# Patient Record
Sex: Male | Born: 1965 | Race: White | Hispanic: No | Marital: Married | Smoking: Former smoker
Health system: Southern US, Community
[De-identification: ages and names within clinical notes are randomized; demographics above are authoritative.]

## PROBLEM LIST (undated history)

## (undated) DIAGNOSIS — K219 Gastro-esophageal reflux disease without esophagitis: Secondary | ICD-10-CM

## (undated) DIAGNOSIS — I2510 Atherosclerotic heart disease of native coronary artery without angina pectoris: Secondary | ICD-10-CM

## (undated) DIAGNOSIS — M5126 Other intervertebral disc displacement, lumbar region: Secondary | ICD-10-CM

## (undated) DIAGNOSIS — R0600 Dyspnea, unspecified: Secondary | ICD-10-CM

## (undated) DIAGNOSIS — J189 Pneumonia, unspecified organism: Secondary | ICD-10-CM

## (undated) HISTORY — PX: CARDIAC CATHETERIZATION: SHX172

## (undated) HISTORY — DX: Gastro-esophageal reflux disease without esophagitis: K21.9

## (undated) HISTORY — PX: TONSILLECTOMY: SUR1361

---

## 2005-09-29 ENCOUNTER — Emergency Department (HOSPITAL_COMMUNITY): Admission: EM | Admit: 2005-09-29 | Discharge: 2005-09-29 | Payer: Self-pay | Admitting: Emergency Medicine

## 2006-08-22 ENCOUNTER — Emergency Department (HOSPITAL_COMMUNITY): Admission: EM | Admit: 2006-08-22 | Discharge: 2006-08-22 | Payer: Self-pay | Admitting: Emergency Medicine

## 2010-06-17 NOTE — Consult Note (Signed)
NAME:  Erik [name], Erik [name] NO.:  [id number]   MEDICAL RECORD NO.:  [id number]          PATIENT TYPE:  EMS   LOCATION:  MINO                         FACILITY:  [facility]   PHYSICIAN:  Karol T. Lazarus Salines, M.D. DATE OF BIRTH:  1965/12/19   DATE OF CONSULTATION:  09/29/2005  DATE OF DISCHARGE:  09/29/2005                                   CONSULTATION   CHIEF COMPLAINT:  Tooth and chin pain and swelling.   HISTORY:  This 45 year old white male began developing some discomfort in a  left lower anterior tooth approximately 2 weeks ago.  He had some swelling  along the chin.  This seemed to settle down.  Then, the pain in the tooth  increased.  He saw his family dentist who put him on penicillin and  recommended he come back next week for evaluation.  Apparently she drilled  through the lingual surface to try to drain the tooth.  Over the past 5  days, he has had increasing pain, swelling in the submental area and  presented this morning to Dr. Garner Nash office in [place] who diagnosed an  abscess.  He was sent to [name] for evaluation at [facility] for possible  incision and drainage.  He has had previous bad teeth including abscesses  which required dental extraction.  He is not diabetic or otherwise immune  compromised.  No other teeth are recurrently bothering him.  No fever.  No  other active medical conditions.   PAST MEDICAL HISTORY:  No known allergies.   He is on penicillin and Vicodin.   SOCIAL HISTORY:  He is married.  He works as an Personnel officer.   FAMILY HISTORY:  Noncontributory.   REVIEW OF SYSTEMS:  Noncontributory.   PHYSICAL EXAMINATION:  This is a trim, somewhat distressed-appearing, middle-  aged white male.  He has visible swelling of the submental area.  No  thickness or swelling of the tongue or elevation of the floor of mouth.  Mental status is appropriate.  He hears well in conversational speech.  The  head is atraumatic and neck supple.  Cranial nerves  intact.  The oral cavity  is moist with a missing teeth and remaining teeth in fair to good repair.  He has some tender swelling in the anterior gingival buccal sulcus  inferiorly.  He has some fluctuant swelling in the submental region to the  left of midline.  No neck adenopathy.   X-RAYS:  A Panorex shows a periapical abscess in tooth #25, the left  mandibular canine.   CT scan shows a lucent abscess anterior to the mandible and a small defect  in the anterior plate of the mandibular bone consistent with erosion of the  abscess into the soft tissues.   IMPRESSION:  Odontogenic abscess, left submental from tooth #25.   PLAN:  I agree with the choice of penicillin.  He needs incision and  drainage today.  I will cover him with additional Vicodin as needed.   With informed consent, I anesthetized the gingival buccal sulcus with  Hurricaine spray, following which I performed  inferior mental nerve blocks  in each side with 6 mL total of  2% Xylocaine with 1:200,000 epinephrine.  After allowing sometime for this to work, additional of the same agent was  infiltrated around the abscess, and into the gingival buccal sulcus directly  beneath the tooth.  Several minutes were allowed for this to take effect.  A  sterile preparation and draping of the external chin was accomplished.   After ascertaining adequate anesthesia, a 1 cm incision into the abscess  pocket was performed.  Approximately 5 mL of frank pus was evacuated.  The  abscess was thoroughly explored up to the mandible, and all loculations were  not broken and expressed.  The patient tolerated this nicely.  A 1/4-inch  Penrose drain was placed in the depth of the abscess and secured to the skin  with a 3-0 silk stitch.  A small absorbent dressing was applied.   The patient is okay to go home.  He will call me in 2 days if he is not  making decent progress at which point it may be appropriate to switch him  from penicillin to  clindamycin.  He needs to contact his dentist immediately  after the holiday weekend for attention to the abscessed tooth.  He  understands and agrees with the discussion and plans.      Gloris Manchester. Lazarus Salines, M.D.  Electronically Signed     KTW/MEDQ  D:  09/29/2005  T:  10/01/2005  Job:  [id number]   cc:   Dr. Lyanne Co  Dr. Freida Busman, D.D.S., [facility]

## 2011-03-09 ENCOUNTER — Emergency Department (HOSPITAL_COMMUNITY)
Admission: EM | Admit: 2011-03-09 | Discharge: 2011-03-09 | Disposition: A | Discharge status: Home / Self Care | Payer: PRIVATE HEALTH INSURANCE | Attending: Emergency Medicine | Admitting: Emergency Medicine

## 2011-03-09 ENCOUNTER — Encounter (HOSPITAL_COMMUNITY): Payer: Self-pay | Admitting: Emergency Medicine

## 2011-03-09 DIAGNOSIS — Z202 Contact with and (suspected) exposure to infections with a predominantly sexual mode of transmission: Secondary | ICD-10-CM | POA: Insufficient documentation

## 2011-03-09 MED ORDER — METRONIDAZOLE 500 MG PO TABS
2000.0000 mg | ORAL_TABLET | Freq: Once | ORAL | Status: AC
  Administered 2011-03-09: 2000 mg via ORAL
  Filled 2011-03-09: qty 4

## 2011-03-09 NOTE — ED Provider Notes (Signed)
History     CSN: [id number]  Arrival date & time 03/09/11  4098   First MD Initiated Contact with Patient 03/09/11 1906      Chief Complaint  Patient presents with  . Exposure to STD    (Consider location/radiation/quality/duration/timing/severity/associated sxs/prior treatment) Patient is a 46 y.o. male presenting with STD exposure. The history is provided by the patient. No language interpreter was used.  Exposure to STD This is a new problem. Associated symptoms comments: Pt and his wife have been married for 21 years.  She was  Recently diagnosed with trichomonas.  He would like to be tested.  i offered the treatment only and he agrees he would prefer that..    History reviewed. No pertinent past medical history.  Past Surgical History  Procedure Date  . Tonsillectomy     No family history on file.  History  Substance Use Topics  . Smoking status: Current Everyday Smoker  . Smokeless tobacco: Not on file  . Alcohol Use: No      Review of Systems  Genitourinary: Negative for dysuria, urgency, frequency, hematuria, flank pain, decreased urine volume, discharge, penile swelling, scrotal swelling, genital sores, penile pain and testicular pain.  All other systems reviewed and are negative.    Allergies  Review of patient's allergies indicates no known allergies.  Home Medications  No current outpatient prescriptions on file.  BP 137/95  Pulse 84  Temp(Src) 98.4 F (36.9 C) (Oral)  Resp 18  SpO2 99%  Physical Exam  Constitutional: He is oriented to person, place, and time. He appears well-developed and well-nourished.  HENT:  Head: Normocephalic.  Eyes: EOM are normal.  Neck: Normal range of motion.  Pulmonary/Chest: Effort normal.  Genitourinary: Testes normal and penis normal. Circumcised. No phimosis, paraphimosis, hypospadias, penile erythema or penile tenderness. No discharge found.  Musculoskeletal: Normal range of motion.  Neurological: He is  alert and oriented to person, place, and time.  Psychiatric: He has a normal mood and affect.    ED Course  Procedures (including critical care time)  Labs Reviewed - No data to display No results found.   No diagnosis found.    MDM          Worthy Rancher, PA 03/09/11 1921

## 2011-03-09 NOTE — ED Notes (Signed)
Pt here for std check. Known exposure to trichomonas. Asymptomatic.

## 2011-03-12 NOTE — ED Provider Notes (Signed)
Medical screening examination/treatment/procedure(s) were performed by non-physician practitioner and as supervising physician I was immediately available for consultation/collaboration.  Nicoletta Dress. Colon Branch, MD 03/12/11 2152

## 2011-05-07 ENCOUNTER — Emergency Department (HOSPITAL_COMMUNITY)
Admission: EM | Admit: 2011-05-07 | Discharge: 2011-05-07 | Disposition: A | Discharge status: Home / Self Care | Payer: Worker's Compensation | Attending: Emergency Medicine | Admitting: Emergency Medicine

## 2011-05-07 ENCOUNTER — Encounter (HOSPITAL_COMMUNITY): Payer: Self-pay

## 2011-05-07 ENCOUNTER — Emergency Department (HOSPITAL_COMMUNITY): Payer: Worker's Compensation

## 2011-05-07 DIAGNOSIS — F17200 Nicotine dependence, unspecified, uncomplicated: Secondary | ICD-10-CM | POA: Insufficient documentation

## 2011-05-07 DIAGNOSIS — M79609 Pain in unspecified limb: Secondary | ICD-10-CM | POA: Insufficient documentation

## 2011-05-07 DIAGNOSIS — Y9269 Other specified industrial and construction area as the place of occurrence of the external cause: Secondary | ICD-10-CM | POA: Insufficient documentation

## 2011-05-07 DIAGNOSIS — S20229A Contusion of unspecified back wall of thorax, initial encounter: Secondary | ICD-10-CM | POA: Insufficient documentation

## 2011-05-07 DIAGNOSIS — R269 Unspecified abnormalities of gait and mobility: Secondary | ICD-10-CM | POA: Insufficient documentation

## 2011-05-07 DIAGNOSIS — M25559 Pain in unspecified hip: Secondary | ICD-10-CM | POA: Insufficient documentation

## 2011-05-07 DIAGNOSIS — W11XXXA Fall on and from ladder, initial encounter: Secondary | ICD-10-CM | POA: Insufficient documentation

## 2011-05-07 HISTORY — DX: Other intervertebral disc displacement, lumbar region: M51.26

## 2011-05-07 MED ORDER — OXYCODONE-ACETAMINOPHEN 5-325 MG PO TABS
1.0000 | ORAL_TABLET | Freq: Once | ORAL | Status: AC
  Administered 2011-05-07: 1 via ORAL
  Filled 2011-05-07: qty 1

## 2011-05-07 MED ORDER — IBUPROFEN 800 MG PO TABS: 800.0000 mg | ORAL_TABLET | Freq: Three times a day (TID) | ORAL | Status: AC

## 2011-05-07 MED ORDER — METHOCARBAMOL 500 MG PO TABS: ORAL_TABLET | ORAL | Status: DC

## 2011-05-07 MED ORDER — OXYCODONE-ACETAMINOPHEN 5-325 MG PO TABS: 1.0000 | ORAL_TABLET | ORAL | Status: AC | PRN

## 2011-05-07 NOTE — ED Notes (Signed)
Pt states has left leg and hip pain after a 4 ft fall from ladder while at work Tuesday (05-02-2011). Pain has increased over the past several days making sleep unbearable. Pt denies numbness and tingling in fore-mentioned leg.

## 2011-05-07 NOTE — ED Notes (Signed)
Was at work last Tuesday and fell app/ 4 feet off ladder, left hip/leg was sore and since Friday having severe pain, unable to sleep at night.

## 2011-05-07 NOTE — ED Provider Notes (Signed)
History     CSN: [id number]  Arrival date & time 05/07/11  0947   First MD Initiated Contact with Patient 05/07/11 [phone]      Chief Complaint  Patient presents with  . Leg Pain    (Consider location/radiation/quality/duration/timing/severity/associated sxs/prior treatment) HPI Comments: Patient complains fall on  04/26/11.  States injury occurred at work when he fell approx 4 ft, landed on his left hip and upper leg.  Pain with weight bearing and certain positions.  Denies numbness, weakness or incontinence of bowel or urine.    Patient is a 46 y.o. male presenting with leg pain. The history is provided by the patient.  Leg Pain  The incident occurred more than 2 days ago. The incident occurred at work. The injury mechanism was a fall. The pain is present in the left hip and left thigh (low back). The quality of the pain is described as aching. The pain is moderate. The pain has been constant since onset. Pertinent negatives include no numbness, no inability to bear weight, no loss of motion, no loss of sensation and no tingling. He reports no foreign bodies present. The symptoms are aggravated by activity, bearing weight and palpation. He has tried NSAIDs for the symptoms. The treatment provided mild relief.    Past Medical History  Diagnosis Date  . Ruptured lumbar intervertebral disc     Past Surgical History  Procedure Date  . Tonsillectomy     History reviewed. No pertinent family history.  History  Substance Use Topics  . Smoking status: Current Everyday Smoker    Types: Cigarettes  . Smokeless tobacco: Not on file  . Alcohol Use: No      Review of Systems  Constitutional: Negative for fever.  Gastrointestinal: Negative for vomiting and abdominal pain.  Genitourinary: Negative for dysuria, frequency, flank pain, decreased urine volume, difficulty urinating and testicular pain.  Musculoskeletal: Positive for back pain and gait problem. Negative for joint swelling.   Skin: Negative.   Neurological: Negative for tingling, weakness and numbness.  All other systems reviewed and are negative.    Allergies  Review of patient's allergies indicates no known allergies.  Home Medications   Current Outpatient Rx  Name Route Sig Dispense Refill  . IBUPROFEN 200 MG PO TABS Oral Take 400 mg by mouth as needed. For pain    . IBUPROFEN 800 MG PO TABS Oral Take 1 tablet (800 mg total) by mouth 3 (three) times daily. 21 tablet 0  . METHOCARBAMOL 500 MG PO TABS  Take two tabs po TID prn muscle spasms 42 tablet 0  . OXYCODONE-ACETAMINOPHEN 5-325 MG PO TABS Oral Take 1 tablet by mouth every 4 (four) hours as needed for pain. 20 tablet 0    BP 120/78  Pulse 83  Temp(Src) 97.7 F (36.5 C) (Oral)  Resp 20  Ht 5\' 8"  (1.727 m)  Wt 160 lb (72.576 kg)  BMI 24.33 kg/m2  SpO2 99%  Physical Exam  Nursing note and vitals reviewed. Constitutional: He is oriented to person, place, and time. He appears well-developed and well-nourished. No distress.  HENT:  Head: Normocephalic and atraumatic.  Mouth/Throat: Oropharynx is clear and moist.  Neck: Normal range of motion. Neck supple.  Cardiovascular: Normal rate, regular rhythm, normal heart sounds and intact distal pulses.   No murmur heard. Pulmonary/Chest: Effort normal and breath sounds normal. No respiratory distress.  Abdominal: Soft. He exhibits no distension. There is no tenderness.  Musculoskeletal: Normal range of motion. He exhibits  tenderness. He exhibits no edema.       Left hip: He exhibits tenderness and bony tenderness. He exhibits normal range of motion, normal strength, no swelling, no crepitus, no deformity and no laceration.       Legs:      ttp of the left lumbar paraspinal muscles and lateral left hip.  Mild pain with rotation of the hip.  dp pulse intact.  No knee pain.  Calf NT  Lymphadenopathy:    He has no cervical adenopathy.  Neurological: He is alert and oriented to person, place, and  time. No cranial nerve deficit or sensory deficit. He exhibits normal muscle tone. Coordination and gait normal.  Reflex Scores:      Patellar reflexes are 2+ on the right side and 2+ on the left side.      Achilles reflexes are 2+ on the right side and 2+ on the left side. Skin: Skin is warm and dry.    ED Course  Procedures (including critical care time)  Labs Reviewed - No data to display Dg Lumbar Spine Complete  05/07/2011  *RADIOLOGY REPORT*  Clinical Data: Status post fall from ladder.  Leg pain  LUMBAR SPINE - COMPLETE 4+ VIEW  Comparison: None  Findings:   There is a mild curvature of the lumbar spine which is convex to the right.  The vertebral body heights are well preserved.  There is disc space narrowing and ventral spurring at L4-5 and L5 S1.  There are no compression fractures identified.  IMPRESSION:  1.  No acute findings. 2.  Lumbar degenerative disc disease.  Original Report Authenticated By: Rosealee Albee, M.D.   Dg Hip Complete Left  05/07/2011  *RADIOLOGY REPORT*  Clinical Data: Leg pain post fall in March  LEFT HIP - COMPLETE 2+ VIEW  Comparison: None  Findings: Three views of the left hip submitted.  No acute fracture or subluxation.  Bilateral hip joints are symmetrical in appearance.  IMPRESSION: No acute fracture or subluxation.  Original Report Authenticated By: Natasha Mead, M.D.     1. Contusion, back       MDM    Patient has tenderness to palpation of the left lumbar paraspinal muscles and left lateral hip. Patient has full range of motion of the hip joint. He ambulates with a steady gait. No focal neuro deficits on his exam.    Patient / Family / Caregiver understand and agree with initial ED impression and plan with expectations set for ED visit. Pt stable in ED with no significant deterioration in condition. Pt feels improved after observation and/or treatment in ED.    Kaeden Mester L. [place], [place] 05/10/11 1634

## 2011-05-12 NOTE — ED Provider Notes (Signed)
Medical screening examination/treatment/procedure(s) were performed by non-physician practitioner and as supervising physician I was immediately available for consultation/collaboration.   Jameek Bruntz L Calistro Rauf, MD 05/12/11 1810 

## 2013-11-18 ENCOUNTER — Encounter (HOSPITAL_COMMUNITY): Payer: Self-pay | Admitting: Emergency Medicine

## 2013-11-18 ENCOUNTER — Emergency Department (HOSPITAL_COMMUNITY)
Admission: EM | Admit: 2013-11-18 | Discharge: 2013-11-18 | Disposition: A | Discharge status: Home / Self Care | Payer: BC Managed Care – PPO | Attending: Emergency Medicine | Admitting: Emergency Medicine

## 2013-11-18 DIAGNOSIS — Z720 Tobacco use: Secondary | ICD-10-CM | POA: Insufficient documentation

## 2013-11-18 DIAGNOSIS — R21 Rash and other nonspecific skin eruption: Secondary | ICD-10-CM | POA: Diagnosis present

## 2013-11-18 DIAGNOSIS — B86 Scabies: Secondary | ICD-10-CM | POA: Diagnosis not present

## 2013-11-18 MED ORDER — PERMETHRIN 5 % EX CREA: TOPICAL_CREAM | CUTANEOUS | Status: DC

## 2013-11-18 NOTE — ED Notes (Signed)
Scattered red itching rash for 1-2 weeks, Exposed to scabies

## 2013-11-18 NOTE — ED Provider Notes (Signed)
CSN: [id number]     Arrival date & time 11/18/13  1116 History  This chart was scribed for non-physician practitioner Lenard Galloway, working with Sharyon Cable, MD by Ludger Nutting, ED Scribe. This patient was seen in room APFT23/APFT23 and the patient's care was started at 11:58 AM.  .  Chief Complaint  Patient presents with  . Rash   The history is provided by the patient. No language interpreter was used.    HPI Comments: Erik [name] is a 48 y.o. male who presents to the Emergency Department complaining of 4-5 days of gradual onset, gradually worsened, constant rash to the lower extremities which improved and has now progressed to the back and upper extremities. He has associated itching. He has not tried any OTC medications for his symptoms. He denies fever.  Past Medical History  Diagnosis Date  . Ruptured lumbar intervertebral disc    Past Surgical History  Procedure Laterality Date  . Tonsillectomy     History reviewed. No pertinent family history. History  Substance Use Topics  . Smoking status: Current Every Day Smoker    Types: Cigarettes  . Smokeless tobacco: Not on file  . Alcohol Use: No    Review of Systems  Constitutional: Negative for fever.  Skin: Positive for rash.      Allergies  Review of patient's allergies indicates no known allergies.  Home Medications   Prior to Admission medications   Medication Sig Start Date End Date Taking? Authorizing Provider  ibuprofen (ADVIL,MOTRIN) 200 MG tablet Take 400 mg by mouth as needed. For pain    Historical Provider, MD  methocarbamol (ROBAXIN) 500 MG tablet Take two tabs po TID prn muscle spasms 05/07/11   Tammy L. Triplett, PA-C   BP 139/99  Pulse 69  Temp(Src) 98.2 F (36.8 C) (Oral)  Resp 18  Ht 5\' 9"  (1.753 m)  Wt 160 lb (72.576 kg)  BMI 23.62 kg/m2  SpO2 100% Physical Exam  Nursing note and vitals reviewed. Constitutional: He is oriented to person, place, and time. He appears  well-developed and well-nourished.  HENT:  Head: Normocephalic and atraumatic.  Cardiovascular: Normal rate.   Pulmonary/Chest: Effort normal.  Neurological: He is alert and oriented to person, place, and time.  Skin: Skin is warm and dry. Rash noted. Rash is maculopapular.  Maculopapular rash to back and LUE.  Psychiatric: He has a normal mood and affect.    ED Course  Procedures (including critical care time)  DIAGNOSTIC STUDIES: Oxygen Saturation is 20% on RA, normal by my interpretation.    COORDINATION OF CARE: 12:00 AM Will discharge home with permethrin cream. Discussed treatment plan with pt at bedside and pt agreed to plan.   Labs Review Labs Reviewed - No data to display  Imaging Review No results found.   EKG Interpretation None      MDM   Final diagnoses:  None    2. Scabies  Characteristic rash of scabies with known home exposure with family member. Will treat with permethrin and in home care instruction.  I personally performed the services described in this documentation, which was scribed in my presence. The recorded information has been reviewed and is accurate.     Dewaine Oats, PA-C 11/18/13 1514

## 2013-11-18 NOTE — Discharge Instructions (Signed)

## 2013-11-19 NOTE — ED Provider Notes (Signed)
Medical screening examination/treatment/procedure(s) were performed by non-physician practitioner and as supervising physician I was immediately available for consultation/collaboration.   EKG Interpretation None        Elyssa Pendelton W Jannie Doyle, MD 11/19/13 0718 

## 2017-04-05 ENCOUNTER — Encounter (HOSPITAL_COMMUNITY): Payer: Self-pay

## 2017-04-05 ENCOUNTER — Emergency Department (HOSPITAL_COMMUNITY): Payer: Self-pay

## 2017-04-05 ENCOUNTER — Emergency Department (HOSPITAL_COMMUNITY)
Admission: EM | Admit: 2017-04-05 | Discharge: 2017-04-05 | Disposition: A | Discharge status: Home / Self Care | Payer: Self-pay | Attending: Emergency Medicine | Admitting: Emergency Medicine

## 2017-04-05 DIAGNOSIS — S29011A Strain of muscle and tendon of front wall of thorax, initial encounter: Secondary | ICD-10-CM | POA: Insufficient documentation

## 2017-04-05 DIAGNOSIS — X509XXA Other and unspecified overexertion or strenuous movements or postures, initial encounter: Secondary | ICD-10-CM | POA: Insufficient documentation

## 2017-04-05 DIAGNOSIS — Y998 Other external cause status: Secondary | ICD-10-CM | POA: Insufficient documentation

## 2017-04-05 DIAGNOSIS — F17210 Nicotine dependence, cigarettes, uncomplicated: Secondary | ICD-10-CM | POA: Insufficient documentation

## 2017-04-05 DIAGNOSIS — R0781 Pleurodynia: Secondary | ICD-10-CM

## 2017-04-05 DIAGNOSIS — Y9389 Activity, other specified: Secondary | ICD-10-CM | POA: Insufficient documentation

## 2017-04-05 DIAGNOSIS — I2510 Atherosclerotic heart disease of native coronary artery without angina pectoris: Secondary | ICD-10-CM | POA: Insufficient documentation

## 2017-04-05 DIAGNOSIS — Y92810 Car as the place of occurrence of the external cause: Secondary | ICD-10-CM | POA: Insufficient documentation

## 2017-04-05 HISTORY — DX: Atherosclerotic heart disease of native coronary artery without angina pectoris: I25.10

## 2017-04-05 MED ORDER — METHOCARBAMOL 500 MG PO TABS: 500.0000 mg | ORAL_TABLET | Freq: Every evening | ORAL | 0 refills | Status: DC | PRN

## 2017-04-05 MED ORDER — CYCLOBENZAPRINE HCL 10 MG PO TABS
10.0000 mg | ORAL_TABLET | Freq: Once | ORAL | Status: AC
  Administered 2017-04-05: 10 mg via ORAL
  Filled 2017-04-05: qty 1

## 2017-04-05 MED ORDER — DICLOFENAC SODIUM 1 % TD GEL: 2.0000 g | Freq: Four times a day (QID) | TRANSDERMAL | 0 refills | Status: DC

## 2017-04-05 NOTE — ED Provider Notes (Signed)
Patient placed in Quick Look pathway, seen and evaluated   Chief Complaint: left rib pain  HPI:   52 y.o. male here today with pain in the left rib area that started one week ago. Patient reports that he was leaning over his toolbox on the back of his truck and felt a sudden severe pain. He states he did not hit the rib but felt the severe pain. Patient reports taking tylenol and ibuprofen but the pain has gotten worse and he has been unable to sleep due to the pain.   ROS: M/S: left rib pain  Physical Exam:  BP (!) 138/95 (BP Location: Right Arm)   Pulse 61   Temp (!) 97.5 F (36.4 C) (Oral)   Resp 14   Ht 5' 8.5" (1.74 m)   Wt 70.3 kg (155 lb)   SpO2 100%   BMI 23.22 kg/m    Gen: No distress  Neuro: Awake and Alert  Skin: Warm and dry, intact, no ecchymosis  Lungs: clear  M/S: tender with palpation to left posterior  ribs.     Focused Exam:    Initiation of care has begun. The patient has been counseled on the process, plan, and necessity for staying for the completion/evaluation, and the remainder of the medical screening examination    Ashley Murrain, NP 04/05/17 1419    Duffy Bruce, MD 04/06/17 [phone]

## 2017-04-05 NOTE — ED Provider Notes (Signed)
[facility] Provider Note   CSN: [id number] Arrival date & time: 04/05/17  1358     History   Chief Complaint No chief complaint on file.   HPI Erik [name] is a 52 y.o. male with past medical history significant for CAD and stent placement as well as rupture of lumbar intervertebral disc presenting with sudden onset left lower mid axillary rib nonradiating pain after reaching in the back of his trunk over a toolbox 1 week ago.  The pain is sharp and aggravated by movement alleviated slightly by rest, patient reports that the pain is relatively constant and worse when the is lying down to sleep.  He has tried ibuprofen and Tylenol without relief.  Denies any chest pain, shortness of breath, pleuritic pain, cough, history of DVT/PE, recent surgery, prolonged ambulation.   HPI  Past Medical History:  Diagnosis Date  . Coronary artery disease   . Ruptured lumbar intervertebral disc     There are no active problems to display for this patient.   Past Surgical History:  Procedure Laterality Date  . CARDIAC CATHETERIZATION     with stent placement  . TONSILLECTOMY         Home Medications    Prior to Admission medications   Medication Sig Start Date End Date Taking? Authorizing Provider  diclofenac sodium (VOLTAREN) 1 % GEL Apply 2 g topically 4 (four) times daily. 04/05/17   Avie Echevaria B, PA-C  ibuprofen (ADVIL,MOTRIN) 200 MG tablet Take 400 mg by mouth as needed. For pain    [provider]  methocarbamol (ROBAXIN) 500 MG tablet Take 1 tablet (500 mg total) by mouth at bedtime as needed. 04/05/17   Emeline General, PA-C  permethrin (ELIMITE) 5 % cream Apply from neck down at night and wash off in the morning 11/18/13   Charlann Lange, PA-C    Family History No family history on file.  Social History Social History   Tobacco Use  . Smoking status: Current Every Day Smoker    Types: Cigarettes  . Smokeless  tobacco: Never Used  Substance Use Topics  . Alcohol use: No  . Drug use: No     Allergies   Patient has no known allergies.   Review of Systems Review of Systems  Constitutional: Negative for chills, diaphoresis, fatigue and fever.  HENT: Negative for congestion.   Respiratory: Negative for cough, choking, chest tightness, shortness of breath, wheezing and stridor.   Cardiovascular: Negative for chest pain, palpitations and leg swelling.       Left mid axillary lower rib pain  Gastrointestinal: Negative for abdominal pain, diarrhea, nausea and vomiting.  Musculoskeletal: Negative for arthralgias, back pain, gait problem, joint swelling, myalgias, neck pain and neck stiffness.  Skin: Negative for color change, pallor, rash and wound.  Neurological: Negative for dizziness, syncope, weakness, light-headedness, numbness and headaches.     Physical Exam Updated Vital Signs BP (!) 138/95 (BP Location: Right Arm)   Pulse 61   Temp (!) 97.5 F (36.4 C) (Oral)   Resp 14   Ht 5' 8.5" (1.74 m)   Wt 70.3 kg (155 lb)   SpO2 100%   BMI 23.22 kg/m   Physical Exam  Constitutional: He appears well-developed and well-nourished. No distress.  Afebrile, nontoxic-appearing, sitting comfortably in chair in no acute distress.  HENT:  Head: Normocephalic and atraumatic.  Eyes: Conjunctivae and EOM are normal.  Neck: Normal range of motion.  Cardiovascular: Normal rate, regular  rhythm, normal heart sounds and intact distal pulses.  No murmur heard. Pulmonary/Chest: Effort normal and breath sounds normal. No stridor. No respiratory distress. He has no wheezes. He has no rales. He exhibits no tenderness.  Abdominal: He exhibits no distension.  Musculoskeletal: Normal range of motion. He exhibits tenderness. He exhibits no edema or deformity.  Tender to palpation in the intercostals of the left mid axillary 10th through 12th ribs  Neurological: He is alert. He exhibits normal muscle tone.    Skin: Skin is warm and dry. No rash noted. He is not diaphoretic. No erythema. No pallor.  Psychiatric: He has a normal mood and affect.  Nursing note and vitals reviewed.    ED Treatments / Results  Labs (all labs ordered are listed, but only abnormal results are displayed) Labs Reviewed - No data to display  EKG  EKG Interpretation None       Radiology Dg Chest 2 View  Result Date: 04/05/2017 CLINICAL DATA:  LEFT side rib pain for 1 week, felt a popping sensation when leaning over the truck bed last week, history smoking, coronary disease post coronary stenting and MI EXAM: CHEST - 2 VIEW COMPARISON:  None. FINDINGS: Minimal enlargement of cardiac silhouette. Atherosclerotic calcification aorta. Mediastinal contours and pulmonary vascularity normal. Mild bronchitic changes with slight chronic interstitial prominence, uncertain acuity. No segmental consolidation, pleural effusion or pneumothorax. Bones appear demineralized without acute osseous findings. Small bony excrescence identified at the proximal LEFT humerus question osteo chondroma. IMPRESSION: Minimal bronchitic changes and interstitial prominence of uncertain acuity. No acute LEFT rib abnormalities. Electronically Signed   By: Lavonia Dana M.D.   On: 04/05/2017 14:40    Procedures Procedures (including critical care time)  Medications Ordered in ED Medications  cyclobenzaprine (FLEXERIL) tablet 10 mg (10 mg Oral Given 04/05/17 1419)     Initial Impression / Assessment and Plan / ED Course  I have reviewed the triage vital signs and the nursing notes.  Pertinent labs & imaging results that were available during my care of the patient were reviewed by me and considered in my medical decision making (see chart for details).     Patient presenting with 1 week of left lower mid axillary rib pain after over reaching into his trunk.   Pain is reproducible on palpation and aggravated by movement.  No pleuritic component,  chest pain or shortness of breath.  100% SPO2 on room air.  Patient is not hypercoagulable and very low suspicion for PE as etiology of patient's symptoms.  Low suspicion for ACS, no chest pain or SOB and hx findings more consistent with msk etiology.,  No rash or concern for zoster  Chest xray without acute abnormalities.  No evidence of fracture, consolidation or pneumonia.  Patient is well-appearing, afebrile nontoxic.  Will discharge home with symptomatic relief and close follow-up with PCP.  Patient was provided with resources.  Discussed strict return precautions and advised to return to the emergency department if experiencing any new or worsening symptoms. Instructions were understood and patient agreed with discharge plan.  Final Clinical Impressions(s) / ED Diagnoses   Final diagnoses:  Rib pain on left side  Intercostal muscle strain, initial encounter    ED Discharge Orders        Ordered    methocarbamol (ROBAXIN) 500 MG tablet  At bedtime PRN     04/05/17 1718    diclofenac sodium (VOLTAREN) 1 % GEL  4 times daily     04/05/17 1720  Emeline General, PA-C 04/05/17 1733    Duffy Bruce, MD 04/06/17 [phone]

## 2017-04-05 NOTE — ED Triage Notes (Signed)
Pt states he was leaning over tool box on back of truck last Thursday and is having pain to left ribcage. Denies SOB.

## 2017-04-05 NOTE — Discharge Instructions (Signed)
As discussed, The medicine prescribed can help with muscle spasm but cannot be taken if driving, with alcohol or operating machinery.  You may take tylenol 1000 mg 4 times daily, make sure that you do not exceed 4000 mg of acetaminophen in a 24-hour period.  Acetaminophen is found and over-the-counter cold medications such as NyQuil, DayQuil and Alka-Seltzer among others. Apply diclofenac gel locally as needed.  Follow up with your Primary care provider or the wellness center if symptoms  persist beyond a week.  Return if worsening or new concerning symptoms in the meantime.

## 2017-04-05 NOTE — ED Notes (Signed)
See EDP secondary assessment.  

## 2019-11-25 ENCOUNTER — Emergency Department (HOSPITAL_COMMUNITY)
Admission: EM | Admit: 2019-11-25 | Discharge: 2019-11-25 | Disposition: A | Discharge status: Home / Self Care | Payer: Self-pay | Attending: Emergency Medicine | Admitting: Emergency Medicine

## 2019-11-25 ENCOUNTER — Emergency Department (HOSPITAL_COMMUNITY): Payer: Self-pay

## 2019-11-25 ENCOUNTER — Encounter (HOSPITAL_COMMUNITY): Payer: Self-pay

## 2019-11-25 ENCOUNTER — Other Ambulatory Visit: Payer: Self-pay

## 2019-11-25 DIAGNOSIS — I2510 Atherosclerotic heart disease of native coronary artery without angina pectoris: Secondary | ICD-10-CM | POA: Insufficient documentation

## 2019-11-25 DIAGNOSIS — F17210 Nicotine dependence, cigarettes, uncomplicated: Secondary | ICD-10-CM | POA: Insufficient documentation

## 2019-11-25 DIAGNOSIS — K219 Gastro-esophageal reflux disease without esophagitis: Secondary | ICD-10-CM | POA: Insufficient documentation

## 2019-11-25 DIAGNOSIS — R490 Dysphonia: Secondary | ICD-10-CM | POA: Insufficient documentation

## 2019-11-25 DIAGNOSIS — R059 Cough, unspecified: Secondary | ICD-10-CM | POA: Insufficient documentation

## 2019-11-25 LAB — CBC WITH DIFFERENTIAL/PLATELET
Abs Immature Granulocytes: 0.01 10*3/uL (ref 0.00–0.07)
Basophils Absolute: 0 10*3/uL (ref 0.0–0.1)
Basophils Relative: 1 %
Eosinophils Absolute: 0.2 10*3/uL (ref 0.0–0.5)
Eosinophils Relative: 3 %
HCT: 42 % (ref 39.0–52.0)
Hemoglobin: 14.1 g/dL (ref 13.0–17.0)
Immature Granulocytes: 0 %
Lymphocytes Relative: 28 %
Lymphs Abs: 2 10*3/uL (ref 0.7–4.0)
MCH: 31.8 pg (ref 26.0–34.0)
MCHC: 33.6 g/dL (ref 30.0–36.0)
MCV: 94.8 fL (ref 80.0–100.0)
Monocytes Absolute: 0.5 10*3/uL (ref 0.1–1.0)
Monocytes Relative: 7 %
Neutro Abs: 4.2 10*3/uL (ref 1.7–7.7)
Neutrophils Relative %: 61 %
Platelets: 338 10*3/uL (ref 150–400)
RBC: 4.43 MIL/uL (ref 4.22–5.81)
RDW: 13.2 % (ref 11.5–15.5)
WBC: 7 10*3/uL (ref 4.0–10.5)
nRBC: 0 % (ref 0.0–0.2)

## 2019-11-25 LAB — COMPREHENSIVE METABOLIC PANEL
ALT: 25 U/L (ref 0–44)
AST: 22 U/L (ref 15–41)
Albumin: 3.8 g/dL (ref 3.5–5.0)
Alkaline Phosphatase: 65 U/L (ref 38–126)
Anion gap: 10 (ref 5–15)
BUN: 18 mg/dL (ref 6–20)
CO2: 27 mmol/L (ref 22–32)
Calcium: 9.3 mg/dL (ref 8.9–10.3)
Chloride: 105 mmol/L (ref 98–111)
Creatinine, Ser: 0.75 mg/dL (ref 0.61–1.24)
GFR, Estimated: >60 mL/min (ref >60)
Glucose, Bld: 91 mg/dL (ref 70–99)
Potassium: 4 mmol/L (ref 3.5–5.1)
Sodium: 142 mmol/L (ref 135–145)
Total Bilirubin: 0.6 mg/dL (ref 0.3–1.2)
Total Protein: 7.2 g/dL (ref 6.5–8.1)

## 2019-11-25 LAB — LIPASE, BLOOD: Lipase: 47 U/L (ref 11–51)

## 2019-11-25 MED ORDER — OMEPRAZOLE 20 MG PO CPDR: 20.0000 mg | DELAYED_RELEASE_CAPSULE | Freq: Two times a day (BID) | ORAL | 1 refills | Status: DC

## 2019-11-25 MED ORDER — DOXYCYCLINE HYCLATE 100 MG PO CAPS: 100.0000 mg | ORAL_CAPSULE | Freq: Two times a day (BID) | ORAL | 0 refills | Status: DC

## 2019-11-25 NOTE — ED Triage Notes (Addendum)
Pt reports he had some nasal congestion back in July and since then his voice has become more and more hoarse. Pt states that more recently he has also started to have chest congestion and experiencing some heartburn. Denies chest pain and tightness.

## 2019-11-25 NOTE — Discharge Instructions (Addendum)
I suspect that your hoarse voice is due to uncontrolled GERD.  Please take the omeprazole, as directed.  I have also prescribed you doxycycline for a developing pneumonia visualized on chest x-ray.  It is vitally important that you get established with a primary care provider for ongoing evaluation and management of your health and wellbeing.  If your laryngitis fails to improve with GERD management, you will require further evaluation.  Return to the ED or seek immediate medical attention should you experience any new or worsening symptoms.

## 2019-11-25 NOTE — ED Provider Notes (Signed)
[facility] DEPT Provider Note   CSN: [id number] Arrival date & time: 11/25/19  4970     History Chief Complaint  Patient presents with  . Hoarse  . Nasal Congestion    Erik [name] is a 54 y.o. male with history of ACS s/p stent placement 4 years ago who presents the ED accompanied by his wife with a 9-month history of hoarseness.  Patient endorses a 35-pack-year smoking history, but does not see a primary care provider.  He states that he does not yet have medical insurance.  His wife states that he has been coughing worse as of lately.  He also states that he has been experiencing a 35-month history of central chest "burning" sensation that radiates from his epigastric towards his throat.  He denies any hemoptysis.  His wife remarks that he has had intermittent night sweats and mild weight loss.  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  Patient has not had a colonoscopy or any other health screenings.  He drinks alcohol rarely and does not taking medications unless absolutely necessary.  He denies any melena or other changes in bowel habits.  Denies abdominal pain, nausea or vomiting, or fevers.  No exertional chest pain or shortness of breath symptoms.  HPI     Past Medical History:  Diagnosis Date  . Coronary artery disease   . Ruptured lumbar intervertebral disc     There are no problems to display for this patient.   Past Surgical History:  Procedure Laterality Date  . CARDIAC CATHETERIZATION     with stent placement  . TONSILLECTOMY         History reviewed. No pertinent family history.  Social History   Tobacco Use  . Smoking status: Current Every Day Smoker    Types: Cigarettes  . Smokeless tobacco: Never Used  Substance Use Topics  . Alcohol use: No  . Drug use: No    Home Medications Prior to Admission medications   Medication Sig Start Date End Date Taking?  Authorizing Provider  diclofenac sodium (VOLTAREN) 1 % GEL Apply 2 g topically 4 (four) times daily. 04/05/17   Emeline General, PA-C  doxycycline (VIBRAMYCIN) 100 MG capsule Take 1 capsule (100 mg total) by mouth 2 (two) times daily. 11/25/19   Corena Herter, PA-C  ibuprofen (ADVIL,MOTRIN) 200 MG tablet Take 400 mg by mouth as needed. For pain    [provider]  methocarbamol (ROBAXIN) 500 MG tablet Take 1 tablet (500 mg total) by mouth at bedtime as needed. 04/05/17   Emeline General, PA-C  omeprazole (PRILOSEC) 20 MG capsule Take 1 capsule (20 mg total) by mouth 2 (two) times daily before a meal. 11/25/19 12/25/19  Nyoka Cowden, Rowe Clack, PA-C  permethrin (ELIMITE) 5 % cream Apply from neck down at night and wash off in the morning 11/18/13   Charlann Lange, PA-C    Allergies    Patient has no known allergies.  Review of Systems   Review of Systems  All other systems reviewed and are negative.   Physical Exam Updated Vital Signs BP 139/89   Pulse 62   Temp 98.1 F (36.7 C) (Oral)   Resp 18   SpO2 97%   Physical Exam Vitals and nursing note reviewed. Exam conducted with a chaperone present.  Constitutional:      General: He is not in acute distress.    Comments: Hoarse, mild cough.  HENT:     Head: Normocephalic and atraumatic.     Mouth/Throat:     Comments: Patent oropharynx.  No uvular deviation or erythema noted.  No exudates.  No trismus.  Tolerating secretions. Eyes:     General: No scleral icterus.    Conjunctiva/sclera: Conjunctivae normal.  Neck:     Comments: No obvious masses appreciated.  No supraclavicular lymphadenopathy. Cardiovascular:     Rate and Rhythm: Normal rate and regular rhythm.     Pulses: Normal pulses.     Heart sounds: Normal heart sounds.  Pulmonary:     Comments: Very mild increased work of breathing.  CTA bilaterally.  No tachypnea or distress.  Accessory muscle use. Abdominal:     General: Abdomen is flat. There is no  distension.     Palpations: Abdomen is soft.     Tenderness: There is no abdominal tenderness.  Musculoskeletal:     Cervical back: Normal range of motion and neck supple. No rigidity.  Skin:    General: Skin is dry.     Capillary Refill: Capillary refill takes less than 2 seconds.  Neurological:     Mental Status: He is alert and oriented to person, place, and time.     GCS: GCS eye subscore is 4. GCS verbal subscore is 5. GCS motor subscore is 6.  Psychiatric:        Mood and Affect: Mood normal.        Behavior: Behavior normal.        Thought Content: Thought content normal.     ED Results / Procedures / Treatments   Labs (all labs ordered are listed, but only abnormal results are displayed) Labs Reviewed  CBC WITH DIFFERENTIAL/PLATELET  COMPREHENSIVE METABOLIC PANEL  LIPASE, BLOOD    EKG None  Radiology DG Chest 2 View  Result Date: 11/25/2019 CLINICAL DATA:  Cough EXAM: CHEST - 2 VIEW COMPARISON:  April 05, 2017 FINDINGS: The heart size and mediastinal contours are within normal limits. Subtle opacities in the right lower lung on the frontal radiograph. No pleural effusions or pneumothorax. The visualized skeletal structures are unremarkable. Aortic atherosclerosis. IMPRESSION: Subtle opacities in the right lower lung on the frontal radiograph, which may represent early pneumonia. Electronically Signed   By: Margaretha Sheffield MD   On: 11/25/2019 09:23    Procedures Procedures (including critical care time)  Medications Ordered in ED Medications - No data to display  ED Course  I have reviewed the triage vital signs and the nursing notes.  Pertinent labs & imaging results that were available during my care of the patient were reviewed by me and considered in my medical decision making (see chart for details).    MDM Rules/Calculators/A&P                          Patient is endorsing a "chest burning" sensation that has been persistent x2 months.  He does not  take anything for GERD and his description of symptoms are consistent.  I suspect that this is contributing to his subacute/chronic laryngitis.  He continues to smoke a pack per day cigarettes.  He has a mild increased work of breathing, but denies any shortness of breath symptoms.  He is actively coughing throughout my examination and I suspect that he has COPD.  He does not take any medications regularly and does not have any outpatient support.  He is in the process of getting medical insurance through  his job as an Clinical biochemist.  I emphasized the importance of getting a PCP as he would likely be a good candidate for low-dose CT imaging of his chest given his 30+ pack year smoking history.  He also would benefit from an albuterol rescue inhaler and COPD work-up.  However, for the purpose of today's encounter, will obtain plain films of chest and basic laboratory work-up.  If it is all reassuring, he can be started on a proton pump inhibitor to see if that improves his symptoms.  CT soft tissue not emergently indicated. Discussed with Dr. Alvino Chapel who agrees with assessment and plan.    Laboratory work-up is personally reviewed and without any derangement.  Plain films are reviewed and demonstrates subtle opacities in the right lower lung on the frontal radiograph.  Given his worsening cough symptoms, will treat with doxycycline.  We will also place on Protonix 40 mg daily x3 months.  If his symptoms fail to improve with proton pump inhibitors, he will require further work-up for his subacute/chronic laryngitis.  ED return precautions discussed.  Patient voices understanding and is agreeable to the plan.    Final Clinical Impression(s) / ED Diagnoses Final diagnoses:  Hoarseness  Gastroesophageal reflux disease, unspecified whether esophagitis present    Rx / DC Orders ED Discharge Orders         Ordered    doxycycline (VIBRAMYCIN) 100 MG capsule  2 times daily        11/25/19 1033    omeprazole  (PRILOSEC) 20 MG capsule  2 times daily before meals        11/25/19 1033           Reita Chard 11/25/19 1034    Davonna Belling, MD 11/25/19 1358

## 2020-02-10 ENCOUNTER — Ambulatory Visit
Admission: EM | Admit: 2020-02-10 | Discharge: 2020-02-10 | Disposition: A | Discharge status: Home / Self Care | Payer: 59 | Attending: Internal Medicine | Admitting: Internal Medicine

## 2020-02-10 ENCOUNTER — Encounter: Payer: Self-pay | Admitting: Emergency Medicine

## 2020-02-10 ENCOUNTER — Other Ambulatory Visit: Payer: Self-pay

## 2020-02-10 DIAGNOSIS — R490 Dysphonia: Secondary | ICD-10-CM

## 2020-02-10 DIAGNOSIS — I10 Essential (primary) hypertension: Secondary | ICD-10-CM | POA: Diagnosis not present

## 2020-02-10 NOTE — Discharge Instructions (Signed)
It was nice to meet you today,  We have talked to the primary care office [place].  They will call you to schedule an appointment sometime today.  I have included the number below if you have not heard from them in a couple days.  I would reach out to them.  My concern is that a lot of your complaints could be due to chronic issues that require more extensive testing and diagnosis.  Based on exam and her previous chest x-ray he may have some undiagnosed COPD.  I would continue taking your Prilosec for now.  Address: [address], [place], [place] [address] Phone: [phone]

## 2020-02-10 NOTE — ED Triage Notes (Signed)
Per pt he has been having cough and congestion for quite some time, Pt said cough is nagging and hoarseness, Pt is a big smoker, pt said some SOB

## 2020-02-10 NOTE — ED Provider Notes (Signed)
[facility]    CSN: [id number] Arrival date & time: 02/10/20  [id number]      History   Chief Complaint Chief Complaint  Patient presents with  . Cough    HPI Mccray Angermeier is a 55 y.o. male.    Patient was seen in the ED approximately 3 months ago for chest tightness and hoarseness with cough.  He had an x-ray and blood work done.  He was diagnosed with GERD and given Prilosec.  The chest tightness resolved but he still having hoarseness.  He still has occasional cough that varies from excessive to mild on a day-to-day basis.  He has some sputum that is nonbloody.  He does not currently have a primary care provider.  He had a myocardial infarction in 2017 but has not followed up with a cardiologist.  He had 2 stents placed at the time.  He does not take any other medications.  Has no history of allergies.  He is a smoker of 1 to 1-1/2 packs/day for the past 35 years.     Past Medical History:  Diagnosis Date  . Coronary artery disease   . Ruptured lumbar intervertebral disc     There are no problems to display for this patient.   Past Surgical History:  Procedure Laterality Date  . CARDIAC CATHETERIZATION     with stent placement  . TONSILLECTOMY         Home Medications    Prior to Admission medications   Medication Sig Start Date End Date Taking? Authorizing Provider  diclofenac sodium (VOLTAREN) 1 % GEL Apply 2 g topically 4 (four) times daily. 04/05/17   Emeline General, PA-C  doxycycline (VIBRAMYCIN) 100 MG capsule Take 1 capsule (100 mg total) by mouth 2 (two) times daily. 11/25/19   Corena Herter, PA-C  ibuprofen (ADVIL,MOTRIN) 200 MG tablet Take 400 mg by mouth as needed. For pain    [provider]  methocarbamol (ROBAXIN) 500 MG tablet Take 1 tablet (500 mg total) by mouth at bedtime as needed. 04/05/17   Emeline General, PA-C  omeprazole (PRILOSEC) 20 MG capsule Take 1 capsule (20 mg total) by mouth 2 (two) times daily before a  meal. 11/25/19 12/25/19  Nyoka Cowden, Rowe Clack, PA-C  permethrin (ELIMITE) 5 % cream Apply from neck down at night and wash off in the morning 11/18/13   Charlann Lange, PA-C    Family History History reviewed. No pertinent family history.  Social History Social History   Tobacco Use  . Smoking status: Current Every Day Smoker    Types: Cigarettes  . Smokeless tobacco: Never Used  Substance Use Topics  . Alcohol use: No  . Drug use: No     Allergies   Patient has no known allergies.   Review of Systems Review of Systems  All other systems reviewed and are negative.    Physical Exam Triage Vital Signs ED Triage Vitals [02/10/20 1015]  Enc Vitals Group     BP (!) 176/88     Pulse Rate 97     Resp 18     Temp 98.6 F (37 C)     Temp Source Oral     SpO2 99 %     Weight      Height      Head Circumference      Peak Flow      Pain Score 0     Pain Loc      Pain Edu?  Excl. in [place]?    No data found.  Updated Vital Signs BP (!) 176/88 (BP Location: Right Arm)   Pulse 97   Temp 98.6 F (37 C) (Oral)   Resp 18   SpO2 99%   Visual Acuity Right Eye Distance:   Left Eye Distance:   Bilateral Distance:    Right Eye Near:   Left Eye Near:    Bilateral Near:     Physical Exam Vitals reviewed.  Constitutional:      General: He is not in acute distress.    Appearance: He is normal weight.  HENT:     Head: Normocephalic.     Nose: Nose normal. No congestion or rhinorrhea.     Mouth/Throat:     Mouth: Mucous membranes are moist.     Pharynx: Oropharynx is clear. No posterior oropharyngeal erythema.     Comments: Poor dentition with missing teeth and cavities Eyes:     Pupils: Pupils are equal, round, and reactive to light.  Cardiovascular:     Rate and Rhythm: Normal rate and regular rhythm.     Pulses: Normal pulses.     Heart sounds: Normal heart sounds. No murmur heard.   Pulmonary:     Breath sounds: Wheezing present.     Comments: Patient  with prolonged expiratory phase and diffuse mild wheezing bilaterally.  No areas of consolidation.  No egophony Musculoskeletal:     Cervical back: Normal range of motion.  Lymphadenopathy:     Cervical: No cervical adenopathy.  Skin:    General: Skin is warm and dry.  Neurological:     Mental Status: He is alert.      UC Treatments / Results  Labs (all labs ordered are listed, but only abnormal results are displayed) Labs Reviewed - No data to display  EKG   Radiology No results found.  Procedures Procedures (including critical care time)  Medications Ordered in UC Medications - No data to display  Initial Impression / Assessment and Plan / UC Course  I have reviewed the triage vital signs and the nursing notes.  Pertinent labs & imaging results that were available during my care of the patient were reviewed by me and considered in my medical decision making (see chart for details).    Patient has at least a 29-month history of hoarseness and cough.  Was diagnosed with GERD leading and has been taking Prilosec which has improved chest discomfort but did not help Cough or hoarseness.  Given his extensive smoking history and evaluation of her previous x-ray it appears he has undiagnosed COPD.  He also is hypertensive today at 381 systolic.  Cough may be due to chronic bronchitis/COPD.  Differential for his hoarseness is more broad including laryngeal reflux from GERD, laryngeal spasm, vocal cord polyp or mass.  Patient needs evaluation with endoscopy or ENT referral for vocal cord dysfunction, both of which he needs a PCP for.  Discussed with patient getting a PCP.  He has insurance.  Talk with the primary care office that we share a facility with him and they are willing to see him next week.  Advised patient to continue taking the Prilosec for now. Final Clinical Impressions(s) / UC Diagnoses   Final diagnoses:  Hoarseness  Essential hypertension     Discharge  Instructions     It was nice to meet you today,  We have talked to the primary care office next-door.  They will call you to schedule an appointment  sometime today.  I have included the number below if you have not heard from them in a couple days.  I would reach out to them.  My concern is that a lot of your complaints could be due to chronic issues that require more extensive testing and diagnosis.  Based on exam and her previous chest x-ray he may have some undiagnosed COPD.  I would continue taking your Prilosec for now.  Address: [address] [place], [place] [address] Phone: [phone]    ED Prescriptions    None     PDMP not reviewed this encounter.   Benay Pike, MD 02/10/20 1106

## 2020-02-23 NOTE — Progress Notes (Signed)
Subjective:    Erik [name] - 55 y.o. male MRN [id number]  Date of birth: [birth year]  HPI  Erik [name] is to establish care. Patient has no significant PMH.   Current issues and/or concerns: 1. GERD FOLLOW-UP: 02/10/2020: Visit at [facility] at [facility] Patient with at least a 69-month history of hoarseness and cough. Was diagnosed with GERD leading and was taking Prilosec which improved chest discomfort but did not help cough or hoarseness.  02/24/2020: Today reports he completed Prilosec, no longer having symptoms of acid reflux. Still having cough and hoarseness.   2. COUGH AND HOARSENESS: 02/10/2020: Visit at [facility] at [facility]. Given his extensive smoking history and evaluation of his previous x-ray it appears he has undiagnosed COPD. Differential for his hoarseness is more broad including laryngeal reflux from GERD, laryngeal spasm, vocal cord polyp or mass. Patient needs evaluation with endoscopy or ENT referral for vocal cord dysfunction.  02/24/2020: Duration: months Circumstances of initial development of cough: unknown Cough severity: moderate Cough description: non-productive, hacking and irritating, does occasionally cough up clear mucus  Aggravating factors:  nothing Alleviating factors: Nyquil  and Ibuprofen  Status:  stable Treatments attempted: Prilosec Wheezing: no Shortness of breath: no Chest pain: no Chest tightness:no Nasal congestion: no Runny nose: sometimes Frequent throat clearing or swallowing: yes Hemoptysis: no Fevers: no Night sweats: no Weight loss: no Heartburn: no Smoking 1 to 1.5 packs daily.  ROS per HPI   Health Maintenance:  Health Maintenance Due  Topic Date Due  . Hepatitis C Screening  Never done  . COVID-19 Vaccine (1) Never done  . HIV Screening  Never done  . TETANUS/TDAP  Never done  . COLONOSCOPY (Pts 45-90yrs Insurance coverage will need to be confirmed)  Never done    Past  Medical History: There are no problems to display for this patient.  Social History   reports that he has been smoking cigarettes. He has quit using smokeless tobacco.  His smokeless tobacco use included chew. He reports that he does not drink alcohol and does not use drugs.   Family History  family history is not on file.   Medications: reviewed and updated   Objective:   Physical Exam BP 134/85 (BP Location: Left Arm, Patient Position: Sitting)   Pulse 83   Ht 5' 7.72" (1.72 m)   Wt 129 lb 3.2 oz (58.6 kg)   SpO2 97%   BMI 19.81 kg/m  Physical Exam HENT:     Head: Normocephalic.  Eyes:     Extraocular Movements: Extraocular movements intact.     Pupils: Pupils are equal, round, and reactive to light.  Cardiovascular:     Rate and Rhythm: Normal rate and regular rhythm.     Pulses: Normal pulses.     Heart sounds: Normal heart sounds.  Pulmonary:     Effort: Pulmonary effort is normal.     Breath sounds: Wheezing present.     Comments: Mild wheezing left upper and left lower lung fields. Musculoskeletal:     Cervical back: Normal range of motion and neck supple.  Neurological:     General: No focal deficit present.     Mental Status: He is alert.  Psychiatric:        Mood and Affect: Mood normal.        Behavior: Behavior normal.     Assessment & Plan:  1. Encounter to establish care: - Patient presents today to establish care.  -  Return in 4 to 6 weeks or sooner if needed for annual physical examination, labs, and health maintenance. Arrive fasting meaning having had no food and/or nothing to drink for at least 8 hours prior to appointment.  2. Chronic cough: - Patient with chronic cough. Does have 30 pack-year smoking history. Recent x-ray and visit at Urgent Care with concern for undiagnosed COPD. - Benzonatate as prescribed for cough. - Referral to Pulmonology for further evaluation and management. - Follow-up with primary provider as scheduled. - Ambulatory  referral to Pulmonology - benzonatate ([name]) 100 MG capsule; Take 1 capsule (100 mg total) by mouth 3 (three) times daily as needed for cough.  Dispense: 20 capsule; Refill: 0  3. Chronic hoarseness: - Chronic hoarseness.  - Referral to ENT for further evaluation and management. - Ambulatory referral to ENT  4. Wheezing: - Stable in office today. Denies shortness of breath and chest pain.  - Albuterol inhaler as prescribed pending COPD workup.  - Follow-up with primary provider as scheduled. - albuterol (VENTOLIN HFA) 108 (90 Base) MCG/ACT inhaler; Inhale 2 puffs into the lungs every 6 (six) hours as needed for wheezing or shortness of breath.  Dispense: 8 g; Refill: 0  5. Encounter for screening for lung cancer: - Currently smoking 1 to 1.5 pack cigarettes daily. At least 30 pack year history.  - Lung cancer screening via CT chest.  - CT CHEST LUNG CA SCREEN LOW DOSE W/O CM; Future  6. Declined smoking cessation: - Patient not ready to quit just yet. - Counseled to quit.  Discussed health risk associated with smoking and cessation methods including NRT, Chantix, Buproprion.   7. Influenza vaccine refused: - Declined.  8. Tetanus, diphtheria, and acellular pertussis (Tdap) vaccination declined: - Declined.  Patient was given clear instructions to go to Emergency Department or return to medical center if symptoms don't improve, worsen, or new problems develop.The patient verbalized understanding.  [name], NP 02/24/2020, 1:37 PM Primary Care at [facility]

## 2020-02-24 ENCOUNTER — Other Ambulatory Visit: Payer: Self-pay

## 2020-02-24 ENCOUNTER — Encounter: Payer: Self-pay | Admitting: Family

## 2020-02-24 ENCOUNTER — Ambulatory Visit (INDEPENDENT_AMBULATORY_CARE_PROVIDER_SITE_OTHER): Payer: 59 | Admitting: Family

## 2020-02-24 VITALS — BP 134/85 | HR 83 | Ht 67.72 in | Wt 129.2 lb

## 2020-02-24 DIAGNOSIS — R490 Dysphonia: Secondary | ICD-10-CM | POA: Diagnosis not present

## 2020-02-24 DIAGNOSIS — Z7689 Persons encountering health services in other specified circumstances: Secondary | ICD-10-CM | POA: Diagnosis not present

## 2020-02-24 DIAGNOSIS — R062 Wheezing: Secondary | ICD-10-CM

## 2020-02-24 DIAGNOSIS — R053 Chronic cough: Secondary | ICD-10-CM | POA: Diagnosis not present

## 2020-02-24 DIAGNOSIS — Z122 Encounter for screening for malignant neoplasm of respiratory organs: Secondary | ICD-10-CM

## 2020-02-24 DIAGNOSIS — Z2821 Immunization not carried out because of patient refusal: Secondary | ICD-10-CM

## 2020-02-24 DIAGNOSIS — Z720 Tobacco use: Secondary | ICD-10-CM

## 2020-02-24 MED ORDER — BENZONATATE 100 MG PO CAPS: 100.0000 mg | ORAL_CAPSULE | Freq: Three times a day (TID) | ORAL | 0 refills | Status: DC | PRN

## 2020-02-24 MED ORDER — ALBUTEROL SULFATE HFA 108 (90 BASE) MCG/ACT IN AERS
2.0000 | INHALATION_SPRAY | Freq: Four times a day (QID) | RESPIRATORY_TRACT | 0 refills | Status: DC | PRN
Start: 2020-02-24 — End: 2020-04-13

## 2020-02-24 NOTE — Patient Instructions (Addendum)
Return in 4 to 6 weeks or sooner if needed for annual physical examination, labs, and health maintenance. Arrive fasting meaning having had no food and/or nothing to drink for at least 8 hours prior to appointment.  [name] for cough.   Referral to Pulmonology for chronic cough.   Referral to ENT for chronic hoarseness.  Thank you for choosing Primary Care at [facility] for your medical home!    Erik [name] was seen by Erik Herter, NP today.   [name] primary care provider is Erik Little [name], NP.   For the best care possible,  you should try to see Erik Fruits, NP whenever you come to clinic.   We look forward to seeing you again soon!  If you have any questions about your visit today,  please call us at [phone]  Or feel free to reach your provider via [place].    Chronic Obstructive Pulmonary Disease  Chronic obstructive pulmonary disease (COPD) is a long-term (chronic) lung problem. When you have COPD, it is hard for air to get in and out of your lungs. Usually the condition gets worse over time, and your lungs will never return to normal. There are things you can do to keep yourself as healthy as possible. What are the causes?  Smoking. This is the most common cause.  Certain genes passed from parent to child (inherited). What increases the risk?  Being exposed to secondhand smoke from cigarettes, pipes, or cigars.  Being exposed to chemicals and other irritants, such as fumes and dust in the work environment.  Having chronic lung conditions or infections. What are the signs or symptoms?  Shortness of breath, especially during physical activity.  A long-term cough with a large amount of thick mucus. Sometimes, the cough may not have any mucus (dry cough).  Wheezing.  Breathing quickly.  Skin that looks gray or blue, especially in the fingers, toes, or lips.  Feeling tired (fatigue).  Weight loss.  Chest tightness.  Having  infections often.  Episodes when breathing symptoms become much worse (exacerbations). At the later stages of this disease, you may have swelling in the ankles, feet, or legs. How is this treated?  Taking medicines.  Quitting smoking, if you smoke.  Rehabilitation. This includes steps to make your body work better. It may involve a team of specialists.  Doing exercises.  Making changes to your diet.  Using oxygen.  Lung surgery.  Lung transplant.  Comfort measures (palliative care). Follow these instructions at home: Medicines  Take over-the-counter and prescription medicines only as told by your doctor.  Talk to your doctor before taking any cough or allergy medicines. You may need to avoid medicines that cause your lungs to be dry. Lifestyle  If you smoke, stop smoking. Smoking makes the problem worse.  Do not smoke or use any products that contain nicotine or tobacco. If you need help quitting, ask your doctor.  Avoid being around things that make your breathing worse. This may include smoke, chemicals, and fumes.  Stay active, but remember to rest as well.  Learn and use tips on how to manage stress and control your breathing.  Make sure you get enough sleep. Most adults need at least 7 hours of sleep every night.  Eat healthy foods. Eat smaller meals more often. Rest before meals. Controlled breathing Learn and use tips on how to control your breathing as told by your doctor. Try:  Breathing in (inhaling) through your nose for  1 second. Then, pucker your lips and breath out (exhale) through your lips for 2 seconds.  Putting one hand on your belly (abdomen). Breathe in slowly through your nose for 1 second. Your hand on your belly should move out. Pucker your lips and breathe out slowly through your lips. Your hand on your belly should move in as you breathe out.   Controlled coughing Learn and use controlled coughing to clear mucus from your lungs. Follow these  steps: 1. Lean your head a little forward. 2. Breathe in deeply. 3. Try to hold your breath for 3 seconds. 4. Keep your mouth slightly open while coughing 2 times. 5. Spit any mucus out into a tissue. 6. Rest and do the steps again 1 or 2 times as needed. General instructions  Make sure you get all the shots (vaccines) that your doctor recommends. Ask your doctor about a flu shot and a pneumonia shot.  Use oxygen therapy and pulmonary rehabilitation if told by your doctor. If you need home oxygen therapy, ask your doctor if you should buy a tool to measure your oxygen level (oximeter).  Make a COPD action plan with your doctor. This helps you to know what to do if you feel worse than usual.  Manage any other conditions you have as told by your doctor.  Avoid going outside when it is very hot, cold, or humid.  Avoid people who have a sickness you can catch (contagious).  Keep all follow-up visits. Contact a doctor if:  You cough up more mucus than usual.  There is a change in the color or thickness of the mucus.  It is harder to breathe than usual.  Your breathing is faster than usual.  You have trouble sleeping.  You need to use your medicines more often than usual.  You have trouble doing your normal activities such as getting dressed or walking around the house. Get help right away if:  You have shortness of breath while resting.  You have shortness of breath that stops you from: ? Being able to talk. ? Doing normal activities.  Your chest hurts for longer than 5 minutes.  Your skin color is more blue than usual.  Your pulse oximeter shows that you have low oxygen for longer than 5 minutes.  You have a fever.  You feel too tired to breathe normally. These symptoms may represent a serious problem that is an emergency. Do not wait to see if the symptoms will go away. Get medical help right away. Call your local emergency services (911 in the [place] Do not drive  yourself to the hospital. Summary  Chronic obstructive pulmonary disease (COPD) is a long-term lung problem.  The way your lungs work will never return to normal. Usually the condition gets worse over time. There are things you can do to keep yourself as healthy as possible.  Take over-the-counter and prescription medicines only as told by your doctor.  If you smoke, stop. Smoking makes the problem worse. This information is not intended to replace advice given to you by your health care provider. Make sure you discuss any questions you have with your health care provider. Document Revised: 11/25/2019 Document Reviewed: 11/25/2019 Elsevier Patient Education  2021 [employer].

## 2020-02-24 NOTE — Progress Notes (Signed)
Establish care Hoarseness/cough going on for awhile  U/C visit provider stated that it could be attributed to [place] has been helping needs refill

## 2020-02-25 ENCOUNTER — Telehealth: Payer: Self-pay | Admitting: General Practice

## 2020-02-25 NOTE — Telephone Encounter (Signed)
Called and spoke with pt.  Pt has NOT had a covid vaccine.  Pt states he is NOT interested in getting one.

## 2020-03-01 ENCOUNTER — Telehealth: Payer: Self-pay

## 2020-03-01 NOTE — Telephone Encounter (Signed)
CPT Code inquiry from scheduling, please advise.

## 2020-03-03 ENCOUNTER — Ambulatory Visit (HOSPITAL_COMMUNITY): Payer: 59

## 2020-03-03 ENCOUNTER — Telehealth: Payer: Self-pay

## 2020-03-03 NOTE — Telephone Encounter (Signed)
Pt. called stating they are having an allergic reaction one of the medications. Please advice.

## 2020-03-04 ENCOUNTER — Emergency Department (HOSPITAL_COMMUNITY): Payer: 59

## 2020-03-04 ENCOUNTER — Encounter (HOSPITAL_COMMUNITY): Payer: Self-pay

## 2020-03-04 ENCOUNTER — Inpatient Hospital Stay (HOSPITAL_COMMUNITY)
Admission: EM | Admit: 2020-03-04 | Discharge: 2020-03-10 | DRG: 012 | Disposition: A | Discharge status: Home Health | Payer: 59 | Attending: Otolaryngology | Admitting: Otolaryngology

## 2020-03-04 ENCOUNTER — Other Ambulatory Visit: Payer: Self-pay

## 2020-03-04 DIAGNOSIS — Z20822 Contact with and (suspected) exposure to covid-19: Secondary | ICD-10-CM | POA: Diagnosis present

## 2020-03-04 DIAGNOSIS — I2510 Atherosclerotic heart disease of native coronary artery without angina pectoris: Secondary | ICD-10-CM | POA: Diagnosis present

## 2020-03-04 DIAGNOSIS — C329 Malignant neoplasm of larynx, unspecified: Principal | ICD-10-CM | POA: Diagnosis present

## 2020-03-04 DIAGNOSIS — C321 Malignant neoplasm of supraglottis: Secondary | ICD-10-CM | POA: Diagnosis present

## 2020-03-04 DIAGNOSIS — K219 Gastro-esophageal reflux disease without esophagitis: Secondary | ICD-10-CM | POA: Diagnosis present

## 2020-03-04 DIAGNOSIS — E440 Moderate protein-calorie malnutrition: Secondary | ICD-10-CM | POA: Diagnosis present

## 2020-03-04 DIAGNOSIS — Z955 Presence of coronary angioplasty implant and graft: Secondary | ICD-10-CM

## 2020-03-04 DIAGNOSIS — R64 Cachexia: Secondary | ICD-10-CM | POA: Diagnosis present

## 2020-03-04 DIAGNOSIS — J988 Other specified respiratory disorders: Secondary | ICD-10-CM

## 2020-03-04 DIAGNOSIS — F17210 Nicotine dependence, cigarettes, uncomplicated: Secondary | ICD-10-CM | POA: Diagnosis present

## 2020-03-04 DIAGNOSIS — Z681 Body mass index (BMI) 19 or less, adult: Secondary | ICD-10-CM | POA: Diagnosis not present

## 2020-03-04 HISTORY — DX: Dyspnea, unspecified: R06.00

## 2020-03-04 LAB — CBC WITH DIFFERENTIAL/PLATELET
Abs Immature Granulocytes: 0.02 10*3/uL (ref 0.00–0.07)
Basophils Absolute: 0 10*3/uL (ref 0.0–0.1)
Basophils Relative: 0 %
Eosinophils Absolute: 0.1 10*3/uL (ref 0.0–0.5)
Eosinophils Relative: 2 %
HCT: 42.5 % (ref 39.0–52.0)
Hemoglobin: 14 g/dL (ref 13.0–17.0)
Immature Granulocytes: 0 %
Lymphocytes Relative: 22 %
Lymphs Abs: 1.8 10*3/uL (ref 0.7–4.0)
MCH: 30.6 pg (ref 26.0–34.0)
MCHC: 32.9 g/dL (ref 30.0–36.0)
MCV: 92.8 fL (ref 80.0–100.0)
Monocytes Absolute: 0.7 10*3/uL (ref 0.1–1.0)
Monocytes Relative: 8 %
Neutro Abs: 5.7 10*3/uL (ref 1.7–7.7)
Neutrophils Relative %: 68 %
Platelets: 353 10*3/uL (ref 150–400)
RBC: 4.58 MIL/uL (ref 4.22–5.81)
RDW: 13.4 % (ref 11.5–15.5)
WBC: 8.4 10*3/uL (ref 4.0–10.5)
nRBC: 0 % (ref 0.0–0.2)

## 2020-03-04 LAB — BASIC METABOLIC PANEL
Anion gap: 10 (ref 5–15)
BUN: 21 mg/dL — ABNORMAL HIGH (ref 6–20)
CO2: 29 mmol/L (ref 22–32)
Calcium: 9.6 mg/dL (ref 8.9–10.3)
Chloride: 102 mmol/L (ref 98–111)
Creatinine, Ser: 0.83 mg/dL (ref 0.61–1.24)
GFR, Estimated: >60 mL/min (ref >60)
Glucose, Bld: 104 mg/dL — ABNORMAL HIGH (ref 70–99)
Potassium: 3.6 mmol/L (ref 3.5–5.1)
Sodium: 141 mmol/L (ref 135–145)

## 2020-03-04 LAB — SARS CORONAVIRUS 2 BY RT PCR (HOSPITAL ORDER, PERFORMED IN ~~LOC~~ HOSPITAL LAB): SARS Coronavirus 2: NEGATIVE

## 2020-03-04 MED ORDER — ALBUTEROL SULFATE HFA 108 (90 BASE) MCG/ACT IN AERS
6.0000 | INHALATION_SPRAY | Freq: Once | RESPIRATORY_TRACT | Status: AC
  Administered 2020-03-04: 6 via RESPIRATORY_TRACT
  Filled 2020-03-04: qty 6.7

## 2020-03-04 MED ORDER — IPRATROPIUM BROMIDE HFA 17 MCG/ACT IN AERS
5.0000 | INHALATION_SPRAY | Freq: Once | RESPIRATORY_TRACT | Status: AC
  Administered 2020-03-04: 5 via RESPIRATORY_TRACT
  Filled 2020-03-04: qty 12.9

## 2020-03-04 MED ORDER — ONDANSETRON HCL 4 MG PO TABS: 4.0000 mg | ORAL_TABLET | ORAL | Status: DC | PRN

## 2020-03-04 MED ORDER — RACEPINEPHRINE HCL 2.25 % IN NEBU
0.5000 mL | INHALATION_SOLUTION | Freq: Once | RESPIRATORY_TRACT | Status: AC
  Administered 2020-03-04: 0.5 mL via RESPIRATORY_TRACT
  Filled 2020-03-04: qty 0.5

## 2020-03-04 MED ORDER — METHYLPREDNISOLONE SODIUM SUCC 125 MG IJ SOLR
125.0000 mg | Freq: Once | INTRAMUSCULAR | Status: AC
  Administered 2020-03-04: 125 mg via INTRAVENOUS
  Filled 2020-03-04: qty 2

## 2020-03-04 MED ORDER — ONDANSETRON HCL 4 MG/2ML IJ SOLN: 4.0000 mg | INTRAMUSCULAR | Status: DC | PRN

## 2020-03-04 MED ORDER — ACETAMINOPHEN 160 MG/5ML PO SOLN: 650.0000 mg | ORAL | Status: DC | PRN

## 2020-03-04 MED ORDER — KCL IN DEXTROSE-NACL 20-5-0.45 MEQ/L-%-% IV SOLN
INTRAVENOUS | Status: DC
  Filled 2020-03-04 (×6): qty 1000

## 2020-03-04 MED ORDER — ACETAMINOPHEN 650 MG RE SUPP: 650.0000 mg | RECTAL | Status: DC | PRN

## 2020-03-04 MED ORDER — NICOTINE 21 MG/24HR TD PT24
21.0000 mg | MEDICATED_PATCH | Freq: Once | TRANSDERMAL | Status: AC
  Administered 2020-03-04: 21 mg via TRANSDERMAL
  Filled 2020-03-04: qty 1

## 2020-03-04 MED ORDER — IOHEXOL 300 MG/ML  SOLN
75.0000 mL | Freq: Once | INTRAMUSCULAR | Status: AC | PRN
  Administered 2020-03-04: 75 mL via INTRAVENOUS

## 2020-03-04 NOTE — ED Provider Notes (Signed)
[facility] DEPT Provider Note   CSN: [id number] Arrival date & time: 03/04/20  1149     History Chief Complaint  Patient presents with  . Sore Throat    Erik [name] is a 55 y.o. male with past medical history significant for CAD, GERD, tobacco abuse with 1ppd/35 years.  Did not have COVID vaccinations.  HPI Presents to emergency room today with chief complaint of sore throat and shortness of breath x1 week.  He states his symptoms have been progressively worsening over the last 6 months. He has been treated for GERD without symptom improvement.  He states he had significant swelling in his throat day of symptom onset that has improved however still present.  He feels like he has swelling in his throat that is worsening.  He states he has history of hoarseness however has never been this severe and he has not had swelling in the past.  He is also endorsing a productive cough.  He has not tried any medications for symptoms prior to arrival.  States he is having difficulty with p.o. intake secondary to symptoms.  Denies any known Covid exposures or sick contacts.  Denies weight loss, fatigue, fever, chills, congestion, chest pain, dental pain.   Past Medical History:  Diagnosis Date  . Coronary artery disease   . GERD (gastroesophageal reflux disease)    Phreesia 02/21/2020  . Ruptured lumbar intervertebral disc     There are no problems to display for this patient.   Past Surgical History:  Procedure Laterality Date  . CARDIAC CATHETERIZATION     with stent placement  . TONSILLECTOMY         No family history on file.  Social History   Tobacco Use  . Smoking status: Current Every Day Smoker    Types: Cigarettes  . Smokeless tobacco: Former Systems developer    Types: Secondary school teacher  . Vaping Use: Former  Substance Use Topics  . Alcohol use: No  . Drug use: No    Home Medications Prior to Admission medications   Medication Sig Start Date End  Date Taking? Authorizing Provider  albuterol (VENTOLIN HFA) 108 (90 Base) MCG/ACT inhaler Inhale 2 puffs into the lungs every 6 (six) hours as needed for wheezing or shortness of breath. 02/24/20   [name], NP  benzonatate ([name]) 100 MG capsule Take 1 capsule (100 mg total) by mouth 3 (three) times daily as needed for cough. 02/24/20   [name], NP  ibuprofen (ADVIL,MOTRIN) 200 MG tablet Take 400 mg by mouth as needed. For pain    [provider]  omeprazole (PRILOSEC) 20 MG capsule Take 1 capsule (20 mg total) by mouth 2 (two) times daily before a meal. 11/25/19 12/25/19  Corena Herter, PA-C    Allergies    Patient has no known allergies.  Review of Systems   Review of Systems All other systems are reviewed and are negative for acute change except as noted in the HPI.  Physical Exam Updated Vital Signs BP (!) 146/99   Pulse 97   Temp 98 F (36.7 C) (Oral)   Resp (!) 22   Ht 5\' 8"  (1.727 m)   Wt 59 kg   SpO2 100%   BMI 19.77 kg/m   Physical Exam Vitals and nursing note reviewed.  Constitutional:      General: He is not in acute distress.    Appearance: He is not ill-appearing.  HENT:  Head: Normocephalic and atraumatic.     Right Ear: External ear normal.     Left Ear: External ear normal.     Nose: Nose normal.     Mouth/Throat:     Mouth: Mucous membranes are moist. No angioedema.     Tongue: No lesions.     Pharynx: Oropharynx is clear. Uvula midline. No pharyngeal swelling, oropharyngeal exudate, posterior oropharyngeal erythema or uvula swelling.     Tonsils: No tonsillar exudate.     Comments: Overall poor dentition  Eyes:     General: No scleral icterus.       Right eye: No discharge.        Left eye: No discharge.     Extraocular Movements: Extraocular movements intact.     Conjunctiva/sclera: Conjunctivae normal.     Pupils: Pupils are equal, round, and reactive to light.  Neck:     Vascular: No JVD.    Cardiovascular:     Rate and Rhythm: Normal rate and regular rhythm.     Pulses: Normal pulses.          Radial pulses are 2+ on the right side and 2+ on the left side.     Heart sounds: Normal heart sounds.  Pulmonary:     Comments: Lungs clear to auscultation in all fields. Symmetric chest rise. No wheezing, rales, or rhonchi.   Patient is whispering to communicate.  Speaking in short sentences only 3-4 words. Inspiratory and expiratory wheezing heard. Patient in mild respiratory distress. No accessory muscle use. Oxygen saturation is 100 % on room air Abdominal:     Comments: Abdomen is soft, non-distended, and non-tender in all quadrants. No rigidity, no guarding. No peritoneal signs.  Musculoskeletal:        General: Normal range of motion.     Cervical back: Normal range of motion.  Skin:    General: Skin is warm and dry.     Capillary Refill: Capillary refill takes less than 2 seconds.  Neurological:     Mental Status: He is oriented to person, place, and time.     GCS: GCS eye subscore is 4. GCS verbal subscore is 5. GCS motor subscore is 6.     Comments: Fluent speech, no facial droop.  Psychiatric:        Behavior: Behavior normal.     ED Results / Procedures / Treatments   Labs (all labs ordered are listed, but only abnormal results are displayed) Labs Reviewed  BASIC METABOLIC PANEL - Abnormal; Notable for the following components:      Result Value   Glucose, Bld 104 (*)    BUN 21 (*)    All other components within normal limits  SARS CORONAVIRUS 2 BY RT PCR (HOSPITAL ORDER, [facility])  CBC WITH DIFFERENTIAL/PLATELET    EKG None  Radiology DG Chest 2 View  Result Date: 03/04/2020 CLINICAL DATA:  COPD exacerbation EXAM: CHEST - 2 VIEW COMPARISON:  11/25/2019 FINDINGS: The heart size and mediastinal contours are within normal limits. Atherosclerotic calcification of the aortic knob. Mildly hyperexpanded lungs. No focal airspace  consolidation, pleural effusion, or pneumothorax. The visualized skeletal structures are unremarkable. IMPRESSION: No active cardiopulmonary disease. Electronically Signed   By: Davina Poke D.O.   On: 03/04/2020 15:58   CT Soft Tissue Neck W Contrast  Result Date: 03/04/2020 CLINICAL DATA:  Soft tissue mass, neck, no prior imaging; smoker, hoarseness different than past, external neck swelling, patient reports sensation of  internal edema. Additional history provided: Patient reports sore throat and shortness of breath since last Thursday, throat swelling. EXAM: CT NECK WITH CONTRAST TECHNIQUE: Multidetector CT imaging of the neck was performed using the standard protocol following the bolus administration of intravenous contrast. CONTRAST:  17mL OMNIPAQUE IOHEXOL 300 MG/ML  SOLN COMPARISON:  Neck CT 09/29/2005. FINDINGS: Mildly motion degraded examination. Pharynx and larynx: Streak artifact from dental restoration partially obscures the oral cavity. No appreciable swelling or discrete mass within the oral cavity or pharynx. There is mucosal irregularity as well as mucosal/submucosal edema of the supraglottic and glottic larynx. Resultant mild-to-moderate narrowing and mild rightward deviation of the airway at this level. There is additional edema within the ventral neck. Salivary glands: No inflammation, mass, or stone. Thyroid: Unremarkable. Lymph nodes: Enlarged left level 2/3 lymph node measuring 14 mm in short axis (series 6, image 62) Vascular: The major vascular structures of the neck are patent. Aortic atherosclerosis. Minimal atherosclerotic plaque within the left carotid bifurcation and proximal ICA. Limited intracranial: No acute intracranial abnormality identified. Visualized orbits: Excluded from the field of view. Mastoids and visualized paranasal sinuses: Trace ethmoid sinus mucosal thickening at the imaged levels. No significant mastoid effusion. Skeleton: No acute bony abnormality or  aggressive osseous lesion. Upper chest: No consolidation within the imaged lung apices. Centrilobular emphysema. Other: Nonspecific 1.4 cm part cystic/part solid cutaneous/subcutaneous lesion within the midline upper neck (series 2, image 42). These results were called by telephone at the time of interpretation on 03/04/2020 at 2:48 pm to provider PA Sherol Dade , who verbally acknowledged these results. IMPRESSION: Mucosal irregularity and mucosal/submucosal edema of the supraglottic and glottic larynx. Primary differential considerations include infectious/inflammatory laryngeal edema versus laryngeal malignancy. ENT consultation and direct visualization recommended. Associated mild-to-moderate narrowing, and mild rightward deviation, of the airway at this level. Additional edema within the cutaneous/subcutaneous ventral neck. Mildly enlarged left level 2/3 lymph node which may be reactive or could reflect metastatic nodal disease. Nonspecific 1.4 cm part cystic/part solid cutaneous/subcutaneous lesion within the midline upper neck. Direct visualization recommended. Emphysema (ICD10-J43.9). Aortic Atherosclerosis (ICD10-I70.0). Electronically Signed   By: Kellie Simmering DO   On: 03/04/2020 14:48    Procedures Procedures   Medications Ordered in ED Medications  nicotine (NICODERM CQ - dosed in mg/24 hours) patch 21 mg (21 mg Transdermal Patch Applied 03/04/20 1601)  methylPREDNISolone sodium succinate (SOLU-MEDROL) 125 mg/2 mL injection 125 mg (125 mg Intravenous Given 03/04/20 1222)  albuterol (VENTOLIN HFA) 108 (90 Base) MCG/ACT inhaler 6 puff (6 puffs Inhalation Given 03/04/20 1222)  ipratropium (ATROVENT HFA) inhaler 5 puff (5 puffs Inhalation Given 03/04/20 1234)  Racepinephrine HCl 2.25 % nebulizer solution 0.5 mL (0.5 mLs Nebulization Given 03/04/20 1436)  iohexol (OMNIPAQUE) 300 MG/ML solution 75 mL (75 mLs Intravenous Contrast Given 03/04/20 1405)    ED Course  I have reviewed the triage vital  signs and the nursing notes.  Pertinent labs & imaging results that were available during my care of the patient were reviewed by me and considered in my medical decision making (see chart for details).    MDM Rules/Calculators/A&P                          History provided by patient with additional history obtained from chart review.    55 yo male presenting with hoarseness and shortness of breath. He is afebrile, HDS. No hypoxia or tachycardia. On exam he is in mild respiratory distress. He has  swelling over thyroid without tenderness or nodule felt. No angioedema or concerning oropharynx findings besides overall poor dentition.   Patient given albuterol and ipratropium inhalers to act similar to nebulizer  and solumedrol. Reassessed patient after inhalers and steroid and now there is stridor heard on exam.   CBC and BMP overall unremarkable.The patient was discussed with and seen by ED supervising physician Dr. Laverta Baltimore who agrees with the plan for imaging and adding racemic epinephrine. covid test is negative. CT soft tissue neck viewed by myself and attending and findings are concerning for laryngeal malignancy. Findings shows mucosal irregularity and mucosal/submucosal edema of the supraglottic and glottic larynx. Mildly enlarged left level 2/3 lymph node which may be reactive or could reflect metastatic nodal disease. Additional edema within the cutaneous/subcutaneous ventral neck. Associated mild-to-moderate narrowing, and mild rightward deviation, of the airway at this level.Radiologist comments on nonspecific 1.4 cm part cystic/part solid cutaneous/subcutaneous lesion within the midline upper neck.  Reassessed patient again after intervention and still no symptom improvement. Patient given nicotine patch. Consulted on call ENT provider Dr. Redmond Baseman who evaluated patient at the bedside with plan to admit for trach placement and biopsy tomorrow with high suspicion of laryngeal malignancy. ENT will  admit. This was shared visit with ED attending Dr. Laverta Baltimore. He personally saw and evaluated patient.   Portions of this note were generated with Lobby[employer]. Dictation errors may occur despite best attempts at proofreading.   Final Clinical Impression(s) / ED Diagnoses Final diagnoses:  Airway obstruction    Rx / DC Orders ED Discharge Orders    None       Lewanda Rife 03/04/20 2005    Long, Joshua G, MD 03/05/20 [phone]

## 2020-03-04 NOTE — ED Notes (Signed)
ED TO INPATIENT HANDOFF REPORT  ED Nurse Name and Phone #: [facility] Highland [phone]  S Name/Age/Gender Erik [name] 55 y.o. male Room/Bed: WTR7/WTR7  Code Status   Code Status: Not on file  Home/SNF/Other Home Patient oriented to: self, place, time and situation Is this baseline? 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 Reports recent throat swelling that has gone down. 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 Coronary artery disease   . GERD (gastroesophageal reflux disease)    Phreesia 02/21/2020  . Ruptured lumbar intervertebral disc    Past Surgical History:  Procedure Laterality Date  . CARDIAC CATHETERIZATION     with stent placement  . TONSILLECTOMY       A IV Location/Drains/Wounds Patient Lines/Drains/Airways Status    Active Line/Drains/Airways    Name Placement date Placement time Site Days   Peripheral IV 03/04/20 Right Antecubital 03/04/20  1217  Antecubital  less than 1          Intake/Output Last 24 hours No intake or output data in the 24 hours ending 03/04/20 2139  Labs/Imaging Results for orders placed or  performed during the hospital encounter of 03/04/20 (from the past 48 hour(s))  Basic metabolic panel     Status: Abnormal   Collection Time: 03/04/20 12:19 PM  Result Value Ref Range   Sodium 141 135 - 145 mmol/L   Potassium 3.6 3.5 - 5.1 mmol/L   Chloride 102 98 - 111 mmol/L   CO2 29 22 - 32 mmol/L   Glucose, Bld 104 (H) 70 - 99 mg/dL    Comment: Glucose reference range applies only to samples taken after fasting for at least 8 hours.   BUN 21 (H) 6 - 20 mg/dL   Creatinine, Ser 0.83 0.61 - 1.24 mg/dL   Calcium 9.6 8.9 - 10.3 mg/dL   GFR, Estimated >60 >60 mL/min    Comment: (NOTE) Calculated using the CKD-EPI Creatinine Equation (2021)    Anion gap 10 5 - 15    Comment: Performed at [facility], [place] [address] [place], [place] [address]  CBC with Differential     Status: None   Collection Time: 03/04/20 12:19 PM  Result Value Ref Range   WBC 8.4 4.0 - 10.5 K/uL   RBC 4.58 4.22 - 5.81 MIL/uL   Hemoglobin 14.0 13.0 - 17.0 g/dL   HCT 42.5 39.0 - 52.0 %   MCV 92.8 80.0 - 100.0 fL   MCH 30.6 26.0 - 34.0 pg   MCHC 32.9 30.0 - 36.0 g/dL   RDW 13.4 11.5 - 15.5 %   Platelets 353 150 - 400  K/uL   nRBC 0.0 0.0 - 0.2 %   Neutrophils Relative % 68 %   Neutro Abs 5.7 1.7 - 7.7 K/uL   Lymphocytes Relative 22 %   Lymphs Abs 1.8 0.7 - 4.0 K/uL   Monocytes Relative 8 %   Monocytes Absolute 0.7 0.1 - 1.0 K/uL   Eosinophils Relative 2 %   Eosinophils Absolute 0.1 0.0 - 0.5 K/uL   Basophils Relative 0 %   Basophils Absolute 0.0 0.0 - 0.1 K/uL   Immature Granulocytes 0 %   Abs Immature Granulocytes 0.02 0.00 - 0.07 K/uL    Comment: Performed at [facility], [place] [address] [place], [place] [address]  SARS Coronavirus 2 by RT PCR (hospital order, performed in [facility] hospital lab) Nasopharyngeal Nasopharyngeal Swab     Status: None   Collection Time: 03/04/20  1:52 PM   Specimen: Nasopharyngeal Swab  Result Value Ref Range   SARS Coronavirus 2  NEGATIVE NEGATIVE    Comment: (NOTE) SARS-CoV-2 target nucleic acids are NOT DETECTED.  The SARS-CoV-2 RNA is generally detectable in upper and lower respiratory specimens during the acute phase of infection. The lowest concentration of SARS-CoV-2 viral copies this assay can detect is 250 copies / mL. A negative result does not preclude SARS-CoV-2 infection and should not be used as the sole basis for treatment or other patient management decisions.  A negative result may occur with improper specimen collection / handling, submission of specimen other than nasopharyngeal swab, presence of viral mutation(s) within the areas targeted by this assay, and inadequate number of viral copies (<250 copies / mL). A negative result must be combined with clinical observations, patient history, and epidemiological information.  Fact Sheet for Patients:   StrictlyIdeas.no  Fact Sheet for Healthcare Providers: BankingDealers.co.za  This test is not yet approved or  cleared by the [place] FDA and has been authorized for detection and/or diagnosis of SARS-CoV-2 by FDA under an Emergency Use Authorization (EUA).  This EUA will remain in effect (meaning this test can be used) for the duration of the COVID-19 declaration under Section 564(b)(1) of the Act, 21 U.S.C. section 360bbb-3(b)(1), unless the authorization is terminated or revoked sooner.  Performed at [facility], [place] [address], [place], [place] [address]    DG Chest 2 View  Result Date: 03/04/2020 CLINICAL DATA:  COPD exacerbation EXAM: CHEST - 2 VIEW COMPARISON:  11/25/2019 FINDINGS: The heart size and mediastinal contours are within normal limits. Atherosclerotic calcification of the aortic knob. Mildly hyperexpanded lungs. No focal airspace consolidation, pleural effusion, or pneumothorax. The visualized skeletal structures are unremarkable. IMPRESSION: No active  cardiopulmonary disease. Electronically Signed   By: Davina Poke D.O.   On: 03/04/2020 15:58   CT Soft Tissue Neck W Contrast  Result Date: 03/04/2020 CLINICAL DATA:  Soft tissue mass, neck, no prior imaging; smoker, hoarseness different than past, external neck swelling, patient reports sensation of internal edema. Additional history provided: Patient reports sore throat and shortness of breath since last Thursday, throat swelling. EXAM: CT NECK WITH CONTRAST TECHNIQUE: Multidetector CT imaging of the neck was performed using the standard protocol following the bolus administration of intravenous contrast. CONTRAST:  87mL OMNIPAQUE IOHEXOL 300 MG/ML  SOLN COMPARISON:  Neck CT 09/29/2005. FINDINGS: Mildly motion degraded examination. Pharynx and larynx: Streak artifact from dental restoration partially obscures the oral cavity. No appreciable swelling or discrete mass within the oral cavity or pharynx. There is mucosal irregularity as well as mucosal/submucosal  edema of the supraglottic and glottic larynx. Resultant mild-to-moderate narrowing and mild rightward deviation of the airway at this level. There is additional edema within the ventral neck. Salivary glands: No inflammation, mass, or stone. Thyroid: Unremarkable. Lymph nodes: Enlarged left level 2/3 lymph node measuring 14 mm in short axis (series 6, image 62) Vascular: The major vascular structures of the neck are patent. Aortic atherosclerosis. Minimal atherosclerotic plaque within the left carotid bifurcation and proximal ICA. Limited intracranial: No acute intracranial abnormality identified. Visualized orbits: Excluded from the field of view. Mastoids and visualized paranasal sinuses: Trace ethmoid sinus mucosal thickening at the imaged levels. No significant mastoid effusion. Skeleton: No acute bony abnormality or aggressive osseous lesion. Upper chest: No consolidation within the imaged lung apices. Centrilobular emphysema. Other: Nonspecific  1.4 cm part cystic/part solid cutaneous/subcutaneous lesion within the midline upper neck (series 2, image 42). These results were called by telephone at the time of interpretation on 03/04/2020 at 2:48 pm to provider PA Sherol Dade , who verbally acknowledged these results. IMPRESSION: Mucosal irregularity and mucosal/submucosal edema of the supraglottic and glottic larynx. Primary differential considerations include infectious/inflammatory laryngeal edema versus laryngeal malignancy. ENT consultation and direct visualization recommended. Associated mild-to-moderate narrowing, and mild rightward deviation, of the airway at this level. Additional edema within the cutaneous/subcutaneous ventral neck. Mildly enlarged left level 2/3 lymph node which may be reactive or could reflect metastatic nodal disease. Nonspecific 1.4 cm part cystic/part solid cutaneous/subcutaneous lesion within the midline upper neck. Direct visualization recommended. Emphysema (ICD10-J43.9). Aortic Atherosclerosis (ICD10-I70.0). Electronically Signed   By: Kellie Simmering DO   On: 03/04/2020 14:48    Pending Labs Unresulted Labs (From admission, onward)         None      Vitals/Pain Today's Vitals   03/04/20 1437 03/04/20 1500 03/04/20 1630 03/04/20 1800  BP:  (!) 142/90 136/88 126/85  Pulse: 69 80 95 80  Resp: 15 18 17 17   Temp:      TempSrc:      SpO2: 100% 100% 100% 99%  Weight:      Height:      PainSc:        Isolation Precautions No active isolations  Medications Medications  nicotine (NICODERM CQ - dosed in mg/24 hours) patch 21 mg (21 mg Transdermal Patch Applied 03/04/20 1601)  methylPREDNISolone sodium succinate (SOLU-MEDROL) 125 mg/2 mL injection 125 mg (125 mg Intravenous Given 03/04/20 1222)  albuterol (VENTOLIN HFA) 108 (90 Base) MCG/ACT inhaler 6 puff (6 puffs Inhalation Given 03/04/20 1222)  ipratropium (ATROVENT HFA) inhaler 5 puff (5 puffs Inhalation Given 03/04/20 1234)  Racepinephrine HCl 2.25 %  nebulizer solution 0.5 mL (0.5 mLs Nebulization Given 03/04/20 1436)  iohexol (OMNIPAQUE) 300 MG/ML solution 75 mL (75 mLs Intravenous Contrast Given 03/04/20 1405)    Mobility walks Low fall risk   Focused Assessments Pulmonary Assessment Handoff:  Lung sounds: Bilateral Breath Sounds: Expiratory wheezes (UPPER airway/Throat) O2 Device: Room Air        R Recommendations: See Admitting Provider Note  Report given to: Mortimer Fries RN  Additional Notes:

## 2020-03-04 NOTE — H&P (Signed)
Erik [name] is an 55 y.o. male.   Chief Complaint:  Difficulty breathing HPI: 55 year old male with six months of worsening hoarseness and throat pain previously treated presumptively for reflux but not really improving.  For the past week, he has had worsening difficulty breathing and came to the ER for this reason where he has been treated with steroids and racemic epinephrine without improvement.  A neck CT scan demonstrates abnormal soft tissue in the larynx with partial airway obstruction.  Consultation was requested.  He denies significant throat pain or difficulty swallowing.  Past Medical History:  Diagnosis Date  . Coronary artery disease   . GERD (gastroesophageal reflux disease)    Phreesia 02/21/2020  . Ruptured lumbar intervertebral disc     Past Surgical History:  Procedure Laterality Date  . CARDIAC CATHETERIZATION     with stent placement  . TONSILLECTOMY      No family history on file. Social History:  reports that he has been smoking cigarettes. He has quit using smokeless tobacco.  His smokeless tobacco use included chew. He reports that he does not drink alcohol and does not use drugs.  Allergies: No Known Allergies  (Not in a hospital admission)   Results for orders placed or performed during the hospital encounter of 03/04/20 (from the past 48 hour(s))  Basic metabolic panel     Status: Abnormal   Collection Time: 03/04/20 12:19 PM  Result Value Ref Range   Sodium 141 135 - 145 mmol/L   Potassium 3.6 3.5 - 5.1 mmol/L   Chloride 102 98 - 111 mmol/L   CO2 29 22 - 32 mmol/L   Glucose, Bld 104 (H) 70 - 99 mg/dL    Comment: Glucose reference range applies only to samples taken after fasting for at least 8 hours.   BUN 21 (H) 6 - 20 mg/dL   Creatinine, Ser 0.83 0.61 - 1.24 mg/dL   Calcium 9.6 8.9 - 10.3 mg/dL   GFR, Estimated >60 >60 mL/min    Comment: (NOTE) Calculated using the CKD-EPI Creatinine Equation (2021)    Anion gap 10 5 - 15    Comment:  Performed at [facility], [place] [address] [place], [place] [address]  CBC with Differential     Status: None   Collection Time: 03/04/20 12:19 PM  Result Value Ref Range   WBC 8.4 4.0 - 10.5 K/uL   RBC 4.58 4.22 - 5.81 MIL/uL   Hemoglobin 14.0 13.0 - 17.0 g/dL   HCT 42.5 39.0 - 52.0 %   MCV 92.8 80.0 - 100.0 fL   MCH 30.6 26.0 - 34.0 pg   MCHC 32.9 30.0 - 36.0 g/dL   RDW 13.4 11.5 - 15.5 %   Platelets 353 150 - 400 K/uL   nRBC 0.0 0.0 - 0.2 %   Neutrophils Relative % 68 %   Neutro Abs 5.7 1.7 - 7.7 K/uL   Lymphocytes Relative 22 %   Lymphs Abs 1.8 0.7 - 4.0 K/uL   Monocytes Relative 8 %   Monocytes Absolute 0.7 0.1 - 1.0 K/uL   Eosinophils Relative 2 %   Eosinophils Absolute 0.1 0.0 - 0.5 K/uL   Basophils Relative 0 %   Basophils Absolute 0.0 0.0 - 0.1 K/uL   Immature Granulocytes 0 %   Abs Immature Granulocytes 0.02 0.00 - 0.07 K/uL    Comment: Performed at [facility], [place] [address], [place], [place] [address]  SARS Coronavirus 2 by RT  PCR (hospital order, performed in [facility] hospital lab) Nasopharyngeal Nasopharyngeal Swab     Status: None   Collection Time: 03/04/20  1:52 PM   Specimen: Nasopharyngeal Swab  Result Value Ref Range   SARS Coronavirus 2 NEGATIVE NEGATIVE    Comment: (NOTE) SARS-CoV-2 target nucleic acids are NOT DETECTED.  The SARS-CoV-2 RNA is generally detectable in upper and lower respiratory specimens during the acute phase of infection. The lowest concentration of SARS-CoV-2 viral copies this assay can detect is 250 copies / mL. A negative result does not preclude SARS-CoV-2 infection and should not be used as the sole basis for treatment or other patient management decisions.  A negative result may occur with improper specimen collection / handling, submission of specimen other than nasopharyngeal swab, presence of viral mutation(s) within the areas targeted by this assay, and inadequate number of viral  copies (<250 copies / mL). A negative result must be combined with clinical observations, patient history, and epidemiological information.  Fact Sheet for Patients:   StrictlyIdeas.no  Fact Sheet for Healthcare Providers: BankingDealers.co.za  This test is not yet approved or  cleared by the [place] FDA and has been authorized for detection and/or diagnosis of SARS-CoV-2 by FDA under an Emergency Use Authorization (EUA).  This EUA will remain in effect (meaning this test can be used) for the duration of the COVID-19 declaration under Section 564(b)(1) of the Act, 21 U.S.C. section 360bbb-3(b)(1), unless the authorization is terminated or revoked sooner.  Performed at [facility], [place] [address] [place], [place] [address]    DG Chest 2 View  Result Date: 03/04/2020 CLINICAL DATA:  COPD exacerbation EXAM: CHEST - 2 VIEW COMPARISON:  11/25/2019 FINDINGS: The heart size and mediastinal contours are within normal limits. Atherosclerotic calcification of the aortic knob. Mildly hyperexpanded lungs. No focal airspace consolidation, pleural effusion, or pneumothorax. The visualized skeletal structures are unremarkable. IMPRESSION: No active cardiopulmonary disease. Electronically Signed   By: Davina Poke D.O.   On: 03/04/2020 15:58   CT Soft Tissue Neck W Contrast  Result Date: 03/04/2020 CLINICAL DATA:  Soft tissue mass, neck, no prior imaging; smoker, hoarseness different than past, external neck swelling, patient reports sensation of internal edema. Additional history provided: Patient reports sore throat and shortness of breath since last Thursday, throat swelling. EXAM: CT NECK WITH CONTRAST TECHNIQUE: Multidetector CT imaging of the neck was performed using the standard protocol following the bolus administration of intravenous contrast. CONTRAST:  68mL OMNIPAQUE IOHEXOL 300 MG/ML  SOLN COMPARISON:  Neck CT  09/29/2005. FINDINGS: Mildly motion degraded examination. Pharynx and larynx: Streak artifact from dental restoration partially obscures the oral cavity. No appreciable swelling or discrete mass within the oral cavity or pharynx. There is mucosal irregularity as well as mucosal/submucosal edema of the supraglottic and glottic larynx. Resultant mild-to-moderate narrowing and mild rightward deviation of the airway at this level. There is additional edema within the ventral neck. Salivary glands: No inflammation, mass, or stone. Thyroid: Unremarkable. Lymph nodes: Enlarged left level 2/3 lymph node measuring 14 mm in short axis (series 6, image 62) Vascular: The major vascular structures of the neck are patent. Aortic atherosclerosis. Minimal atherosclerotic plaque within the left carotid bifurcation and proximal ICA. Limited intracranial: No acute intracranial abnormality identified. Visualized orbits: Excluded from the field of view. Mastoids and visualized paranasal sinuses: Trace ethmoid sinus mucosal thickening at the imaged levels. No significant mastoid effusion. Skeleton: No acute bony abnormality or aggressive osseous lesion. Upper chest: No consolidation  within the imaged lung apices. Centrilobular emphysema. Other: Nonspecific 1.4 cm part cystic/part solid cutaneous/subcutaneous lesion within the midline upper neck (series 2, image 42). These results were called by telephone at the time of interpretation on 03/04/2020 at 2:48 pm to provider PA Sherol Dade , who verbally acknowledged these results. IMPRESSION: Mucosal irregularity and mucosal/submucosal edema of the supraglottic and glottic larynx. Primary differential considerations include infectious/inflammatory laryngeal edema versus laryngeal malignancy. ENT consultation and direct visualization recommended. Associated mild-to-moderate narrowing, and mild rightward deviation, of the airway at this level. Additional edema within the  cutaneous/subcutaneous ventral neck. Mildly enlarged left level 2/3 lymph node which may be reactive or could reflect metastatic nodal disease. Nonspecific 1.4 cm part cystic/part solid cutaneous/subcutaneous lesion within the midline upper neck. Direct visualization recommended. Emphysema (ICD10-J43.9). Aortic Atherosclerosis (ICD10-I70.0). Electronically Signed   By: Kellie Simmering DO   On: 03/04/2020 14:48    Review of Systems  HENT: Positive for sore throat and voice change.   Respiratory: Positive for shortness of breath.   All other systems reviewed and are negative.   Blood pressure 136/88, pulse 95, temperature 98 F (36.7 C), temperature source Oral, resp. rate 17, height 5\' 8"  (1.727 m), weight 59 kg, SpO2 100 %. Physical Exam Constitutional:      Appearance: He is well-developed.     Comments: Cachectic.  HENT:     Head: Normocephalic and atraumatic.     Right Ear: Tympanic membrane, ear canal and external ear normal.     Left Ear: Tympanic membrane, ear canal and external ear normal.     Nose: No congestion.     Mouth/Throat:     Mouth: Mucous membranes are moist.     Pharynx: Oropharynx is clear.     Comments: Tonsils absent.  Nearly aphonic. Eyes:     Extraocular Movements: Extraocular movements intact.     Conjunctiva/sclera: Conjunctivae normal.     Pupils: Pupils are equal, round, and reactive to light.  Cardiovascular:     Rate and Rhythm: Normal rate.  Pulmonary:     Effort: Pulmonary effort is normal.     Breath sounds: Stridor (Inspiratory with any deep breathing.) present.  Musculoskeletal:     Cervical back: Normal range of motion.  Skin:    General: Skin is warm and dry.  Neurological:     General: No focal deficit present.     Mental Status: He is alert and oriented to person, place, and time.  Psychiatric:        Mood and Affect: Mood normal.        Behavior: Behavior normal.        Thought Content: Thought content normal.        Judgment: Judgment  normal.      Assessment/Plan Laryngeal cancer with partial airway obstruction  Fiberoptic laryngoscopic exam was performed at the bedside demonstrating irregular tissue of the left glottis with immobile left hemilarynx and polypoid changes of the right vocal fold.  The glottic airway is narrowed markedly.  See procedure note.  I have recommended admission for observation due to airway compromise and plan awake tracheostomy with direct laryngoscopy with biopsy tomorrow.  Risks, benefits, and alternatives were discussed and he expressed understanding and agreement.  Melida Quitter, MD 03/04/2020, 5:42 PM

## 2020-03-04 NOTE — Progress Notes (Signed)
RT asked to assess patient. Patient in no respiratory distress laying on CT table at arrival. Patient ambulated to triage on his own after scan w/o O2. BBS mostly clear, with upper airway/throat wheeze. No history of VCD noted. Patient hoarse (has been ongoing) and communicates to myself similar to a whisper in volume. His neck appears to be swollen at this time as well. He states that he has not noticed if it is worse when he lays down at home prior to this, but felt it was more difficult while laying flat during CT scan. Patient is a current smoker per chart and mentioned he had an appointment scheduled with ENT and testing this month. COVID test pending. Racemic epi administered by RN. RT will continue to monitor patient.

## 2020-03-04 NOTE — ED Triage Notes (Signed)
Pt coming in c/o sore throat and shortness of breath since last thursday. Reports recent throat swelling that has gone down. Voice is hoarse in triage but talking in complete sentences

## 2020-03-05 ENCOUNTER — Inpatient Hospital Stay (HOSPITAL_COMMUNITY): Payer: 59 | Admitting: Certified Registered Nurse Anesthetist

## 2020-03-05 ENCOUNTER — Encounter (HOSPITAL_COMMUNITY): Payer: Self-pay | Admitting: Otolaryngology

## 2020-03-05 ENCOUNTER — Encounter (HOSPITAL_COMMUNITY)
Admission: EM | Disposition: A | Discharge status: Home Health | Payer: Self-pay | Source: Home / Self Care | Attending: Otolaryngology

## 2020-03-05 ENCOUNTER — Inpatient Hospital Stay: Admit: 2020-03-05 | Payer: 59 | Admitting: Otolaryngology

## 2020-03-05 DIAGNOSIS — E440 Moderate protein-calorie malnutrition: Secondary | ICD-10-CM | POA: Insufficient documentation

## 2020-03-05 HISTORY — PX: TRACHEOSTOMY TUBE PLACEMENT: SHX814

## 2020-03-05 LAB — GLUCOSE, CAPILLARY: Glucose-Capillary: 102 mg/dL — ABNORMAL HIGH (ref 70–99)

## 2020-03-05 LAB — SURGICAL PCR SCREEN
MRSA, PCR: NEGATIVE
Staphylococcus aureus: NEGATIVE

## 2020-03-05 SURGERY — CREATION, TRACHEOSTOMY
Anesthesia: General

## 2020-03-05 MED ORDER — FENTANYL CITRATE (PF) 100 MCG/2ML IJ SOLN
INTRAMUSCULAR | Status: DC | PRN
  Administered 2020-03-05: 25 ug via INTRAVENOUS

## 2020-03-05 MED ORDER — MIDAZOLAM HCL 5 MG/5ML IJ SOLN
INTRAMUSCULAR | Status: DC | PRN
  Administered 2020-03-05 (×2): 1 mg via INTRAVENOUS

## 2020-03-05 MED ORDER — ONDANSETRON HCL 4 MG/2ML IJ SOLN
INTRAMUSCULAR | Status: DC | PRN
  Administered 2020-03-05: 4 mg via INTRAVENOUS

## 2020-03-05 MED ORDER — CHLORHEXIDINE GLUCONATE CLOTH 2 % EX PADS
6.0000 | MEDICATED_PAD | Freq: Every day | CUTANEOUS | Status: DC
  Administered 2020-03-06 – 2020-03-10 (×5): 6 via TOPICAL

## 2020-03-05 MED ORDER — CHLORHEXIDINE GLUCONATE 0.12 % MT SOLN
15.0000 mL | Freq: Once | OROMUCOSAL | Status: AC
  Administered 2020-03-05: 15 mL via OROMUCOSAL

## 2020-03-05 MED ORDER — HYDROMORPHONE HCL 1 MG/ML IJ SOLN: 0.2500 mg | INTRAMUSCULAR | Status: DC | PRN

## 2020-03-05 MED ORDER — MORPHINE SULFATE (PF) 4 MG/ML IV SOLN
4.0000 mg | INTRAVENOUS | Status: DC | PRN
Start: 2020-03-05 — End: 2020-03-10
  Administered 2020-03-06 – 2020-03-10 (×20): 4 mg via INTRAVENOUS
  Filled 2020-03-05 (×22): qty 1

## 2020-03-05 MED ORDER — MIDAZOLAM HCL 2 MG/2ML IJ SOLN
INTRAMUSCULAR | Status: AC
  Filled 2020-03-05: qty 2

## 2020-03-05 MED ORDER — LIDOCAINE-EPINEPHRINE (PF) 1 %-1:200000 IJ SOLN
INTRAMUSCULAR | Status: AC
  Filled 2020-03-05: qty 30

## 2020-03-05 MED ORDER — 0.9 % SODIUM CHLORIDE (POUR BTL) OPTIME
TOPICAL | Status: DC | PRN
  Administered 2020-03-05: 1000 mL

## 2020-03-05 MED ORDER — PROMETHAZINE HCL 25 MG/ML IJ SOLN: 6.2500 mg | INTRAMUSCULAR | Status: DC | PRN

## 2020-03-05 MED ORDER — GLYCOPYRROLATE 0.2 MG/ML IJ SOLN
INTRAMUSCULAR | Status: DC | PRN
  Administered 2020-03-05: .2 mg via INTRAVENOUS

## 2020-03-05 MED ORDER — LACTATED RINGERS IV SOLN: INTRAVENOUS | Status: DC

## 2020-03-05 MED ORDER — DEXMEDETOMIDINE (PRECEDEX) IN NS 20 MCG/5ML (4 MCG/ML) IV SYRINGE
PREFILLED_SYRINGE | INTRAVENOUS | Status: DC | PRN
  Administered 2020-03-05 (×4): 8 ug via INTRAVENOUS
  Administered 2020-03-05 (×2): 10 ug via INTRAVENOUS
  Administered 2020-03-05: 8 ug via INTRAVENOUS

## 2020-03-05 MED ORDER — SUGAMMADEX SODIUM 200 MG/2ML IV SOLN
INTRAVENOUS | Status: DC | PRN
  Administered 2020-03-05: 150 mg via INTRAVENOUS

## 2020-03-05 MED ORDER — ACETAMINOPHEN 10 MG/ML IV SOLN
1000.0000 mg | Freq: Once | INTRAVENOUS | Status: DC | PRN
  Administered 2020-03-05: 1000 mg via INTRAVENOUS

## 2020-03-05 MED ORDER — ACETAMINOPHEN 10 MG/ML IV SOLN
INTRAVENOUS | Status: AC
  Filled 2020-03-05: qty 100

## 2020-03-05 MED ORDER — KCL IN DEXTROSE-NACL 20-5-0.45 MEQ/L-%-% IV SOLN
INTRAVENOUS | Status: AC
  Filled 2020-03-05: qty 1000

## 2020-03-05 MED ORDER — MEPERIDINE HCL 50 MG/ML IJ SOLN: 6.2500 mg | INTRAMUSCULAR | Status: DC | PRN

## 2020-03-05 MED ORDER — ROCURONIUM BROMIDE 10 MG/ML (PF) SYRINGE
PREFILLED_SYRINGE | INTRAVENOUS | Status: DC | PRN
  Administered 2020-03-05: 20 mg via INTRAVENOUS

## 2020-03-05 MED ORDER — PROPOFOL 10 MG/ML IV BOLUS
INTRAVENOUS | Status: DC | PRN
  Administered 2020-03-05: 80 mg via INTRAVENOUS
  Administered 2020-03-05: 20 mg via INTRAVENOUS

## 2020-03-05 MED ORDER — FENTANYL CITRATE (PF) 100 MCG/2ML IJ SOLN
INTRAMUSCULAR | Status: AC
  Filled 2020-03-05: qty 2

## 2020-03-05 MED ORDER — PROPOFOL 10 MG/ML IV BOLUS
INTRAVENOUS | Status: AC
  Filled 2020-03-05: qty 20

## 2020-03-05 MED ORDER — LIDOCAINE-EPINEPHRINE (PF) 1 %-1:200000 IJ SOLN
INTRAMUSCULAR | Status: DC | PRN
  Administered 2020-03-05: 6 mL

## 2020-03-05 SURGICAL SUPPLY — 35 items
ATTRACTOMAT 16X20 MAGNETIC DRP (DRAPES) IMPLANT
BLADE SURG 15 STRL LF DISP TIS (BLADE) ×1 IMPLANT
BLADE SURG 15 STRL SS (BLADE) ×2
BLADE SURG SZ11 CARB STEEL (BLADE) ×2 IMPLANT
BLOCK BITE DENTAL GUARD (BLADE) ×2 IMPLANT
CLEANER TIP ELECTROSURG 2X2 (MISCELLANEOUS) IMPLANT
COVER SURGICAL LIGHT HANDLE (MISCELLANEOUS) ×2 IMPLANT
COVER WAND RF STERILE (DRAPES) IMPLANT
DISSECTOR ROUND CHERRY 3/8 STR (MISCELLANEOUS) IMPLANT
DRSG TELFA 3X8 NADH (GAUZE/BANDAGES/DRESSINGS) ×2 IMPLANT
ELECT COATED BLADE 2.86 ST (ELECTRODE) ×2 IMPLANT
ELECT REM PT RETURN 15FT ADLT (MISCELLANEOUS) ×2 IMPLANT
GAUZE 4X4 16PLY RFD (DISPOSABLE) ×2 IMPLANT
GLOVE BIO SURGEON STRL SZ7.5 (GLOVE) ×2 IMPLANT
GOWN STRL REUS W/TWL LRG LVL3 (GOWN DISPOSABLE) ×2 IMPLANT
KIT BASIN OR (CUSTOM PROCEDURE TRAY) ×2 IMPLANT
KIT TURNOVER KIT A (KITS) IMPLANT
NEEDLE HYPO 22GX1.5 SAFETY (NEEDLE) ×2 IMPLANT
PACK EENT SPLIT (PACKS) ×2 IMPLANT
PENCIL SMOKE EVACUATOR (MISCELLANEOUS) IMPLANT
SPONGE DRAIN TRACH 4X4 STRL 2S (GAUZE/BANDAGES/DRESSINGS) ×2 IMPLANT
SUT SILK 2 0 (SUTURE)
SUT SILK 2 0 30  PSL (SUTURE)
SUT SILK 2 0 30 PSL (SUTURE) IMPLANT
SUT SILK 2 0 REEL (SUTURE) IMPLANT
SUT SILK 2 0 SH (SUTURE) IMPLANT
SUT SILK 2 0SH CR/8 30 (SUTURE) ×2 IMPLANT
SUT SILK 2-0 18XBRD TIE 12 (SUTURE) IMPLANT
SYR 10ML LL (SYRINGE) ×2 IMPLANT
SYR 20ML LL LF (SYRINGE) IMPLANT
TOWEL OR 17X26 10 PK STRL BLUE (TOWEL DISPOSABLE) ×2 IMPLANT
TUBE TRACH  6.0 CUFF FLEX (MISCELLANEOUS) ×4
TUBE TRACH 6.0 CUFF FLEX (MISCELLANEOUS) ×2 IMPLANT
TUBE TRACH FLEX 6.0 UNCF (MISCELLANEOUS) IMPLANT
YANKAUER SUCT BULB TIP 10FT TU (MISCELLANEOUS) ×2 IMPLANT

## 2020-03-05 NOTE — Progress Notes (Signed)
Subjective: No change to symptoms.  Objective: Vital signs in last 24 hours: Temp:  [98 F (36.7 C)-98.9 F (37.2 C)] 98.9 F (37.2 C) (02/04 1640) Pulse Rate:  [65-94] 94 (02/04 1640) Resp:  [15-20] 20 (02/04 1640) BP: (126-152)/(85-98) 151/96 (02/04 1640) SpO2:  [98 %-100 %] 100 % (02/04 1640) Wt Readings from Last 1 Encounters:  03/04/20 59 kg    Intake/Output from previous day: 02/03 0701 - 02/04 0700 In: 847.5 [P.O.:360; I.V.:487.5] Out: -  Intake/Output this shift: Total I/O In: 316.3 [I.V.:316.3] Out: -   General appearance: alert, cooperative and no distress Throat: soft inspiratory stridor with deep breathing, aphonic  Recent Labs    03/04/20 1219  WBC 8.4  HGB 14.0  HCT 42.5  PLT 353    Recent Labs    03/04/20 1219  NA 141  K 3.6  CL 102  CO2 29  GLUCOSE 104*  BUN 21*  CREATININE 0.83  CALCIUM 9.6    Medications: I have reviewed the patient's current medications.  Assessment/Plan: Laryngeal mass, airway obstruction  To OR for awake tracheostomy and direct laryngoscopy with biopsy.   LOS: 1 day   Melida Quitter 03/05/2020, 5:13 PM

## 2020-03-05 NOTE — Transfer of Care (Signed)
Immediate Anesthesia Transfer of Care Note  Patient: Erik [name]  Procedure(s) Performed: AWAKE TRACHEOSTOMY, DIRECT LARYNGOSCOPY WITH BIOPSY (N/A )  Patient Location: PACU  Anesthesia Type:General  Level of Consciousness: drowsy  Airway & Oxygen Therapy: Patient Spontanous Breathing and Patient connected to face mask oxygen  Post-op Assessment: Report given to RN and Post -op Vital signs reviewed and stable  Post vital signs: Reviewed and stable  Last Vitals:  Vitals Value Taken Time  BP 123/88 03/05/20 1847  Temp    Pulse 97 03/05/20 1849  Resp 25 03/05/20 1849  SpO2 97 % 03/05/20 1849  Vitals shown include unvalidated device data.  Last Pain:  Vitals:   03/05/20 1648  TempSrc:   PainSc: 0-No pain      Patients Stated Pain Goal: 2 (46/96/29 5284)  Complications: No complications documented.

## 2020-03-05 NOTE — Progress Notes (Signed)
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  GOAL:   Patient will meet greater than or equal to 90% of their needs  MONITOR:   Labs,Weight trends,I & O's,Diet advancement  REASON FOR ASSESSMENT:   Malnutrition Screening Tool    ASSESSMENT:   55 year old male with six months of worsening hoarseness and throat pain previously treated presumptively for reflux but not really improving.  For the past week, he has had worsening difficulty breathing and came to the ER for this reason where he has been treated with steroids and racemic epinephrine without improvement.  A neck CT scan demonstrates abnormal soft tissue in the larynx with partial airway obstruction.  Patient with new laryngeal cancer diagnosis per ENT note 2/3. Pt able to give history despite hoarseness. Pt reports he is able to eat but has not been eating as much d/t it becoming more difficult with his breathing. States he has a really hard time swallowing really hot or cold liquids. He considered trying protein shakes but never got around to buying any. Is agreeable to receiving them once diet is able to be advanced.   Currently NPO awaiting tracheostomy with direct laryngoscopy with biopsy today.   Pt states UBW is most likely between 145-150 lbs. States he has lost weight recently d/t a job change where he went back into [employer] which is very physically demanding.   Labs reviewed. Medications: D5 infusion   NUTRITION - FOCUSED PHYSICAL  EXAM:  Flowsheet Row Most Recent Value  Orbital Region No depletion  Upper Arm Region Moderate depletion  Thoracic and Lumbar Region Unable to assess  Buccal Region Moderate depletion  Temple Region Moderate depletion  Clavicle Bone Region Moderate depletion  Clavicle and Acromion Bone Region Moderate depletion  Scapular Bone Region Unable to assess  Dorsal Hand Mild depletion  Patellar Region Unable to assess  Anterior Thigh Region Unable to assess  Posterior Calf Region Unable to assess  Edema (RD Assessment) None  Mouth Reviewed       Diet Order:   Diet Order            Diet NPO time specified  Diet effective midnight                 EDUCATION NEEDS:   No education needs have been identified at this time  Skin:  Skin Assessment: Reviewed RN Assessment  Last BM:  2/3  Height:   Ht Readings from Last 1 Encounters:  03/04/20 5\' 8"  (1.727 m)    Weight:   Wt Readings from Last 1 Encounters:  03/04/20 59 kg   BMI:  Body mass index is 19.77 kg/m.  Estimated Nutritional Needs:   Kcal:  8119-1478  Protein:  85-100g  Fluid:  1.9L/day   Clayton Bibles, MS, RD, LDN Inpatient Clinical Dietitian Contact information available via Amion

## 2020-03-05 NOTE — Telephone Encounter (Signed)
Pt admitted to hospital  

## 2020-03-05 NOTE — Brief Op Note (Signed)
03/05/2020  6:33 PM  PATIENT:  Erik [name]  55 y.o. male  PRE-OPERATIVE DIAGNOSIS:  laryngeal mass, airway obstruction  POST-OPERATIVE DIAGNOSIS:  laryngeal mass, airway obstruction  PROCEDURE:  Procedure(s): AWAKE TRACHEOSTOMY, DIRECT LARYNGOSCOPY WITH BIOPSY (N/A)  SURGEON:  Surgeon(s) and Role:    Melida Quitter, MD - Primary  PHYSICIAN ASSISTANT:   ASSISTANTS: none   ANESTHESIA:   IV sedation and general  EBL: Minimal  BLOOD ADMINISTERED:none  DRAINS: none   LOCAL MEDICATIONS USED:  LIDOCAINE   SPECIMEN:  Source of Specimen:  Left vocal cord, right vocal cord, left supraglottis  DISPOSITION OF SPECIMEN:  PATHOLOGY  COUNTS:  YES  TOURNIQUET:  * No tourniquets in log *  DICTATION: .Note written in EPIC  PLAN OF CARE: Admit to inpatient   PATIENT DISPOSITION:  PACU - hemodynamically stable.   Delay start of Pharmacological VTE agent (>24hrs) due to surgical blood loss or risk of bleeding: no

## 2020-03-05 NOTE — Plan of Care (Signed)
Plan of care initiated and discussed with the patient. 

## 2020-03-05 NOTE — Op Note (Signed)
Preop diagnosis: Laryngeal mass, airway obstruction Postop diagnosis: same Procedure: Awake tracheostomy, direct laryngoscopy with biopsy Surgeon: Redmond Baseman Anesth: IV sedation and local with 1% lidocaine with 1:100,000 epinephrine followed by general Compl: None Findings: Normal anatomy for tracheostomy.  Left false vocal fold and vocal fold irregular with firm, ulcerated tissue.  Right vocal fold polypoid.  Rest of pharynx and epiglottis normal. Description:  After discussing risks, benefits, and alternatives, the patient was brought to the operative suite and placed on the operative table in a beachchair position.  Sedation was given.  The tracheostomy incision was marked with a marking pen and the area was injected with local anesthetic.  The anterior neck was prepped and draped in sterile fashion.  The incision was made using a 15 blade scalpel.  Bovie electrocautery was used to divide subcutaneous fat allowing the strap muscles to be retracted laterally exposing the thyroid isthmus.  The isthmus was divided using electocautery.  Retraction of the isthmus to either side allowed exposure of the anterior tracheal wall.  Soft tissues were swept from the trachea.  A horizontal incision was made between rings 2 and 3 with the scalpel and extended to either side with scissors.  A 2-0 silk stay suture was placed around the ring above and below the trach site.  A #6 cuffed trach was placed and the inner cannula was inserted.  The cuff was inflated.  The anesthesia circuit was attached and the patient was successfully ventilated.  General anesthesia was given.  The stay sutures were tied with two knots above and one knot below the trach site.  The trach flange was secured to the neck skin using 2-0 silk in four quadrants.  The patient was cleaned off and a trach dressing and trach tie were added.    At this point, the eyes were taped closed and bed was turned 90 degrees from anesthesia.  A tooth guard was placed  over the upper teeth and an anterior commissure laryngoscope was used to evaluate the various areas of the pharynx and larynx.  The endolarynx was examined and biopsies taken from the left vocal fold, right vocal fold, and left false vocal fold using cup forceps.  Each biopsy was passed to nursing for pathology. The laryngoscope was then removed from the patient's mouth while suctioning the airway.  The tooth guard was removed and the patient was turned back to anesthesia for wakeup and taken to the recovery room in stable condition.

## 2020-03-05 NOTE — Anesthesia Preprocedure Evaluation (Addendum)
Anesthesia Evaluation  Patient identified by MRN, date of birth, ID band Patient awake    Reviewed: Allergy & Precautions, NPO status , Patient's Chart, lab work & pertinent test results  Airway Mallampati: II       Dental  (+) Poor Dentition, Missing,    Pulmonary Current Smoker and Patient abstained from smoking.,    Pulmonary exam normal        Cardiovascular Normal cardiovascular exam     Neuro/Psych negative neurological ROS  negative psych ROS   GI/Hepatic Neg liver ROS,   Endo/Other  negative endocrine ROS  Renal/GU negative Renal ROS  negative genitourinary   Musculoskeletal negative musculoskeletal ROS (+)   Abdominal Normal abdominal exam  (+)   Peds  Hematology negative hematology ROS (+)   Anesthesia Other Findings   Reproductive/Obstetrics                            Anesthesia Physical Anesthesia Plan  ASA: III  Anesthesia Plan: MAC and General   Post-op Pain Management:    Induction: Intravenous and Inhalational  PONV Risk Score and Plan: Midazolam and Treatment may vary due to age or medical condition  Airway Management Planned: Natural Airway, Tracheostomy and Simple Face Mask  Additional Equipment: None  Intra-op Plan:   Post-operative Plan: Post-operative intubation/ventilation  Informed Consent: I have reviewed the patients History and Physical, chart, labs and discussed the procedure including the risks, benefits and alternatives for the proposed anesthesia with the patient or authorized representative who has indicated his/her understanding and acceptance.     Dental advisory given  Plan Discussed with: CRNA  Anesthesia Plan Comments:         Anesthesia Quick Evaluation

## 2020-03-05 NOTE — Anesthesia Postprocedure Evaluation (Signed)
Anesthesia Post Note  Patient: Erik [name]  Procedure(s) Performed: AWAKE TRACHEOSTOMY, DIRECT LARYNGOSCOPY WITH BIOPSY (N/A )     Patient location during evaluation: PACU Anesthesia Type: General Level of consciousness: awake and alert Pain management: pain level controlled Vital Signs Assessment: post-procedure vital signs reviewed and stable Respiratory status: spontaneous breathing, nonlabored ventilation, respiratory function stable and patient connected to nasal cannula oxygen Cardiovascular status: stable and blood pressure returned to baseline Postop Assessment: no apparent nausea or vomiting Anesthetic complications: no   No complications documented.  Last Vitals:  Vitals:   03/05/20 1900 03/05/20 1915  BP: (!) 147/91 121/90  Pulse: (!) 116 85  Resp: 15 16  Temp:    SpO2: 98% 97%    Last Pain:  Vitals:   03/05/20 1915  TempSrc:   PainSc: 0-No pain                 Effie Berkshire

## 2020-03-06 ENCOUNTER — Encounter (HOSPITAL_COMMUNITY): Payer: Self-pay | Admitting: Otolaryngology

## 2020-03-06 MED ORDER — ORAL CARE MOUTH RINSE
15.0000 mL | Freq: Two times a day (BID) | OROMUCOSAL | Status: DC
  Administered 2020-03-06 – 2020-03-10 (×8): 15 mL via OROMUCOSAL

## 2020-03-06 NOTE — Evaluation (Addendum)
SLP Cancellation Note  Patient Details Name: Erik [name] MRN: [id number] DOB: [birth year]   Cancelled treatment:       Reason Eval/Treat Not Completed: Other (comment);Patient declined, no reason specified (per RN, Dr Wilburn Cornelia stated pt was not ready for speech PMSV and RN reports pt with copious secretions, will continue efforts) Will continue efforts for PMSV, swallow eval and laryngectomy education.  Thanks for these orders.   Kathleen Lime, MS [facility] SLP Acute Rehab Services Office [phone] Pager [phone]    Macario Golds 03/06/2020, 4:31 PM

## 2020-03-06 NOTE — Progress Notes (Signed)
Patient was successfully and safely transferred with [facility]

## 2020-03-06 NOTE — Progress Notes (Signed)
ENT Progress Note: POD #1 s/p Procedure(s): AWAKE TRACHEOSTOMY, DIRECT LARYNGOSCOPY WITH BIOPSY   Subjective: Patient stable, no respiratory difficulty  Objective: Vital signs in last 24 hours: Temp:  [97.4 F (36.3 C)-99.8 F (37.7 C)] 99.8 F (37.7 C) (02/05 0800) Pulse Rate:  [51-116] 77 (02/05 0919) Resp:  [15-28] 26 (02/05 0919) BP: (112-156)/(86-113) 156/90 (02/05 0919) SpO2:  [91 %-100 %] 92 % (02/05 0919) FiO2 (%):  [28 %-35 %] 35 % (02/05 0919) Weight change:  Last BM Date: 03/04/20  Intake/Output from previous day: 02/04 0701 - 02/05 0700 In: 1116.3 [I.V.:1116.3] Out: 400 [Urine:350; Blood:50] Intake/Output this shift: No intake/output data recorded.  Labs: Recent Labs    03/04/20 1219  WBC 8.4  HGB 14.0  HCT 42.5  PLT 353   Recent Labs    03/04/20 1219  NA 141  K 3.6  CL 102  CO2 29  GLUCOSE 104*  BUN 21*  CALCIUM 9.6    Studies/Results: DG Chest 2 View  Result Date: 03/04/2020 CLINICAL DATA:  COPD exacerbation EXAM: CHEST - 2 VIEW COMPARISON:  11/25/2019 FINDINGS: The heart size and mediastinal contours are within normal limits. Atherosclerotic calcification of the aortic knob. Mildly hyperexpanded lungs. No focal airspace consolidation, pleural effusion, or pneumothorax. The visualized skeletal structures are unremarkable. IMPRESSION: No active cardiopulmonary disease. Electronically Signed   By: Davina Poke D.O.   On: 03/04/2020 15:58   CT Soft Tissue Neck W Contrast  Result Date: 03/04/2020 CLINICAL DATA:  Soft tissue mass, neck, no prior imaging; smoker, hoarseness different than past, external neck swelling, patient reports sensation of internal edema. Additional history provided: Patient reports sore throat and shortness of breath since last Thursday, throat swelling. EXAM: CT NECK WITH CONTRAST TECHNIQUE: Multidetector CT imaging of the neck was performed using the standard protocol following the bolus administration of intravenous  contrast. CONTRAST:  22mL OMNIPAQUE IOHEXOL 300 MG/ML  SOLN COMPARISON:  Neck CT 09/29/2005. FINDINGS: Mildly motion degraded examination. Pharynx and larynx: Streak artifact from dental restoration partially obscures the oral cavity. No appreciable swelling or discrete mass within the oral cavity or pharynx. There is mucosal irregularity as well as mucosal/submucosal edema of the supraglottic and glottic larynx. Resultant mild-to-moderate narrowing and mild rightward deviation of the airway at this level. There is additional edema within the ventral neck. Salivary glands: No inflammation, mass, or stone. Thyroid: Unremarkable. Lymph nodes: Enlarged left level 2/3 lymph node measuring 14 mm in short axis (series 6, image 62) Vascular: The major vascular structures of the neck are patent. Aortic atherosclerosis. Minimal atherosclerotic plaque within the left carotid bifurcation and proximal ICA. Limited intracranial: No acute intracranial abnormality identified. Visualized orbits: Excluded from the field of view. Mastoids and visualized paranasal sinuses: Trace ethmoid sinus mucosal thickening at the imaged levels. No significant mastoid effusion. Skeleton: No acute bony abnormality or aggressive osseous lesion. Upper chest: No consolidation within the imaged lung apices. Centrilobular emphysema. Other: Nonspecific 1.4 cm part cystic/part solid cutaneous/subcutaneous lesion within the midline upper neck (series 2, image 42). These results were called by telephone at the time of interpretation on 03/04/2020 at 2:48 pm to provider PA Sherol Dade , who verbally acknowledged these results. IMPRESSION: Mucosal irregularity and mucosal/submucosal edema of the supraglottic and glottic larynx. Primary differential considerations include infectious/inflammatory laryngeal edema versus laryngeal malignancy. ENT consultation and direct visualization recommended. Associated mild-to-moderate narrowing, and mild rightward  deviation, of the airway at this level. Additional edema within the cutaneous/subcutaneous ventral neck. Mildly enlarged left  level 2/3 lymph node which may be reactive or could reflect metastatic nodal disease. Nonspecific 1.4 cm part cystic/part solid cutaneous/subcutaneous lesion within the midline upper neck. Direct visualization recommended. Emphysema (ICD10-J43.9). Aortic Atherosclerosis (ICD10-I70.0). 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  Patient stable overnight in stepdown, minimal coughing and no respiratory concerns.  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    Jerrell Belfast 03/06/2020, 9:46 AM

## 2020-03-06 NOTE — Progress Notes (Addendum)
   03/06/20 2041  ECG Monitoring  Ectopy Other (Comment) (ST Elevation 2.1 V lead. Notified Tamaya Pun)  Patient is alert and oriented. Denies any chest pain. V/S BP149/96 T98.6 PR88 RR15 02sat 96% 8L/trachcollar Notified Heron Nay @2205 , no new orders received. Will continue to close monitor patient.

## 2020-03-07 NOTE — Evaluation (Signed)
Clinical/Bedside Swallow Evaluation Patient Details  Name: [name] MRN: [id number] Date of Birth: [birth year]  Today's Date: 03/07/2020 Time: SLP Start Time (ACUTE ONLY): 1135 SLP Stop Time (ACUTE ONLY): 1145 SLP Time Calculation (min) (ACUTE ONLY): 10 min  Past Medical History:  Past Medical History:  Diagnosis Date  . Coronary artery disease   . Dyspnea   . GERD (gastroesophageal reflux disease)    Phreesia 02/21/2020  . Ruptured lumbar intervertebral disc    Past Surgical History:  Past Surgical History:  Procedure Laterality Date  . CARDIAC CATHETERIZATION     with stent placement  . TONSILLECTOMY    . TRACHEOSTOMY TUBE PLACEMENT N/A 03/05/2020   Procedure: AWAKE TRACHEOSTOMY, DIRECT LARYNGOSCOPY WITH BIOPSY;  Surgeon: Melida Quitter, MD;  Location: [facility];  Service: ENT;  Laterality: N/A;   HPI:  55 year old male with six months of worsening hoarseness and throat pain previously treated presumptively for reflux but not really improving.  For the past week, he has had worsening difficulty breathing and came to the ER for this reason where he has been treated with steroids and racemic epinephrine without improvement.  A neck CT scan demonstrates abnormal soft tissue in the larynx with partial airway obstruction.  Now s/p trach (Shiley 6 cuffed) and laryngoscopy with biopsy by ENT.   Assessment / Plan / Recommendation Clinical Impression  Pt presents with functional swallowing ability as assessed clincally.  Pt tolerated all conistencies trialed with PMV in place with no clinical s/s of aspiration.  Pt participated in additional trials without PMV.  There was a single cough with thin liquid from straw.  Pt brought up a large amount of secretions.  Pt denied feeling of liquid going down the wrong way at cough was most likely for secretion management reasons.  There were no further clinical s/s of aspiration with additional straw sips.   Pt may advance to soft diet with thin liquids  per ENT, and can resume regular texture diet when medically appropriate. It is strongly recommended (though not required) that pt wear PMV with oral intake.  SLP Visit Diagnosis: Dysphagia, unspecified (R13.10)    Aspiration Risk  Mild aspiration risk    Diet Recommendation Regular;Thin liquid   Liquid Administration via: Straw;Cup Medication Administration: Whole meds with liquid Supervision: Patient able to self feed Compensations: Slow rate;Small sips/bites Postural Changes: Seated upright at 90 degrees    Other  Recommendations Oral Care Recommendations: Oral care BID Other Recommendations: Place PMSV during PO intake   Follow up Recommendations Outpatient SLP      Frequency and Duration min 2x/week  2 weeks       Prognosis Prognosis for Safe Diet Advancement: Good      Swallow Study   General HPI: 55 year old male with six months of worsening hoarseness and throat pain previously treated presumptively for reflux but not really improving.  For the past week, he has had worsening difficulty breathing and came to the ER for this reason where he has been treated with steroids and racemic epinephrine without improvement.  A neck CT scan demonstrates abnormal soft tissue in the larynx with partial airway obstruction.  Now s/p trach (Shiley 6 cuffed) and laryngoscopy with biopsy by ENT. Type of Study: Bedside Swallow Evaluation Previous Swallow Assessment: none Diet Prior to this Study: Dysphagia 3 (soft);Thin liquids Temperature Spikes Noted: No Respiratory Status: Trach Collar History of Recent Intubation: No Behavior/Cognition: Alert;Cooperative;Pleasant mood Oral Cavity Assessment: Within Functional Limits Oral Care Completed by  SLP: No Oral Cavity - Dentition: Adequate natural dentition Vision: Functional for self-feeding Self-Feeding Abilities: Able to feed self Patient Positioning: Upright in bed Baseline Vocal Quality: Hoarse Volitional Cough: Strong Volitional  Swallow: Able to elicit    Oral/Motor/Sensory Function Overall Oral Motor/Sensory Function: Within functional limits Facial ROM: Within Functional Limits Facial Symmetry: Within Functional Limits Lingual ROM: Within Functional Limits Lingual Symmetry: Within Functional Limits Lingual Strength: Within Functional Limits Velum: Within Functional Limits Mandible: Within Functional Limits   Ice Chips Ice chips: Not tested   Thin Liquid Thin Liquid: Impaired Presentation: Cup;Straw Pharyngeal  Phase Impairments: Cough - Immediate (x1 (No futher s/s with additional trials))    Nectar Thick Nectar Thick Liquid: Not tested   Honey Thick Honey Thick Liquid: Not tested   Puree Puree: Within functional limits Presentation: Spoon   Solid     Solid: Within functional limits Presentation: [place], [place], [place] Office: [phone] 03/07/2020,12:23 PM

## 2020-03-07 NOTE — Progress Notes (Signed)
   ENT Progress Note: POD #2 s/p Procedure(s): AWAKE TRACHEOSTOMY, DIRECT LARYNGOSCOPY WITH BIOPSY   Subjective: GERD with coughing and secretions  Objective: Vital signs in last 24 hours: Temp:  [97.8 F (36.6 C)-100.1 F (37.8 C)] 97.8 F (36.6 C) (02/06 0410) Pulse Rate:  [65-94] 92 (02/06 0839) Resp:  [15-24] 18 (02/06 0839) BP: (149-162)/(66-96) 149/66 (02/06 0410) SpO2:  [89 %-96 %] 95 % (02/06 0839) FiO2 (%):  [35 %] 35 % (02/06 0839) Weight change:  Last BM Date: 03/06/20  Intake/Output from previous day: 02/05 0701 - 02/06 0700 In: 3701.6 [P.O.:630; I.V.:3071.6] Out: 1050 [Urine:1050] Intake/Output this shift: No intake/output data recorded.  Labs: Recent Labs    03/04/20 1219  WBC 8.4  HGB 14.0  HCT 42.5  PLT 353   Recent Labs    03/04/20 1219  NA 141  K 3.6  CL 102  CO2 29  GLUCOSE 104*  BUN 21*  CALCIUM 9.6    Studies/Results: No results found.   PHYSICAL EXAM: Tracheostomy tube in place, patient stable.  Moderate secretions. No shortness of breath, chest pain or other active cardiac symptoms.   Assessment/Plan: Patient stable, continue to advance diet as tolerated.  Continue trach teaching.  Plan downsizing trach and Passy-Muir valve use when secretions have diminished.  Patient stable from a cardiac standpoint.  No active symptoms or concerns.  Plan ambulation.    Jerrell Belfast 03/07/2020, 9:31 AM

## 2020-03-07 NOTE — Evaluation (Signed)
Passy-Muir Speaking Valve - Evaluation Patient Details  Name: Erik [name] MRN: [id number] Date of Birth: 06-26-1965  Today's Date: 03/07/2020 Time: 1110-1135 SLP Time Calculation (min) (ACUTE ONLY): 25 min  Past Medical History:  Past Medical History:  Diagnosis Date  . Coronary artery disease   . Dyspnea   . GERD (gastroesophageal reflux disease)    Phreesia 02/21/2020  . Ruptured lumbar intervertebral disc    Past Surgical History:  Past Surgical History:  Procedure Laterality Date  . CARDIAC CATHETERIZATION     with stent placement  . TONSILLECTOMY    . TRACHEOSTOMY TUBE PLACEMENT N/A 03/05/2020   Procedure: AWAKE TRACHEOSTOMY, DIRECT LARYNGOSCOPY WITH BIOPSY;  Surgeon: Melida Quitter, MD;  Location: [facility];  Service: ENT;  Laterality: N/A;   HPI:  55 year old male with six months of worsening hoarseness and throat pain previously treated presumptively for reflux but not really improving.  For the past week, he has had worsening difficulty breathing and came to the ER for this reason where he has been treated with steroids and racemic epinephrine without improvement.  A neck CT scan demonstrates abnormal soft tissue in the larynx with partial airway obstruction.  Now s/p trach (Shiley 6 cuffed) and laryngoscopy with biopsy by ENT.   Assessment / Plan / Recommendation Clinical Impression  Pt was able to tolerate PMV for entirety of today's session - 35 minutes, with brief periods or removal for donning/doffing training and PO trials without valve in place.  Pt was noted to bring secretions up at trach site with strong cough on SLP arrival.  Deflated cuff with approval from RN which resulted in immediate cough with pt bringing secretions to oral cavity without PMV placement.  Pt was able to phonate with finger occlusion.  No breath stacking/back pressure noted after 5 breaths.  Placed PMV with stable vital signs for remainder of session.  With PMV in place, pt exhibited excellent  secretion management, bringing up secretions to be expectorated orally.  There was some decrease in O2 sats with coughing, in one case to 77, but this quickly rebouned, and pt denied any feeling of shortness of breath or increased work of breathing.  Pt's vocal quality was very hoarse with low vocal intensity.  Pt reports that this is consistent with his baseline vocal quality 2/2 laryngeal mass.  With valve in place, pt is 100% intelligible.  Pt demonstrated ability to don/doff PMV independently.  Pt was able to do this by feel.  Pt also utilized camera function on phone to visualize trach.  Provided education to patient on when to remove PMV (sleeping, changes in vital signs, discomfort/increased WOB).  Provided education regarding cuff deflation and attached warning lable to cuff balloon line.  Secured PMV to trach collar with leash.  Posted PMV usage sign in room at head of bed.    Pt may wear PMV during all waking hours.  SLP will follow for PMV tolerance and education and to provide additional education and intervention as needed pending results of laryngeal biopsy.  SLP Visit Diagnosis: Aphonia (R49.1)    SLP Assessment  Patient needs continued Speech Lanaguage Pathology Services    Follow Up Recommendations  Outpatient SLP    Frequency and Duration min 2x/week  2 weeks    PMSV Trial PMSV was placed for: 35 minutes Able to redirect subglottic air through upper airway: Yes Able to Attain Phonation: Yes Voice Quality: Hoarse;Low vocal intensity Able to Expectorate Secretions: Yes Level of Secretion Expectoration with  PMSV: Oral Breath Support for Phonation: Adequate Intelligibility: Intelligible Respirations During Trial:  (not available on tele) SpO2 During Trial: 92 % (87-94) Pulse During Trial: 93 Behavior: Alert;Controlled;Cooperative;Expresses self well;Good eye contact;Responsive to questions   Tracheostomy Tube       Vent Dependency  Vent Dependent: No FiO2 (%): 35 %     Cuff Deflation Trial  GO Tolerated Cuff Deflation: Yes Length of Time for Cuff Deflation Trial: 35 Behavior: Alert;Controlled;Cooperative;Expresses self well;Good eye contact;Smiling;Responsive to questions        [place] 03/07/2020, 12:37 PM

## 2020-03-07 NOTE — Progress Notes (Signed)
Patient arrived to [phone], alert and oriented, tracheostomy in place and patent. on 8L/Tracheostomy collar,Sp02 96%, Fi02 35%. No acute distress noted. no DOB/ SOB noted. HOB elevated. Denies any pain at this moment.    V/S BP149/96 PR88 RR15 T98.6. will continue to close monitor patient

## 2020-03-08 MED ORDER — ENSURE ENLIVE PO LIQD
237.0000 mL | Freq: Three times a day (TID) | ORAL | Status: DC
  Administered 2020-03-08 – 2020-03-09 (×5): 237 mL via ORAL

## 2020-03-08 MED ORDER — ADULT MULTIVITAMIN W/MINERALS CH
1.0000 | ORAL_TABLET | Freq: Every day | ORAL | Status: DC
  Administered 2020-03-08 – 2020-03-10 (×3): 1 via ORAL
  Filled 2020-03-08 (×3): qty 1

## 2020-03-08 NOTE — Progress Notes (Signed)
   03/08/20 0926  Vitals  Temp 98.2 F (36.8 C)  Temp Source Oral  BP 108/77  MAP (mmHg) 88  BP Location Left Arm  BP Method Automatic  Patient Position (if appropriate) Lying  Pulse Rate (!) 115  Pulse Rate Source Monitor  Resp 16  MEWS COLOR  MEWS Score Color Yellow  Oxygen Therapy  SpO2 95 %  O2 Device Tracheostomy Collar  Pain Assessment  Pain Scale 0-10  Pain Score 7  Pain Type Surgical pain  Pain Location Throat  Pain Orientation Mid  Pain Descriptors / Indicators Sore  Pain Frequency Constant  Pain Onset On-going  Patients Stated Pain Goal 2  Pain Intervention(s) Medication (See eMAR)  POSS Scale ([place]ero Opioid Sedation Scale)  POSS *See Group Information* 1-Acceptable,Awake and alert  MEWS Score  MEWS Temp 0  MEWS Systolic 0  MEWS Pulse 2  MEWS RR 0  MEWS LOC 0  MEWS Score 2  Provider Notification  Provider Name/Title Dr. Redmond Baseman  Date Provider Notified 03/08/20  Time Provider Notified [phone]  Notification Type Call  Notification Reason Change in status  Response Other (Comment) (Office will notify MD of changes)  Rapid Response Notification  Name of Rapid Response RN Notified Konrad Dolores RN  Date Rapid Response Notified 03/08/20  Time Rapid Response Notified 3300

## 2020-03-08 NOTE — Progress Notes (Signed)
Subjective: Doing fairly well.  Swallowing decently.  Objective: Vital signs in last 24 hours: Temp:  [98.2 F (36.8 C)-99.2 F (37.3 C)] 98.3 F (36.8 C) (02/07 1125) Pulse Rate:  [85-118] 95 (02/07 1227) Resp:  [15-18] 18 (02/07 1227) BP: (108-162)/(77-116) 132/88 (02/07 1125) SpO2:  [91 %-98 %] 93 % (02/07 1227) FiO2 (%):  [21 %-28 %] 21 % (02/07 1227) Wt Readings from Last 1 Encounters:  03/04/20 59 kg    Intake/Output from previous day: 02/06 0701 - 02/07 0700 In: 420 [P.O.:420] Out: 425 [Urine:425] Intake/Output this shift: No intake/output data recorded.  General appearance: alert, cooperative and no distress Neck: trach site stable, cuff deflated, Passy-Muir valve in place, whispery voice  No results for input(s): WBC, HGB, HCT, PLT in the last 72 hours.  No results for input(s): NA, K, CL, CO2, GLUCOSE, BUN, CREATININE, CALCIUM in the last 72 hours.  Medications: I have reviewed the patient's current medications.  Assessment/Plan: Laryngeal cancer s/p tracheostomy  Doing well.  Pathology still pending.  Plan trach change and discharge in two days.   LOS: 4 days   Melida Quitter 03/08/2020, 12:54 PM

## 2020-03-08 NOTE — TOC Initial Note (Addendum)
Transition of Care [name]) - Initial/Assessment Note    Patient Details  Name: Erik [name] MRN: [id number] Date of Birth: 08/25/1965  Transition of Care [name]) CM/SW Contact:    Erik Favre, RN Phone Number: 03/08/2020, 11:37 AM  Clinical Narrative:                  Spoke to Dr Erik [name] via phone. Plan to discharge patient to [name] Wednesday with cuffless 6 trach, humidified air at 5L/ 28% and suction.    Patient from [name] with wife Erik [name] . Confirmed face sheet information and Erik [name]'s cell phone number.  Explained above to patient.Patient has purple  passey muir valve. Prior to discharge respiratory will provide teaching to him and wife. [facility] will bring portable  Suction to patient's room.   NCM trying to  arrange [name].   [name] does not take new tracheostomy    [facility] does not have staffing.  [facility] does not have staffing.  [facility] does not have staffing.  [facility] does not take insurance.   Left Erik [name] with Erik [name] will review.  Erik [name] will review they do take new tracheostomy, however not sure office will accept Erik [name] insurance. Erik [name] does not accept insurance.  Erik [name] with [name] checking to see if she can accept. Erik [name] is not accepting new tracheostomy at this time.    Erik [name] they do not have staffing in [place].  Erik [name] only accepts tracheostomy that are greater than 78 days old   Erik [name] they do not accept new tracheostomy.  E[name] , spoke to Erik [name] they do not have staffing to accept patient.   E[name] spoke to Erik [name] they do not accept new tracheostomy.   Unable to arrange [name]. Messaged Dr Erik [name].   Ordered supplies through Erik [name] with [facility] . He needs to know id MD plans on discharging patient with 6 cuffless trach with disposable inter cannula or not , fenestrated tracheostomy or unfenestrated tracheostomy. Messaged Dr Erik [name]  Dr Erik [name] responded not fenestrated.  Disposable or not disposable inner cannula will depend on what [name] supplies him to to change . Will ask  charge nurse to order 6 cuffless today.   Erik [name] with [place]  updated.    Patient will need to have a trach one size smaller and ambu bag at discharge to take [name].    Called Respiratory therapy spoke to Erik [name] to arrange patient and wife education. Erik [name]'s number provided.   Unable to arrange [name] RN. NCM has called respiratory therapy to arrange teaching with patient and wife. NCM also asked bedside nurse and charge nurse if they can assist with teaching.   Patient will need a mirror.   Patient and Dr Erik [name] aware of above. Patient will need to stay at [name] until him and wife comfortable with trach care.  Expected Discharge Plan: [place] Barriers to Discharge: Continued Medical Work up   Patient Goals and CMS Choice Patient states their goals for this hospitalization and ongoing recovery are:: to return to [name] CMS Medicare.gov Compare Post Acute Care list provided to:: Patient Choice offered to / list presented to : Patient  Expected Discharge Plan and Services Expected Discharge Plan: [place]   Discharge Planning Services: CM Consult Post Acute Care Choice: [place] arrangements for Erik past 2 months: [place]  DME Arranged: Suction,Trach supplies DME Agency: [facility]       HH Arranged: RN [place] Agency: Erik ([facility]) Date [place]: 03/08/20 Time [place]: 1134 Representative spoke with at [place]: checking to see if they can accept  Prior Living Arrangements/Services Living arrangements for Erik past 2 months: [place] with:: Spouse Patient language and need for interpreter reviewed:: Yes Do you feel safe going back to Erik place where you live?: Yes      Need for Family Participation in Patient Care: Yes  (Comment) Care giver support system in place?: Yes (comment)   Criminal Activity/Legal Involvement Pertinent to Current Situation/Hospitalization: No - Comment as needed  Activities of Daily Living [name] Assistive Devices/Equipment: Eyeglasses ADL Screening (condition at time of admission) Patient's cognitive ability adequate to safely complete daily activities?: Yes Is Erik patient deaf or have difficulty hearing?: No Does Erik patient have difficulty seeing, even when wearing glasses/contacts?: No Does Erik patient have difficulty concentrating, remembering, or making decisions?: No Patient able to express need for assistance with ADLs?: Yes Does Erik patient have difficulty dressing or bathing?: No Independently performs ADLs?: Yes (appropriate for developmental age) Does Erik patient have difficulty walking or climbing stairs?: Yes (gets sob) Weakness of Legs: None Weakness of Arms/Hands: None  Permission Sought/Granted   Permission granted to share information with : No              Emotional Assessment Appearance:: Appears stated age Attitude/Demeanor/Rapport: Engaged Affect (typically observed): Accepting Orientation: : Oriented to Self,Oriented to Place,Oriented to  Time,Oriented to Situation Alcohol / Substance Use: Not Applicable Psych Involvement: No (comment)  Admission diagnosis:  Airway obstruction [J98.8] Laryngeal cancer [name]) [C32.9] Patient Active Problem List   Diagnosis Date Noted  . Malnutrition of moderate degree 03/05/2020  . Laryngeal cancer ([place]) 03/04/2020   PCP:  Patient, No Pcp Per Pharmacy:   [facility], [place] [address] [place] [address] Phone: [phone] Fax: [phone]     Social Determinants of Health ([place]) Interventions    Readmission Risk Interventions No flowsheet data found.

## 2020-03-08 NOTE — Progress Notes (Signed)
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  Ongoing  GOAL:   Patient will meet greater than or equal to 90% of their needs  Progressing   MONITOR:   Labs,Weight trends,I & O's,Diet advancement  REASON FOR ASSESSMENT:   Malnutrition Screening Tool    ASSESSMENT:   55 year old male with six months of worsening hoarseness and throat pain previously treated presumptively for reflux but not really improving.  For the past week, he has had worsening difficulty breathing and came to the ER for this reason where he has been treated with steroids and racemic epinephrine without improvement.  A neck CT scan demonstrates abnormal soft tissue in the larynx with partial airway obstruction.  2/4- s/p Procedure: Awake tracheostomy, direct laryngoscopy with biopsy 2/6- s/p BSE- advanced to soft diet  Reviewed I/O's: -5 ml x 24 hours and +4.2 L since admission  UOP: 425 ml x 24 hours  Pt resting quietly at time of visit. He did not arose to name being called.   Per RN, pt tolerating current diet texture well. RD will add supplements for additional nutrition support.   Per [facility], plan to discharge home on Wednesday, 03/10/20.   Labs reviewed: CBGS: 102.  Diet Order:   Diet Order            DIET SOFT Room service appropriate? Yes; Fluid consistency: Thin  Diet effective now                 EDUCATION NEEDS:   No education needs have been identified at this time  Skin:  Skin Assessment: Skin Integrity Issues: Skin Integrity Issues:: Incisions Incisions:  closed neck s/p trach  Last BM:  03/06/20  Height:   Ht Readings from Last 1 Encounters:  03/04/20 5\' 8"  (1.727 m)    Weight:   Wt Readings from Last 1 Encounters:  03/04/20 59 kg    Ideal Body Weight:  70 kg  BMI:  Body mass index is 19.77 kg/m.  Estimated Nutritional Needs:   Kcal:  5701-7793  Protein:  85-100g  Fluid:  1.9L/day    Loistine Chance, RD, LDN, [place] Registered Dietitian II Certified Diabetes Care and Education Specialist Please refer to [facility] for RD and/or RD on-call/weekend/after hours pager

## 2020-03-09 NOTE — Care Management (Addendum)
1150 Respiratory therapy currently at bedside talking with patient and his wife Erik [name] regarding education. Erik [name] aware unable to arrange a HHRN. She feels comfortable being taught trach care.   NCM asked respiratory to contact patient's wife for education. They will also need more education from nursing. I am unable to arrange a HHRN. I ordered supplies from [employer], they are asking that at [place] provides 3 to 5 days of supplies including replacement trach, trach ties and ambu bag and trach cleaning kits   Magdalen Spatz RN

## 2020-03-09 NOTE — Progress Notes (Signed)
2/8/2022Lurline [name] education done with patient and wife.  Education booklet given to wife Monday.  Questions answered with both wife and patient.  They both able to show verbal understanding and asked appropriate questions pertaining to trach care and safety at home.  Trach care supplies at bedside. Wife aware of respiratory contact number if additional questions arise.

## 2020-03-09 NOTE — Progress Notes (Signed)
Subjective: Feeling good.  Swallowing well.  Objective: Vital signs in last 24 hours: Temp:  [98 F (36.7 C)-99.5 F (37.5 C)] 98.2 F (36.8 C) (02/08 0412) Pulse Rate:  [89-115] 95 (02/08 0816) Resp:  [16-19] 18 (02/08 0816) BP: (108-157)/(77-92) 127/83 (02/08 0412) SpO2:  [90 %-99 %] 90 % (02/08 0816) FiO2 (%):  [21 %-28 %] 21 % (02/08 0816) Wt Readings from Last 1 Encounters:  03/04/20 59 kg    Intake/Output from previous day: 02/07 0701 - 02/08 0700 In: 220 [P.O.:220] Out: 250 [Urine:250] Intake/Output this shift: No intake/output data recorded.  General appearance: alert, cooperative and no distress Neck: tracheostomy site stable, trach tube in place with cuff deflated, Passy-Muir valve in place, whispery voice  No results for input(s): WBC, HGB, HCT, PLT in the last 72 hours.  No results for input(s): NA, K, CL, CO2, GLUCOSE, BUN, CREATININE, CALCIUM in the last 72 hours.  Medications: I have reviewed the patient's current medications.  Assessment/Plan: Laryngeal cancer s/p tracheostomy  Pathology still pending.  Reviewed trach care.  Plan trach change and discharge tomorrow.   LOS: 5 days   Erik [name] 03/09/2020, 8:18 AM

## 2020-03-10 ENCOUNTER — Ambulatory Visit (HOSPITAL_COMMUNITY): Payer: 59

## 2020-03-10 LAB — SURGICAL PATHOLOGY

## 2020-03-10 NOTE — Discharge Summary (Signed)
Physician Discharge Summary  Patient ID: Erik [name] MRN: [id number] DOB/AGE: 03/24/1965 55 y.o.  Admit date: 03/04/2020 Discharge date: 03/10/2020  Admission Diagnoses: Laryngeal cancer, airway obstruction, malnutrition  Discharge Diagnoses:  Active Problems:   Laryngeal cancer (HCC)   Malnutrition of moderate degree   Discharged Condition: good  Hospital Course: 55 year old male presented to the ER with symptoms of airway obstruction and was found to have a laryngeal mass.  He was admitted to the hospital and then underwent elective tracheostomy and direct laryngoscopy with biopsy.  He was observed overnight after surgery in ICU and was then transferred to a regular room.  His hospital stay was uneventful.  On POD 5, his trach tube was changed to a cuffless trach tube and he was felt stable for discharge.  His positive biopsy was discussed with him prior to discharge.  Further evaluation will be arranged as an outpatient.  Consults: None  Significant Diagnostic Studies: CT neck  Treatments: surgery: Awake tracheostomy, direct laryngoscopy with biopsy  Discharge Exam: Blood pressure 129/84, pulse 93, temperature 98.5 F (36.9 C), temperature source Oral, resp. rate 20, height 5\' 8"  (1.727 m), weight 59 kg, SpO2 96 %. General appearance: alert, cooperative and no distress Neck: tracheostomy tube changed to cuffless #6 Shiley trach without difficulty, stoma healing, whispery voice  Disposition: Discharge disposition: 01-Home or Self Care       Discharge Instructions    Diet - low sodium heart healthy   Complete by: As directed    Discharge instructions   Complete by: As directed    Suction trach tube if needed.  Use speaking valve when awake but take off for sleep.  OK to use washcloth around tracheostomy tube.  Avoid water getting into tracheostomy.  Normal diet and activity as able.   Increase activity slowly   Complete by: As directed    No wound care   Complete by: As  directed      Allergies as of 03/10/2020   No Known Allergies     Medication List    TAKE these medications   albuterol 108 (90 Base) MCG/ACT inhaler Commonly known as: VENTOLIN HFA Inhale 2 puffs into the lungs every 6 (six) hours as needed for wheezing or shortness of breath.   benzonatate 100 MG capsule Commonly known as: [name] Take 1 capsule (100 mg total) by mouth 3 (three) times daily as needed for cough.   cholecalciferol 25 MCG (1000 UNIT) tablet Commonly known as: VITAMIN D3 Take 1,000 Units by mouth daily.   ibuprofen 200 MG tablet Commonly known as: ADVIL Take 800 mg by mouth every 8 (eight) hours as needed for mild pain. For pain   multivitamin with minerals Tabs tablet Take 1 tablet by mouth daily.   omeprazole 20 MG capsule Commonly known as: PRILOSEC Take 1 capsule (20 mg total) by mouth 2 (two) times daily before a meal. What changed: when to take this       Follow-up Information    Melida Quitter, MD. Schedule an appointment as soon as possible for a visit on 03/15/2020.   Specialty: Otolaryngology Contact information: [address] [place] [address] [phone]               Signed: Melida Quitter 03/10/2020, 9:03 AM

## 2020-03-10 NOTE — Progress Notes (Signed)
  Speech Language Pathology Treatment: Cognitive-Linquistic;Passy Muir Speaking valve  Patient Details Name: Erik [name] MRN: [id number] DOB: 1965/10/06 Today's Date: 03/10/2020 Time: 1040-1100 SLP Time Calculation (min) (ACUTE ONLY): 20 min  Assessment / Plan / Recommendation Clinical Impression  Pt demonstrates adequate knowledge of use and precautions with PMSV. Pt demonstrated independent removal and placement, reports no discomfort. Reiterated safety precautions and explained cleaning method.  Pt reports he is not sure what his treatment plan will be. SLP provided some education to pt regarding the anatomical difference between tracheostomy and total laryngectomy with printed educational materials. Also showed pt a picture of a stoma and a larytube and a video of a pt with laryngectomy and an electrolarynx and also a TEP to demonstrate the different potential options for speech following laryngectomy. Emphasized the importance of f/u with OP SLP if pt does choose to have radiation in order to combat potential dysphagia. Pt did not have any follow up questions at this time.   HPI HPI: 55 year old male with six months of worsening hoarseness and throat pain previously treated presumptively for reflux but not really improving.  For the past week, he has had worsening difficulty breathing and came to the ER for this reason where he has been treated with steroids and racemic epinephrine without improvement.  A neck CT scan demonstrates abnormal soft tissue in the larynx with partial airway obstruction.  Now s/p trach (Shiley 6 cuffed) and laryngoscopy with biopsy by ENT.      SLP Plan  All goals met       Recommendations  Diet recommendations: Regular;Thin liquid      Patient may use Passy-Muir Speech Valve: During all waking hours (remove during sleep)         Follow up Recommendations: Outpatient SLP SLP Visit Diagnosis: Aphonia (R49.1) Plan: All goals met       GO                Erik Baltimore, MA [place] Pager [phone] Office [phone]  Lynann Beaver 03/10/2020, 11:18 AM

## 2020-03-10 NOTE — TOC Progression Note (Signed)
Transition of Care [facility]) - Progression Note    Patient Details  Name: Erik [name] MRN: [id number] Date of Birth: 08-31-1965  [facility] CM/SW Contact  Jacalyn Lefevre Edson Snowball, RN Phone Number: 03/10/2020, 11:49 AM  Clinical Narrative:     Spoke to patient at bedside. He states respiratory and nursing provided  trach teaching to him and his wife and they feel comfortable with caring for trach at home.   NCM spoke to bedside nurse regarding supplies for home. She will send 3 to 5 days worth of trach ties , trach cleaning kits, replacement trach and ambu bag with him. And answer any trach questions he may have prior to discharge.   [facility] will bring portable suction machine to room prior to discharge. Patient and nurse aware.  NCM faxed signed order form for trach, supplies, suction, humidified air resuscitator bag , suction catheters( tracheal and oral)  speaking valve , trach care kits , trach tube holder to [place] with [place].  Expected Discharge Plan: [place] Barriers to Discharge: Continued Medical Work up  Expected Discharge Plan and Services Expected Discharge Plan: [place]   Discharge Planning Services: CM Consult Post Acute Care Choice: [place] arrangements for the past 2 months: Single Family Home Expected Discharge Date: 03/10/20               DME Arranged: Arturo Morton supplies DME Agency: [employer]       HH Arranged: RN [place] Agency: [place] ([facility]) Date HH Agency Contacted: 03/08/20 Time [place]: 1134 Representative spoke with at [place]: checking to see if they can accept   Social Determinants of Health (SDOH) Interventions    Readmission Risk Interventions No flowsheet data found.

## 2020-03-15 ENCOUNTER — Ambulatory Visit (INDEPENDENT_AMBULATORY_CARE_PROVIDER_SITE_OTHER): Payer: 59 | Admitting: Otolaryngology

## 2020-03-15 ENCOUNTER — Telehealth: Payer: Self-pay | Admitting: Hematology and Oncology

## 2020-03-15 NOTE — Telephone Encounter (Signed)
Received an urgent referral from Dr. Redmond Baseman for Malignant neoplasm of larynx. Erik [name] has been cld and scheduled to see Erik [name] on 2/15 at 1pm. Pt aware to arrive 20 minutes early.

## 2020-03-16 ENCOUNTER — Inpatient Hospital Stay: Payer: 59 | Attending: Hematology and Oncology | Admitting: Hematology and Oncology

## 2020-03-16 ENCOUNTER — Other Ambulatory Visit: Payer: Self-pay

## 2020-03-16 ENCOUNTER — Encounter: Payer: Self-pay | Admitting: *Deleted

## 2020-03-16 ENCOUNTER — Encounter: Payer: Self-pay | Admitting: Hematology and Oncology

## 2020-03-16 ENCOUNTER — Inpatient Hospital Stay: Payer: 59

## 2020-03-16 VITALS — BP 134/89 | HR 72 | Temp 99.2°F | Resp 16 | Ht 68.0 in | Wt 122.2 lb

## 2020-03-16 DIAGNOSIS — F17210 Nicotine dependence, cigarettes, uncomplicated: Secondary | ICD-10-CM | POA: Insufficient documentation

## 2020-03-16 DIAGNOSIS — Z5111 Encounter for antineoplastic chemotherapy: Secondary | ICD-10-CM

## 2020-03-16 DIAGNOSIS — Z716 Tobacco abuse counseling: Secondary | ICD-10-CM

## 2020-03-16 DIAGNOSIS — Z79899 Other long term (current) drug therapy: Secondary | ICD-10-CM | POA: Diagnosis not present

## 2020-03-16 DIAGNOSIS — C329 Malignant neoplasm of larynx, unspecified: Secondary | ICD-10-CM | POA: Diagnosis present

## 2020-03-16 DIAGNOSIS — R634 Abnormal weight loss: Secondary | ICD-10-CM | POA: Diagnosis not present

## 2020-03-16 DIAGNOSIS — Z9889 Other specified postprocedural states: Secondary | ICD-10-CM | POA: Insufficient documentation

## 2020-03-16 DIAGNOSIS — R490 Dysphonia: Secondary | ICD-10-CM

## 2020-03-16 NOTE — Progress Notes (Signed)
Oncology Nurse Navigator Documentation  Met with patient during initial consult with Dr. Chryl Heck.  He was accompanied by his wife [name].  . Further introduced myself as his/their Navigator, explained my role as a member of the Care Team. . Provided New Patient Information packet: o Contact information for physician, this navigator, other members of the Care Team o Advance Directive information ([place] blue pamphlet with LCSW insert); provided [place] AD booklet at his request,  o Fall Prevention Patient [place] sheet o Symptom Management Clinic information o [facility] campus map with highlight of [place] o SLP Information sheet . Provided and discussed educational handouts for PEG and PAC. Marland Kitchen Assisted with post-consult appt scheduling. . They verbalized understanding of information provided. I encouraged them to call with questions/concerns moving forward and they know that I will see them when they meet with Dr. Isidore Moos on 03/23/20.  Harlow Asa, RN, BSN, OCN Head & Neck Oncology Nurse [place] [facility] [place] [phone]

## 2020-03-16 NOTE — Progress Notes (Signed)
[place] Psychosocial Distress Screening Clinical Social Work  Clinical Social Work was referred by distress screening protocol.  The patient scored a 7 on the Psychosocial Distress Thermometer which indicates moderate distress. Clinical Social Worker contacted patient by phone to assess for distress and other psychosocial needs.  CSW left patient a voicemail offering support and information on the support team.  CSW provided contact information and encouraged patient to return call.   ONCBCN DISTRESS SCREENING 03/16/2020  Screening Type Initial Screening  Distress experienced in past week (1-10) 7  Practical problem type Work/school  Physical Problem type Pain  Physician notified of physical symptoms Yes  Referral to clinical psychology No  Referral to clinical social work Yes    Johnnye Lana, MSW, LCSW, OSW-C Clinical Social Worker [place] [phone]

## 2020-03-16 NOTE — Progress Notes (Signed)
[facility] CONSULT NOTE  Patient Care Team: Patient, No Pcp Per as PCP - General (General Practice)  CHIEF COMPLAINTS/PURPOSE OF CONSULTATION:  Laryngeal cancer.  ASSESSMENT & PLAN:  No problem-specific Assessment & Plan notes found for this encounter.  No orders of the defined types were placed in this encounter.  1. SCC of larynx involving both VC, so atleast T2N1, staging incomplete at this time. I do not understand the relevance of p16 positivity in Laryngeal primary. We have discussed to complete staging with PET/CT.   If larynx preserving surgery can be attempted, then this is certainly an available option for early stage laryngeal cancer. If he needs to have total laryngectomy, then he can consider surgery vs ChemoRT. If he decides to proceed with surgery upfront, then he may still chemoRT vs adjuvant RT alone. He will meet Dr Isidore Moos, complete PET CT, then will come up with a plan.  2. Weight loss, unintentional likely secondary to malignancy as well as stress. I have recommended nutritional referral and consider G tube if he needs to undergo ChemoRT  3. Nicotine abuse, advised smoking cessation, We have offered assistance, he doesn't appear motivated at this time.  4. CAD S/P coronary angiography,  Recommended smoking cessation and FU with PCP for monitoring.   HISTORY OF PRESENTING ILLNESS:  Erik [name] 55 y.o. male is here because of new diagnosis of laryngeal cancer  Chronology  Pt had noticed ongoing hoarseness for almost 6 months, and was seen by a doctor who suggested that it may be related to GERD He used medication for GERD but his hoarseness continued to worsen. He then started getting SOB and went to the ER. He was found to have airway obstruction and underwent elective tracheostomy and direct laryngoscopy with biopsy.  Pertinent imaging and pathology results so far.  03/04/2020  Mucosal irregularity and mucosal/submucosal edema of  the supraglottic and glottic larynx. Primary differential considerations include infectious/inflammatory laryngeal edema versus laryngeal malignancy. ENT consultation and direct visualization recommended. Associated mild-to-moderate narrowing, and mild rightward deviation, of the airway at this level.  Additional edema within the cutaneous/subcutaneous ventral neck.  Mildly enlarged left level 2/3 lymph node which may be reactive or could reflect metastatic nodal disease.  Nonspecific 1.4 cm part cystic/part solid cutaneous/subcutaneous lesion within the midline upper neck. Direct visualization recommended.  03/05/2020  FINAL MICROSCOPIC DIAGNOSIS:   A. LEFT VOCAL CORD, EXCISION:  - Invasive moderately differentiated squamous cell carcinoma, see  comment   B. RIGHT VOCAL CORD, EXCISION:  - Invasive moderately differentiated squamous cell carcinoma, see  comment   C. LEFT FALSE VOCAL CORD, EXCISION:  - Invasive moderately differentiated squamous cell carcinoma, see  comment   P 16 positive  He is here to establish with oncology. He complains of ongoing hoarseness. No difficulty swallowing.  He still lost about 25 pounds of weight, he attributes this to increased physical activity. No hearing issues.  No change in breathing.  He does not have an established diagnosis of COPD.   He is a smoker, has been smoking 1 pack/day for 35+ years. Past medical history also significant for coronary artery disease and he needed 2 stents placed back in 2017. He is otherwise healthy at baseline. Rest of the pertinent 10 point ROS reviewed and negative.  REVIEW OF SYSTEMS:   Constitutional: Denies fevers, chills or abnormal night sweats Eyes: Denies blurriness of vision, double vision or watery eyes Ears, nose, mouth, throat, and face: As mentioned above respiratory: Denies cough,  dyspnea or wheezes Cardiovascular: Denies palpitation, chest discomfort or lower extremity  swelling Gastrointestinal:  Denies nausea, heartburn or change in bowel habits Skin: Denies abnormal skin rashes Lymphatics: Denies new lymphadenopathy or easy bruising Neurological:Denies numbness, tingling or new weaknesses Behavioral/Psych: Mood is stable, no new changes  All other systems were reviewed with the patient and are negative.  MEDICAL HISTORY:  Past Medical History:  Diagnosis Date  . Coronary artery disease   . Dyspnea   . GERD (gastroesophageal reflux disease)    Phreesia 02/21/2020  . Ruptured lumbar intervertebral disc     SURGICAL HISTORY: Past Surgical History:  Procedure Laterality Date  . CARDIAC CATHETERIZATION     with stent placement  . TONSILLECTOMY    . TRACHEOSTOMY TUBE PLACEMENT N/A 03/05/2020   Procedure: AWAKE TRACHEOSTOMY, DIRECT LARYNGOSCOPY WITH BIOPSY;  Surgeon: Melida Quitter, MD;  Location: [facility];  Service: ENT;  Laterality: N/A;    SOCIAL HISTORY: Social History   Socioeconomic History  . Marital status: Married    Spouse name: Not on file  . Number of children: Not on file  . Years of education: Not on file  . Highest education level: Not on file  Occupational History  . Not on file  Tobacco Use  . Smoking status: Current Every Day Smoker    Types: Cigarettes  . Smokeless tobacco: Former Systems developer    Types: Secondary school teacher  . Vaping Use: Former  Substance and Sexual Activity  . Alcohol use: No  . Drug use: No  . Sexual activity: Yes  Other Topics Concern  . Not on file  Social History Narrative  . Not on file   Social Determinants of Health   Financial Resource Strain: Not on file  Food Insecurity: Not on file  Transportation Needs: Not on file  Physical Activity: Not on file  Stress: Not on file  Social Connections: Not on file  Intimate Partner Violence: Not on file    FAMILY HISTORY: History reviewed. No pertinent family history.  ALLERGIES:  has No Known Allergies.  MEDICATIONS:  Current Outpatient  Medications  Medication Sig Dispense Refill  . albuterol (VENTOLIN HFA) 108 (90 Base) MCG/ACT inhaler Inhale 2 puffs into the lungs every 6 (six) hours as needed for wheezing or shortness of breath. 8 g 0  . benzonatate ([name]) 100 MG capsule Take 1 capsule (100 mg total) by mouth 3 (three) times daily as needed for cough. 20 capsule 0  . cholecalciferol (VITAMIN D3) 25 MCG (1000 UNIT) tablet Take 1,000 Units by mouth daily.    Marland Kitchen ibuprofen (ADVIL,MOTRIN) 200 MG tablet Take 800 mg by mouth every 8 (eight) hours as needed for mild pain. For pain    . Multiple Vitamin (MULTIVITAMIN WITH MINERALS) TABS tablet Take 1 tablet by mouth daily.    Marland Kitchen omeprazole (PRILOSEC) 20 MG capsule Take 1 capsule (20 mg total) by mouth 2 (two) times daily before a meal. (Patient taking differently: Take 20 mg by mouth daily.) 60 capsule 1   No current facility-administered medications for this visit.     PHYSICAL EXAMINATION: ECOG PERFORMANCE STATUS: 0 - Asymptomatic  Vitals:   03/16/20 1310  BP: 134/89  Pulse: 72  Resp: 16  Temp: 99.2 F (37.3 C)  SpO2: 100%   Filed Weights   03/16/20 1310  Weight: 122 lb 3.2 oz (55.4 kg)    GENERAL:alert, no distress and comfortable SKIN: skin color, texture, turgor are normal, no rashes or significant lesions EYES:  normal, conjunctiva are pink and non-injected, sclera clear OROPHARYNX: buccal mucosa, and tongue normal  NECK:  Tracheostomy in place LYMPH:  no palpable lymphadenopathy in the cervical, axillary  LUNGS: clear to auscultation and percussion with normal breathing effort HEART: regular rate & rhythm and no murmurs and no lower extremity edema ABDOMEN:abdomen soft, non-tender and normal bowel sounds Musculoskeletal:no cyanosis of digits and no clubbing  PSYCH: alert & oriented x 3 with fluent speech NEURO: no focal motor/sensory deficits  LABORATORY DATA:  I have reviewed the data as listed Lab Results  Component Value Date   WBC 8.4  03/04/2020   HGB 14.0 03/04/2020   HCT 42.5 03/04/2020   MCV 92.8 03/04/2020   PLT 353 03/04/2020     Chemistry      Component Value Date/Time   NA 141 03/04/2020 1219   K 3.6 03/04/2020 1219   CL 102 03/04/2020 1219   CO2 29 03/04/2020 1219   BUN 21 (H) 03/04/2020 1219   CREATININE 0.83 03/04/2020 1219      Component Value Date/Time   CALCIUM 9.6 03/04/2020 1219   ALKPHOS 65 11/25/2019 0846   AST 22 11/25/2019 0846   ALT 25 11/25/2019 0846   BILITOT 0.6 11/25/2019 0846       RADIOGRAPHIC STUDIES: I have personally reviewed the radiological images as listed and agreed with the findings in the report. DG Chest 2 View  Result Date: 03/04/2020 CLINICAL DATA:  COPD exacerbation EXAM: CHEST - 2 VIEW COMPARISON:  11/25/2019 FINDINGS: The heart size and mediastinal contours are within normal limits. Atherosclerotic calcification of the aortic knob. Mildly hyperexpanded lungs. No focal airspace consolidation, pleural effusion, or pneumothorax. The visualized skeletal structures are unremarkable. IMPRESSION: No active cardiopulmonary disease. Electronically Signed   By: Davina Poke D.O.   On: 03/04/2020 15:58   CT Soft Tissue Neck W Contrast  Result Date: 03/04/2020 CLINICAL DATA:  Soft tissue mass, neck, no prior imaging; smoker, hoarseness different than past, external neck swelling, patient reports sensation of internal edema. Additional history provided: Patient reports sore throat and shortness of breath since last Thursday, throat swelling. EXAM: CT NECK WITH CONTRAST TECHNIQUE: Multidetector CT imaging of the neck was performed using the standard protocol following the bolus administration of intravenous contrast. CONTRAST:  3mL OMNIPAQUE IOHEXOL 300 MG/ML  SOLN COMPARISON:  Neck CT 09/29/2005. FINDINGS: Mildly motion degraded examination. Pharynx and larynx: Streak artifact from dental restoration partially obscures the oral cavity. No appreciable swelling or discrete mass within  the oral cavity or pharynx. There is mucosal irregularity as well as mucosal/submucosal edema of the supraglottic and glottic larynx. Resultant mild-to-moderate narrowing and mild rightward deviation of the airway at this level. There is additional edema within the ventral neck. Salivary glands: No inflammation, mass, or stone. Thyroid: Unremarkable. Lymph nodes: Enlarged left level 2/3 lymph node measuring 14 mm in short axis (series 6, image 62) Vascular: The major vascular structures of the neck are patent. Aortic atherosclerosis. Minimal atherosclerotic plaque within the left carotid bifurcation and proximal ICA. Limited intracranial: No acute intracranial abnormality identified. Visualized orbits: Excluded from the field of view. Mastoids and visualized paranasal sinuses: Trace ethmoid sinus mucosal thickening at the imaged levels. No significant mastoid effusion. Skeleton: No acute bony abnormality or aggressive osseous lesion. Upper chest: No consolidation within the imaged lung apices. Centrilobular emphysema. Other: Nonspecific 1.4 cm part cystic/part solid cutaneous/subcutaneous lesion within the midline upper neck (series 2, image 42). These results were called by telephone at the time  of interpretation on 03/04/2020 at 2:48 pm to provider PA Sherol Dade , who verbally acknowledged these results. IMPRESSION: Mucosal irregularity and mucosal/submucosal edema of the supraglottic and glottic larynx. Primary differential considerations include infectious/inflammatory laryngeal edema versus laryngeal malignancy. ENT consultation and direct visualization recommended. Associated mild-to-moderate narrowing, and mild rightward deviation, of the airway at this level. Additional edema within the cutaneous/subcutaneous ventral neck. Mildly enlarged left level 2/3 lymph node which may be reactive or could reflect metastatic nodal disease. Nonspecific 1.4 cm part cystic/part solid cutaneous/subcutaneous lesion  within the midline upper neck. Direct visualization recommended. Emphysema (ICD10-J43.9). Aortic Atherosclerosis (ICD10-I70.0). Electronically Signed   By: Kellie Simmering DO   On: 03/04/2020 14:48    All questions were answered. The patient knows to call the clinic with any problems, questions or concerns. I spent 60 minutes in the care of this patient including H and P, review of records, counseling and coordination of care.     Benay Pike, MD 03/16/2020 1:23 PM

## 2020-03-17 ENCOUNTER — Other Ambulatory Visit: Payer: Self-pay | Admitting: Hematology and Oncology

## 2020-03-17 ENCOUNTER — Encounter: Payer: Self-pay | Admitting: General Practice

## 2020-03-17 DIAGNOSIS — C329 Malignant neoplasm of larynx, unspecified: Secondary | ICD-10-CM

## 2020-03-17 NOTE — Progress Notes (Signed)
[facility] Work  Initial Assessment   Erik [name] is a 55 y.o. year old male contacted by phone. Clinical Social Work was referred by Distress Screen for assessment of psychosocial needs.   SDOH (Social Determinants of Health) assessments performed: Yes   Distress Screen completed: Yes ONCBCN DISTRESS SCREENING 03/16/2020  Screening Type Initial Screening  Distress experienced in past week (1-10) 7  Practical problem type Work/school  Physical Problem type Pain  Physician notified of physical symptoms Yes  Referral to clinical psychology No  Referral to clinical social work Yes      Family/Social Information:  . Housing Arrangement: patient lives with wife, daughter, 3 grandchildren (34, 3 and 6 months) . Family members/support persons in your life? Wife can help him if needed . Transportation concerns: None  . Employment: Out on work excuse. Income source: limited work hours . Financial concerns: Yes, due to illness and/or loss of work during treatment o Type of concern: Is working a small amount, will try to return to work 2/17 on limited basis.  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  He is the source of income for the family.  Discussed options of [employer] and/or [employer] - states that finances are not a concern at this time but may become one if/when he is totally unable to work.  He intends to return to work full time when able.   . Food access concerns: None at this time . Religious or spiritual practice: church . Medication Concerns: none . Services Currently in place:  None   Coping/ Adjustment to diagnosis: . Patient understands treatment plan and what happens next? Newly diagnosed with laryngeal cancer, has many unknowns re treatment at this point.  He has met w medical oncologist and discussed various options - will  meet w radiation oncologist next week and have further scans for staging.  Still significantly hoarse, knows that he may lose his ability to speak naturally due to his cancer.  Diagnosed while inpatient, has had prolonged period of hoarseness prior to diagnosis.   . Concerns about diagnosis and/or treatment: possibility of losing voice, continuing ability to work . Patient reported stressors: Work/ school and complete picture of "what I am dealing with" . Hopes and priorities: hopes to preserve ability to speak . 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 Current coping skills/ strengths: Religious Affiliation, Special hobby/interest and Work skills    SUMMARY: Current SDOH Barriers:  . none at this time  Interventions: . Discussed common feeling and emotions when being diagnosed with cancer, and the importance of support during treatment . Informed patient of the support team roles and support services at [facility] . Provided CSW contact information and encouraged patient to call with any questions or concerns . Provided patient with information about [facility] and how to access social work services if needed   Follow Up Plan: Patient will contact CSW with any support or resource needs Patient verbalizes understanding of plan: Yes    Beverely Pace , [place], [place] Worker Phone:  [phone]

## 2020-03-18 ENCOUNTER — Other Ambulatory Visit: Payer: Self-pay

## 2020-03-18 ENCOUNTER — Ambulatory Visit (HOSPITAL_COMMUNITY): Payer: Self-pay | Admitting: Dentistry

## 2020-03-18 VITALS — BP 155/91 | HR 94 | Temp 98.3°F

## 2020-03-18 DIAGNOSIS — K029 Dental caries, unspecified: Secondary | ICD-10-CM

## 2020-03-18 DIAGNOSIS — K036 Deposits [accretions] on teeth: Secondary | ICD-10-CM

## 2020-03-18 DIAGNOSIS — K0889 Other specified disorders of teeth and supporting structures: Secondary | ICD-10-CM

## 2020-03-18 DIAGNOSIS — K0530 Chronic periodontitis, unspecified: Secondary | ICD-10-CM

## 2020-03-18 DIAGNOSIS — Z01818 Encounter for other preprocedural examination: Secondary | ICD-10-CM

## 2020-03-18 DIAGNOSIS — K011 Impacted teeth: Secondary | ICD-10-CM

## 2020-03-18 DIAGNOSIS — C329 Malignant neoplasm of larynx, unspecified: Secondary | ICD-10-CM | POA: Diagnosis not present

## 2020-03-18 DIAGNOSIS — M264 Malocclusion, unspecified: Secondary | ICD-10-CM

## 2020-03-18 DIAGNOSIS — K08109 Complete loss of teeth, unspecified cause, unspecified class: Secondary | ICD-10-CM

## 2020-03-18 DIAGNOSIS — K0850 Unsatisfactory restoration of tooth, unspecified: Secondary | ICD-10-CM

## 2020-03-18 DIAGNOSIS — K030 Excessive attrition of teeth: Secondary | ICD-10-CM

## 2020-03-18 NOTE — Patient Instructions (Signed)
[facility] Department of Dental Medicine Dr. Craigory Toste B. Tyrian Peart Phone: [phone] Fax: [phone]   It was a pleasure seeing you today!  Please refer to the information below regarding your dental visit with us, and call us should you have any questions or concerns that may come up after you leave.   Thank you for giving us the opportunity to provide care for you.  If there is anything we can do for you, please let us know.     RADIATION THERAPY AND INFORMATION REGARDING YOUR TEETH   [x]Xerostomia (Dry Mouth) Your salivary glands may be in the field of radiation.  Radiation may include all or only part of your salivary glands.  This will cause your saliva to dry up, and you will have a dry mouth.  The dry mouth will be for the rest of your life unless your radiation oncologist tells you otherwise.  Your saliva has many functions:  It wets your tongue for speaking.  It coats your teeth and the inside of your mouth for easier movement.  It helps with chewing and swallowing food.  It helps clean away harmful acid and toxic products made by the germs in your mouth, therefore it helps prevent cavities.  It kills some germs in your mouth and helps to prevent gum disease.  It helps to carry flavor to your taste buds.  Once you have lost your saliva you will be at higher risk for tooth decay and gum disease.    What can be done to help improve your mouth when there's not enough saliva: . Your dentist may give a prescription for Salagen.  It will not bring back all of your saliva but may bring back some of it.  Also, your saliva may be thick and ropy or white and foamy. It will not feel like it use to feel. . You will need to swish with water every time your mouth feels dry.  YOU CANNOT suck on any cough drops, mints, lemon drops, candy, vitamin C or any other products.  You cannot use anything other than water to make your mouth feel less dry.  If you want to drink anything else,  you have to drink it all at once and brush afterwards.  Be sure to discuss the details of your diet habits with your dentist or hygienist.  [x]Radiation caries: . This is decay (cavities) that happens very quickly once your mouth is very dry due to radiation therapy.  Normally, cavities take six months to two years to become a problem.  When you have dry mouth, cavities may take as little as eight weeks to cause you a problem.   . Dental check-ups every two months are necessary as long as you have a dry mouth. Radiation caries typically, but not always, start at your gum line where it is hard to see the cavity.  It is therefore also hard to fill these cavities adequately.  This high rate of cavities happens because your mouth no longer has saliva and therefore the acid made by the germs starts the decay process.  Whenever you eat anything the germs in your mouth change the food into acid.  The acid then burns a small hole in your tooth.  This small hole is the beginning of a cavity.  If this is not treated then it will grow bigger and become a cavity.  The way to avoid this hole getting bigger is to use fluoride every evening as prescribed by your   dentist following your radiation.   NOTE:  You have to make sure that your teeth are very clean before you use the fluoride.  This fluoride in turn will strengthen your teeth and prepare them for another day of fighting acid.  If you develop radiation caries many times, the damage is so large that you will have to have all your teeth removed.  This could be a big problem if some of these teeth are in the field of radiation.  Further details of why this could be a big problem will follow.  (See Osteoradionecrosis).  [x]Dysgeusia (Loss of Taste) This happens to varying degrees once you've had radiation therapy to your jaw region.  Many times taste is not completely lost, but becomes limited.  The loss of taste is mostly due to radiation affecting your taste buds.   However, if you have no saliva in your mouth to carry the flavor to your taste buds, it would be difficult for your taste buds to taste anything.  That is why using water or a prescription for Salagen prior to meals and during meal times may help with some of the taste.  Keep in mind that taste generally returns very slowly over the course of several months or several years after radiation therapy.  Don't give up hope.  [x]Trismus (Limited Jaw Opening) According to your Radiation Oncologist, your TMJ or jaw joints are going to be partially or fully in the field of radiation.  This means that over time the muscles that help you open and close your mouth may get stiff.  This will potentially result in your not being able to open your mouth wide enough or as wide as you can open it now.    Let me give you an example of how slowly this happens and how unaware people are of it:   A gentlemen that had radiation therapy two years ago came back to me complaining that bananas are just too large for him to be able to fit them in between his teeth.  He was not able to open wide enough to bite into a banana.  This happens slowly and over a period of time.  What we do to try and prevent this:   . Your dentist will probably give you a stack of sticks called a trismus exercise device.  This stack will help remind your muscles and your jaw joints to open up to the same distance every day.  Use these sticks every morning when you wake up, or according to the instructions given by your dentist.    . You must use these sticks for at least one to two years after radiation therapy.  The reason for that is because it happens so slowly and keeps going on for about two years after radiation therapy.  Your hospital dentist will help you monitor your mouth opening and make sure that it's not getting smaller after radiation.  [x]Osteoradionecrosis (ORN) . This is a condition where your jaw bone after radiation therapy becomes  very dry.  It has very little blood supply to keep it alive.  If you develop a cavity that turns into an abscess or an infection, then the jaw bone does not have enough blood supply to help fight the infection.  At this point it is very likely that the infection could cause the death of your jaw bone.  When you have dead bone it has to be removed.  Therefore, you might end up having   to have surgery to remove part of your jaw bone, the part of the jaw bone that has been affected.    . Healing is also a problem if you are to have surgery (like a tooth extraction) in the areas where the bone has had radiation therapy.  If you have surgery, you need more blood supply to heal which is not available.  When blood supply and oxygen are not available, there is a chance for the bone to die. . Occasionally, ORN happens on its own with no obvious reason, but this is quite rare.  We believe that patients who continue to smoke and/or drink alcohol have a higher chance of having this problem. . Once your jaw bone has had radiation therapy, if there are any remaining teeth in that area, it is not recommended to have them pulled unless your dentist or oral surgeon is aware of your history of radiation and believes it is safe.  . The risks for ORN either from infection or spontaneously occurring (with no reason) are life long. 

## 2020-03-18 NOTE — Progress Notes (Signed)
DENTAL VISIT OUTPATIENT CONSULTATION  Service Date:   03/18/2020 Referring Provider:                  Eppie Gibson, MD  Patient Name:   Erik [name] Date of Birth:   [birth year] Medical Record Number: [id number]    PLAN & RECOMMENDATIONS   >> There are no current signs of acute dental infection including abscess, edema or erythema, or suspicious lesion requiring biopsy.  The patient does have severe periodontal disease and several cavities that need to be addressed as soon as he completes radiation therapy to decrease his risk of postoperative complications or infection. >> No SPDs were fabricated due to minimal occlusal amalgam restorations likely not going to be in the field of radiation, and the extent of bone loss + heavy calculus build-up on his teeth.  >> We plan to follow-up with the patient as soon as he completes radiotherapy in the [facility].     >> Recommend the patient establish care at a dental office of his choice for routine dental care including replacement of missing teeth and exams. He will need to see a periodontist to manage his gum disease due to the severity and to restore his gingival health with scaling and root planing + periodontal maintenance. >> Discussed in detail all treatment options with the patient and he is agreeable to the plan.  Thank you for consulting with [facility] and for the opportunity to participate in this patient's treatment.  Should you have any questions or concerns, please contact the [facility] at [phone].    Consult Note:  03/18/2020    COVID 19 SCREENING: The patient denies symptoms concerning for COVID-19 infection including fever, chills, cough, or newly developed shortness of breath.  HPI: Erik [name] is a very pleasant 55 y.o. male with h/o CAD, GERD and (+) tobacco use (smoker) who was recently diagnosed with laryngeal cancer.  Patient presents today for a dental evaluation as part of their  Pre-Radiation of the head and neck work-up.   Dental History: Patient reports that it has been a long time since he has last seen a dentist.  He thinks the last time he went was not to dentist, but to an oral surgeon and this was for an emergency extraction of an abscessed tooth.  He reported that he also had an extraoral drain placed due to submandibular or submental space infection.  He currently denies any dental or oral pain or sensitivity. Patient able to manage oral secretions.  Patient denies fever, rigors and malaise.  CHIEF COMPLAINT: Here for a dental exam before starting radiation therapy.   Patient Active Problem List   Diagnosis Date Noted  . Malnutrition of moderate degree 03/05/2020  . Laryngeal cancer ([place]) 03/04/2020   Past Medical History:  Diagnosis Date  . Coronary artery disease   . Dyspnea   . GERD (gastroesophageal reflux disease)    [employer] 02/21/2020  . Ruptured lumbar intervertebral disc    Past Surgical History:  Procedure Laterality Date  . CARDIAC CATHETERIZATION     with stent placement  . TONSILLECTOMY    . TRACHEOSTOMY TUBE PLACEMENT N/A 03/05/2020   Procedure: AWAKE TRACHEOSTOMY, DIRECT LARYNGOSCOPY WITH BIOPSY;  Surgeon: Melida Quitter, MD;  Location: [facility];  Service: ENT;  Laterality: N/A;   No Known Allergies Current Outpatient Medications  Medication Sig Dispense Refill  . albuterol (VENTOLIN HFA) 108 (90 Base) MCG/ACT inhaler Inhale 2 puffs into the lungs every 6 (six)  hours as needed for wheezing or shortness of breath. 8 g 0  . benzonatate ([name]) 100 MG capsule Take 1 capsule (100 mg total) by mouth 3 (three) times daily as needed for cough. 20 capsule 0  . cholecalciferol (VITAMIN D3) 25 MCG (1000 UNIT) tablet Take 1,000 Units by mouth daily.    Marland Kitchen ibuprofen (ADVIL,MOTRIN) 200 MG tablet Take 800 mg by mouth every 8 (eight) hours as needed for mild pain. For pain    . Multiple Vitamin (MULTIVITAMIN WITH MINERALS) TABS tablet Take 1  tablet by mouth daily.    Marland Kitchen omeprazole (PRILOSEC) 20 MG capsule Take 1 capsule (20 mg total) by mouth 2 (two) times daily before a meal. (Patient taking differently: Take 20 mg by mouth daily.) 60 capsule 1   No current facility-administered medications for this visit.    LABS: Lab Results  Component Value Date   WBC 8.4 03/04/2020   HGB 14.0 03/04/2020   HCT 42.5 03/04/2020   MCV 92.8 03/04/2020   PLT 353 03/04/2020      Component Value Date/Time   NA 141 03/04/2020 1219   K 3.6 03/04/2020 1219   CL 102 03/04/2020 1219   CO2 29 03/04/2020 1219   GLUCOSE 104 (H) 03/04/2020 1219   BUN 21 (H) 03/04/2020 1219   CREATININE 0.83 03/04/2020 1219   CALCIUM 9.6 03/04/2020 1219   GFRNONAA >60 03/04/2020 1219   No results found for: INR, PROTIME No results found for: PTT  Social History   Socioeconomic History  . Marital status: Married    Spouse name: Not on file  . Number of children: Not on file  . Years of education: Not on file  . Highest education level: Not on file  Occupational History  . Not on file  Tobacco Use  . Smoking status: Current Every Day Smoker    Types: Cigarettes  . Smokeless tobacco: Former Systems developer    Types: Secondary school teacher  . Vaping Use: Former  Substance and Sexual Activity  . Alcohol use: No  . Drug use: No  . Sexual activity: Yes  Other Topics Concern  . Not on file  Social History Narrative  . Not on file   Social Determinants of Health   Financial Resource Strain: Not on file  Food Insecurity: Not on file  Transportation Needs: No Transportation Needs  . Lack of Transportation (Medical): No  . Lack of Transportation (Non-Medical): No  Physical Activity: Not on file  Stress: Not on file  Social Connections: Not on file  Intimate Partner Violence: Not on file   No family history on file.   REVIEW OF SYSTEMS: Reviewed with the patient as per HPI. PSYCH: Patient denies having dental phobia.   VITAL SIGNS: BP (!) 155/91 (BP  Location: Right Arm)   Pulse 94   Temp 98.3 F (36.8 C)    PHYSICAL EXAMINATION: GENERAL: Well-developed, comfortable and in no apparent distress. (+) Tracheostomy and has frequent coughing/gagging spells. NEUROLOGICAL: Alert and oriented to person, place and time. EXTRAORAL:  Facial symmetry present without any edema or erythema.  No swelling or lymphadenopathy. TMJ asymptomatic without clicks or crepitations.   Maximum interincisal opening = 45 mm INTRAORAL: Soft tissues appear well-perfused and mucous membranes moist.  FOM and vestibules soft and not raised. Oral cavity without mass or lesion. No signs of infection, parulis, sinus tract, edema or erythema evident upon exam.  DENTAL EXAMINATION: DENTITION: Overall poor remaining dentition.  Missing teeth, caries, existing restorations.  Attrition on incisal edges of teeth numbers 7-10 and 24-27. Generalized heavy staining on teeth. #32 unerupted tooth. #18 drifting mesially. The patient is maintaining poor oral hygiene. PERIODONTAL: Inflamed, erythematous gingival tissue.  Heavy calculus accumulation on mandibular anterior teeth and heavy to moderate interproximal accumulation that is generalized. Teeth numbers 24, 25 and 26 Class I mobility with severe bone loss being stabilized by calculus bridge. DENTAL CARIES/DEFECTIVE RESTORATIONS: Clinical caries charted. #18 large decay. Multiple teeth with interproximal root caries. #30 existing occlusal amalgam with chip. PROSTHODONTIC: Patient denies wearing partial dentures. OCCLUSION: Posterior crossbite, mandible shift to the left with ~2-3 mm of width b/w occlusal surfaces of maxillary molars and occlusal surfaces of mandibular molars on the right side. Tooth #9 supra-erupted and shifted out of occlusal plane.  RADIOGRAPHIC EXAMINATION: PAN Full Mouth Series exposed and interpreted: >> Condyles seated bilaterally in fossas.  No evidence of abnormal pathology.  All visualized osseous structures  appear WNL. >> Generalized moderate horizontal bone loss with areas of localized severe consistent with localized severe periodontitis. Significant radiographic calculus accumulation evident. Tooth number 24 has severe distal horizontal bone loss. Missing teeth, caries, existing restorations, impacted tooth #32. Multiple teeth with small interproximal root caries. #18 large DO decay/broken existing restoration.    ASSESSMENT 1. Laryngeal cancer 2. Pre-radiation Dental consultation 3. Missing teeth 4. Caries 5. Impaction 6. Chronic periodontitis 7. Accretions on teeth 8. Defective dental restoration 9. Attrition 10. Loose teeth 11. Malocclusion   PLAN/RECOMMENDATIONS  . I discussed the risks, benefits, and complications of various treatment options with the patient in relationship to his cancer diagnosis with anticipated radiotherapy and dental conditions.  We discussed the common side effects of head and neck radiation to include radiation caries, dry mouth, limited opening, loss of taste and osteoradionecrosis.  We discussed the findings of his dental exam and severity of his gum disease likely exacerbated by smoking and not seeing a dentist regularly.  I explained that after radiation, all of the smaller cavities he has are going to grow at a much quicker rate, and in order to be able to brush adequately, floss and use fluoride treatments it will be imperative that we find a dentist or periodontist for him to establish care with after radiotherapy to make sure he is maintaining optimal oral health and decrease the risk of postoperative complications such as ORN.  Since he will likely not receive radiation to any of his teeth, I do not feel it is necessary to address this or extract any teeth prior to initiating radiation, but he does have severe periodontal disease and several cavities that could become acutely infected in the future if he does not address his oral hygiene and health.  He  verbalized understanding of discussion and findings. . We discussed various treatment options to include no treatment, multiple extractions with alveoloplasty, pre-prosthetic surgery as indicated, periodontal therapy, dental restorations, and replacement of missing teeth as indicated. The patient verbalized understanding of all options, and currently wishes to proceed with no treatment at this time and to prioritize finding a dental provider following radiation to address all of his oral health issues and concerns.   Trismus device was fabricated today.  Leta Speller, DAII provided verbal and written instructions on how to use the device and demonstrated for the patient.  . Discussion of findings with medical team and coordination of future medical and dental care as needed. . Plan to schedule a follow-up visit with the patient once he has completed radiation therapy  in the [facility].   The patient tolerated today's visit well.  All questions and concerns were addressed and the patient departed in stable condition.   I spent in excess of 120 minutes during the conduct of this consultation and >50% of this time involved direct face-to-face encounter for counseling and/or coordination of the patient's care. [place], DMD

## 2020-03-22 NOTE — Progress Notes (Signed)
Radiation Oncology         [phone] ________________________________  Initial Outpatient Consultation  Name: Erik [name] MRN: [id number]  Date: 03/23/2020  DOB: 21-Sep-1965  [facility], No Pcp Per  Melida Quitter, MD   REFERRING PHYSICIAN: Melida Quitter, MD  DIAGNOSIS:    ICD-10-CM   1. Malignant neoplasm of supraglottis [facility])  C32.1    Cancer Staging Malignant neoplasm of supraglottis [facility]) Staging form: Larynx - Supraglottis, AJCC 8th Edition - Clinical stage from 03/23/2020: cT3, cN1 - Signed by Eppie Gibson, MD on 03/26/2020 Stage prefix: Initial diagnosis   CHIEF COMPLAINT: Here to discuss management of throat cancer  HISTORY OF PRESENT ILLNESS::Erik [name] is a 55 y.o. male who presented six month history of with progressively worsening sore throat, hoarseness, and throat swelling.  He was ultimately admitted to the hospital and a CT scan revealed on 03/04/2020 mucosal irregularity and mucosal/submucosal edema of the supraglottic and glottic larynx. Primary differential considerations included infectious/inflammatory laryngeal edema versus laryngeal malignancy. There was also noted to be associated mild-to-moderate narrowing, and mild rightward deviation, of the airway at that level. There was additional edema within the cutaneous/subcutaneous ventral neck. Additionally, there was a mildly enlarged left level 2/3 lymph node, which may have reactive or could have reflected metastatic nodal disease. Finally, there was a non-specific 1.4 cm part cystic/part solid cutaneous/subcutaneous lesion within the midline upper neck.  He was referred to otolaryngology.    Subsequently, the patient saw Dr. Redmond Baseman, who performed Fiberoptic laryngoscopic exam at the bedside demonstrating irregular tissue of the left glottis with immobile left hemilarynx and polypoid changes of the right vocal fold.  The glottic airway is narrowed markedly.  Dr Redmond Baseman then performed an awake tracheostomy and  direct laryngoscopy with biopsy on the date of 03/05/2020. 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 p16(+)    Nutrition Status Yes No Comments  Weight changes? _0  _1  Reports ~15-20lb unintentional weight loss since hospitalization   Swallowing concerns? _2  _3  Reports coughing frequently when drinking thin liquids (especailly if they're very cold or very hot). Sometimes feels like what he's swallowing is regurgitating up into his trach  PEG? _4  _5     Referrals Yes No Comments  Social Work? _6  _7  Spoke with Kelli Churn on 03/17/2020  Dentistry? _8  _9  Saw Dr. Sandi Mariscal on 03/18/2020  Swallowing therapy? _10  _11    Nutrition? _12  _13    Med/Onc? _14  _15  Met with Dr. Arletha Pili Iruku on 03/16/2020   Safety Issues Yes No Comments  Prior radiation? _16  _17    Pacemaker/ICD? _18  _19    Possible current pregnancy? _20  _21  N/A  Is the patient on methotrexate? _22  _23     Tobacco/Marijuana/Snuff/ETOH use: Currently smoking cigarettes less than 1 pack/day (trying to quit). He has quit using smokeless tobacco (chew). He reports that he does not drink alcohol and does not use drugs.  Recommendations by otolaryngology, if any:  03/16/2020 Dr. Melida Quitter Impression & Plans:  - We discussed his cancer diagnosis. He is scheduled for Heme/onc and Rad/onc consultations and a PET scan. We again discussed options for management including laryngectomy with likely post-op radiation versus combined chemotherapy and radiation therapy. Some pros and cons of each approach were detailed. He will call me back after meeting with the oncologists. - We will order a back-up tracheostomy for him through home health. I encouraged good nutrition and calorie intake and gave him pointers on safe swallowing including  chin-tuck and filling the lungs with air before swallowing.  Past/Anticipated interventions by  medical oncology, if any:  Under care of D.r Praveena Iruku 03/16/2020 1. SCC of larynx involving both VC, so atleast T2N1, staging incomplete at this time. --I do not understand the relevance of p16 positivity in Laryngeal primary. --We have discussed to complete staging with PET/CT.   --If larynx preserving surgery can be attempted, then this is certainly an available option for early stage laryngeal cancer. --If he needs to have total laryngectomy, then he can consider surgery vs ChemoRT. --If he decides to proceed with surgery upfront, then he may still chemoRT vs adjuvant RT alone. --He will meet Dr Isidore Moos, complete PET CT, then will come up with a plan. 2. Weight loss, unintentional likely secondary to malignancy as well as stress. --I have recommended nutritional referral and consider G tube if he needs to undergo ChemoRT 3. Nicotine abuse, advised smoking cessation, We have offered assistance, he doesn't appear motivated at this time. 4. CAD S/P coronary angiography,  --Recommended smoking cessation and FU with PCP for monitoring.  Current Complaints / other details:   PET scan ordered 04/05/2020   PREVIOUS RADIATION THERAPY: No  PAST MEDICAL HISTORY:  has a past medical history of Coronary artery disease, Dyspnea, GERD (gastroesophageal reflux disease), and Ruptured lumbar intervertebral disc.    PAST SURGICAL HISTORY: Past Surgical History:  Procedure Laterality Date  . CARDIAC CATHETERIZATION     with stent placement  . TONSILLECTOMY    . TRACHEOSTOMY TUBE PLACEMENT N/A 03/05/2020   Procedure: AWAKE TRACHEOSTOMY, DIRECT LARYNGOSCOPY WITH BIOPSY;  Surgeon: Melida Quitter, MD;  Location: [facility];  Service: ENT;  Laterality: N/A;    FAMILY HISTORY: family history is not on file.  SOCIAL HISTORY:  reports that he has been smoking cigarettes. He has quit using smokeless tobacco.  His smokeless tobacco use included chew. He reports that he does not drink alcohol and does not use  drugs.  ALLERGIES: Patient has no known allergies.  MEDICATIONS:  Current Outpatient Medications  Medication Sig Dispense Refill  . albuterol (VENTOLIN HFA) 108 (90 Base) MCG/ACT inhaler Inhale 2 puffs into the lungs every 6 (six) hours as needed for wheezing or shortness of breath. 8 g 0  . benzonatate ([name]) 100 MG capsule Take 1 capsule (100 mg total) by mouth 3 (three) times daily as needed for cough. 20 capsule 0  . cholecalciferol (VITAMIN D3) 25 MCG (1000 UNIT) tablet Take 1,000 Units by mouth daily.    Marland Kitchen ibuprofen (ADVIL,MOTRIN) 200 MG tablet Take 800 mg by mouth every 8 (eight) hours as needed for mild pain. For pain    . Multiple Vitamin (MULTIVITAMIN WITH MINERALS) TABS tablet Take 1 tablet by mouth daily.    Marland Kitchen omeprazole (PRILOSEC) 20 MG capsule Take 1 capsule (20 mg total) by mouth 2 (two) times daily before a meal. (Patient taking differently: Take 20 mg by mouth daily.) 60 capsule 1   No current facility-administered medications for this encounter.    REVIEW OF SYSTEMS:  Notable for that above.   PHYSICAL EXAM:  height is _0  (1.727 m) and weight is 120 lb 6.4 oz (54.6 kg). His temperature is 97.6 F (36.4 C). His blood pressure is 136/84 and his pulse is 82. His respiration is 28 (abnormal) and oxygen saturation is 100%.   General: Alert and oriented, in no acute distress HEENT: Head is normocephalic. Extraocular movements are intact. Oropharynx is notable for no visible tumor  in the upper throat or mouth. Neck: Neck is notable for no obvious palpable adenopathy, trach intact. Heart: Regular in rate and rhythm   Chest: Clear to auscultation bilaterally  Abdomen: Soft, nontender, nondistended, with no rigidity or guarding. Musculoskeletal : He is ambulatory  neurologic:  No obvious focalities. Speech is fluent. Coordination is intact. Psychiatric: Judgment and insight are intact. Affect is appropriate.   ECOG = 1  0 - Asymptomatic (Fully active, able to  carry on all predisease activities without restriction)  1 - Symptomatic but completely ambulatory (Restricted in physically strenuous activity but ambulatory and able to carry out work of a light or sedentary nature. For example, light housework, office work)  2 - Symptomatic, <50% in bed during the day (Ambulatory and capable of all self care but unable to carry out any work activities. Up and about more than 50% of waking hours)  3 - Symptomatic, >50% in bed, but not bedbound (Capable of only limited self-care, confined to bed or chair 50% or more of waking hours)  4 - Bedbound (Completely disabled. Cannot carry on any self-care. Totally confined to bed or chair)  5 - Death   Eustace Pen MM, Creech RH, Tormey DC, et al. [phone]). "Toxicity and response criteria of the [facility]". [place] Oncol. 5 (6): 649-55   LABORATORY DATA:  Lab Results  Component Value Date   WBC 8.4 03/04/2020   HGB 14.0 03/04/2020   HCT 42.5 03/04/2020   MCV 92.8 03/04/2020   PLT 353 03/04/2020   CMP     Component Value Date/Time   NA 140 03/23/2020 1013   K 4.2 03/23/2020 1013   CL 102 03/23/2020 1013   CO2 27 03/23/2020 1013   GLUCOSE 118 (H) 03/23/2020 1013   BUN 26 (H) 03/23/2020 1013   CREATININE 0.79 03/23/2020 1013   CALCIUM 9.6 03/23/2020 1013   PROT 7.3 03/23/2020 1013   ALBUMIN 3.3 (L) 03/23/2020 1013   AST 24 03/23/2020 1013   ALT 41 03/23/2020 1013   ALKPHOS 88 03/23/2020 1013   BILITOT 0.3 03/23/2020 1013   GFRNONAA >60 03/23/2020 1013      Lab Results  Component Value Date   TSH 1.605 03/23/2020     RADIOGRAPHY: DG Chest 2 View  Result Date: 03/04/2020 CLINICAL DATA:  COPD exacerbation EXAM: CHEST - 2 VIEW COMPARISON:  11/25/2019 FINDINGS: The heart size and mediastinal contours are within normal limits. Atherosclerotic calcification of the aortic knob. Mildly hyperexpanded lungs. No focal airspace consolidation, pleural effusion, or pneumothorax. The  visualized skeletal structures are unremarkable. IMPRESSION: No active cardiopulmonary disease. Electronically Signed   By: Davina Poke D.O.   On: 03/04/2020 15:58   CT Soft Tissue Neck W Contrast  Result Date: 03/04/2020 CLINICAL DATA:  Soft tissue mass, neck, no prior imaging; smoker, hoarseness different than past, external neck swelling, patient reports sensation of internal edema. Additional history provided: Patient reports sore throat and shortness of breath since last Thursday, throat swelling. EXAM: CT NECK WITH CONTRAST TECHNIQUE: Multidetector CT imaging of the neck was performed using the standard protocol following the bolus administration of intravenous contrast. CONTRAST:  23m OMNIPAQUE IOHEXOL 300 MG/ML  SOLN COMPARISON:  Neck CT 09/29/2005. FINDINGS: Mildly motion degraded examination. Pharynx and larynx: Streak artifact from dental restoration partially obscures the oral cavity. No appreciable swelling or discrete mass within the oral cavity or pharynx. There is mucosal irregularity as well as mucosal/submucosal edema of the supraglottic and glottic larynx. Resultant  mild-to-moderate narrowing and mild rightward deviation of the airway at this level. There is additional edema within the ventral neck. Salivary glands: No inflammation, mass, or stone. Thyroid: Unremarkable. Lymph nodes: Enlarged left level 2/3 lymph node measuring 14 mm in short axis (series 6, image 62) Vascular: The major vascular structures of the neck are patent. Aortic atherosclerosis. Minimal atherosclerotic plaque within the left carotid bifurcation and proximal ICA. Limited intracranial: No acute intracranial abnormality identified. Visualized orbits: Excluded from the field of view. Mastoids and visualized paranasal sinuses: Trace ethmoid sinus mucosal thickening at the imaged levels. No significant mastoid effusion. Skeleton: No acute bony abnormality or aggressive osseous lesion. Upper chest: No consolidation  within the imaged lung apices. Centrilobular emphysema. Other: Nonspecific 1.4 cm part cystic/part solid cutaneous/subcutaneous lesion within the midline upper neck (series 2, image 42). These results were called by telephone at the time of interpretation on 03/04/2020 at 2:48 pm to provider PA Sherol Dade , who verbally acknowledged these results. IMPRESSION: Mucosal irregularity and mucosal/submucosal edema of the supraglottic and glottic larynx. Primary differential considerations include infectious/inflammatory laryngeal edema versus laryngeal malignancy. ENT consultation and direct visualization recommended. Associated mild-to-moderate narrowing, and mild rightward deviation, of the airway at this level. Additional edema within the cutaneous/subcutaneous ventral neck. Mildly enlarged left level 2/3 lymph node which may be reactive or could reflect metastatic nodal disease. Nonspecific 1.4 cm part cystic/part solid cutaneous/subcutaneous lesion within the midline upper neck. Direct visualization recommended. Emphysema (ICD10-J43.9). Aortic Atherosclerosis (ICD10-I70.0). Electronically Signed   By: Kellie Simmering DO   On: 03/04/2020 14:48      IMPRESSION/PLAN:  This is a delightful patient with head and neck cancer of the larynx  PET scan is pending.  He will be discussed at tumor board once all of his data is available.  Assuming he does not have metastatic disease, he understands that surgery may be an option, but that he would have to undergo laryngectomy.  He is not enthusiastic about this option.  He understands that the alternative to surgical treatment is definitive chemoradiation.  He also understands that if he undergoes surgery he will likely need radiation adjuvantly.  He seems to be leaning towards organ preservation with chemoradiation.  We discussed the potential risks, benefits, and side effects of radiotherapy. We talked in detail about acute and late effects. We discussed that some  of the most bothersome acute effects may be mucositis, dysgeusia, salivary changes, skin irritation, hair loss, dehydration, weight loss and fatigue. We talked about late effects which include but are not necessarily limited to dysphagia, hypothyroidism, nerve injury, vascular injury, spinal cord injury, xerostomia, trismus, neck edema, and potential injury to any of the tissues in the head and neck region. No guarantees of treatment were given. A consent form was signed and placed in the patient's medical record. The patient is enthusiastic about proceeding with treatment. I look forward to participating in the patient's care.    We also discussed that the treatment of head and neck cancer is a multidisciplinary process to maximize treatment outcomes and quality of life. For this reason the following referrals have been or will be made:   Medical oncology to discuss chemotherapy    Dentistry for dental evaluation, possible extractions in the radiation fields, and /or advice on reducing risk of cavities, osteoradionecrosis, or other oral issues.   Nutritionist for nutrition support during and after treatment.   Speech language pathology for swallowing and/or speech therapy.   Social work for social support.  Physical therapy due to risk of lymphedema in neck and deconditioning.   We discussed measures to reduce the risk of infection during the COVID-19 pandemic.  He has not yet been vaccinated.  I explained to him why I recommend, highly, that he get vaccinated.  He is ready to do so.  We will arrange his first vaccination to occur at the cancer center today.  I asked the patient today about tobacco use. The patient uses tobacco.  I advised the patient to quit. Services were offered by me today including outpatient counseling and pharmacotherapy. I assessed for the willingness to attempt to quit and provided encouragement and demonstrated willingness to make referrals and/or prescriptions to  help the patient attempt to quit. The patient has follow-up with the oncologic team to touch base on their tobacco use and /or cessation efforts.  Materials were given to him from our patient navigator to help with cessation support.  He knows to contact us if he wants any prescriptions to aid with cessation.  He will think about this.  On date of service, in total, I spent 60 minutes on this encounter. Patient was seen in person.  __________________________________________   Eppie Gibson, MD  This document serves as a record of services personally performed by Eppie Gibson, MD. It was created on his behalf by Clerance Lav, a trained medical scribe. The creation of this record is based on the scribe's personal observations and the provider's statements to them. This document has been checked and approved by the attending provider.

## 2020-03-22 NOTE — Progress Notes (Signed)
Head and Neck Cancer Location of Tumor / Histology:  Squamous cell carcinoma of larynx, p16(+)  Patient presented with symptoms of: six months of worsening hoarseness and throat pain previously treated presumptively for reflux but not really improving.  About 3 weeks ago, he had worsening difficulty breathing and went to the ER where he was treated with steroids and racemic epinephrine without improvement.  A neck CT scan demonstrated abnormal soft tissue in the larynx with partial airway obstruction.  ENT consultation was requested.  Denied any significant throat pain or difficulty swallowing.  Biopsies revealed:  03/05/2020 FINAL MICROSCOPIC DIAGNOSIS:  A. LEFT VOCAL CORD, EXCISION:  - Invasive moderately differentiated squamous cell carcinoma,  B. RIGHT VOCAL CORD, EXCISION:  - Invasive moderately differentiated squamous cell carcinoma,  C. LEFT FALSE VOCAL CORD, EXCISION:  - Invasive moderately differentiated squamous cell carcinoma, ADDENDUM:  A. Immunohistochemical stain for p16 is positive in the tumor cells.  Nutrition Status Yes No Comments  Weight changes? [x]  []  Reports ~15-20lb unintentional weight loss since hospitalization   Swallowing concerns? [x]  []  Reports coughing frequently when drinking thin liquids (especailly if they're very cold or very hot). Sometimes feels like what he's swallowing is regurgitating up into his trach  PEG? []  [x]     Referrals Yes No Comments  Social Work? [x]  []  Spoke with Webb Silversmith Cunningham-LCSW on 03/17/2020  Dentistry? [x]  []  Saw Dr. Sandi Mariscal on 03/18/2020  Swallowing therapy? [x]  []    Nutrition? [x]  []    Med/Onc? [x]  []  Met with Dr. Arletha Pili Iruku on 03/16/2020   Safety Issues Yes No Comments  Prior radiation? []  [x]    Pacemaker/ICD? []  [x]    Possible current pregnancy? []  [x]  N/A  Is the patient on methotrexate? []  [x]     Tobacco/Marijuana/Snuff/ETOH use: Currently smoking cigarettes less than 1 pack/day (trying to quit). He has  quit using smokeless tobacco (chew). He reports that he does not drink alcohol and does not use drugs.  Past/Anticipated interventions by otolaryngology, if any:  03/16/2020 Dr. Melida Quitter Impression & Plans:  - We discussed his cancer diagnosis. He is scheduled for Heme/onc and Rad/onc consultations and a PET scan. We again discussed options for management including laryngectomy with likely post-op radiation versus combined chemotherapy and radiation therapy. Some pros and cons of each approach were detailed. He will call me back after meeting with the oncologists. - We will order a back-up tracheostomy for him through home health. I encouraged good nutrition and calorie intake and gave him pointers on safe swallowing including chin-tuck and filling the lungs with air before swallowing.  03/05/2020 Dr. Melida Quitter AWAKE TRACHEOSTOMY, DIRECT LARYNGOSCOPY WITH BIOPSY   Past/Anticipated interventions by medical oncology, if any:  Under care of D.r Praveena Iruku 03/16/2020 1. SCC of larynx involving both VC, so atleast T2N1, staging incomplete at this time. --I do not understand the relevance of p16 positivity in Laryngeal primary. --We have discussed to complete staging with PET/CT.   --If larynx preserving surgery can be attempted, then this is certainly an available option for early stage laryngeal cancer. --If he needs to have total laryngectomy, then he can consider surgery vs ChemoRT. --If he decides to proceed with surgery upfront, then he may still chemoRT vs adjuvant RT alone. --He will meet Dr Isidore Moos, complete PET CT, then will come up with a plan. 2. Weight loss, unintentional likely secondary to malignancy as well as stress. --I have recommended nutritional referral and consider G tube if he needs to undergo ChemoRT 3. Nicotine abuse,  advised smoking cessation, We have offered assistance, he doesn't appear motivated at this time. 4. CAD S/P coronary angiography,  --Recommended  smoking cessation and FU with PCP for monitoring.  Current Complaints / other details:   PET scan ordered 04/05/2020

## 2020-03-23 ENCOUNTER — Inpatient Hospital Stay: Payer: 59

## 2020-03-23 ENCOUNTER — Ambulatory Visit
Admission: RE | Admit: 2020-03-23 | Discharge: 2020-03-23 | Disposition: A | Discharge status: Home / Self Care | Payer: 59 | Source: Ambulatory Visit | Attending: Radiation Oncology | Admitting: Radiation Oncology

## 2020-03-23 ENCOUNTER — Other Ambulatory Visit: Payer: Self-pay

## 2020-03-23 VITALS — BP 136/84 | HR 82 | Temp 97.6°F | Resp 28 | Ht 68.0 in | Wt 120.4 lb

## 2020-03-23 DIAGNOSIS — R634 Abnormal weight loss: Secondary | ICD-10-CM | POA: Diagnosis not present

## 2020-03-23 DIAGNOSIS — C329 Malignant neoplasm of larynx, unspecified: Secondary | ICD-10-CM

## 2020-03-23 DIAGNOSIS — I2510 Atherosclerotic heart disease of native coronary artery without angina pectoris: Secondary | ICD-10-CM | POA: Insufficient documentation

## 2020-03-23 DIAGNOSIS — F17210 Nicotine dependence, cigarettes, uncomplicated: Secondary | ICD-10-CM | POA: Diagnosis not present

## 2020-03-23 DIAGNOSIS — C321 Malignant neoplasm of supraglottis: Secondary | ICD-10-CM

## 2020-03-23 DIAGNOSIS — C328 Malignant neoplasm of overlapping sites of larynx: Secondary | ICD-10-CM | POA: Insufficient documentation

## 2020-03-23 DIAGNOSIS — Z23 Encounter for immunization: Secondary | ICD-10-CM

## 2020-03-23 LAB — MAGNESIUM: Magnesium: 1.8 mg/dL (ref 1.7–2.4)

## 2020-03-23 LAB — CMP (CANCER CENTER ONLY)
ALT: 41 U/L (ref 0–44)
AST: 24 U/L (ref 15–41)
Albumin: 3.3 g/dL — ABNORMAL LOW (ref 3.5–5.0)
Alkaline Phosphatase: 88 U/L (ref 38–126)
Anion gap: 11 (ref 5–15)
BUN: 26 mg/dL — ABNORMAL HIGH (ref 6–20)
CO2: 27 mmol/L (ref 22–32)
Calcium: 9.6 mg/dL (ref 8.9–10.3)
Chloride: 102 mmol/L (ref 98–111)
Creatinine: 0.79 mg/dL (ref 0.61–1.24)
GFR, Estimated: >60 mL/min
Glucose, Bld: 118 mg/dL — ABNORMAL HIGH (ref 70–99)
Potassium: 4.2 mmol/L (ref 3.5–5.1)
Sodium: 140 mmol/L (ref 135–145)
Total Bilirubin: 0.3 mg/dL (ref 0.3–1.2)
Total Protein: 7.3 g/dL (ref 6.5–8.1)

## 2020-03-23 LAB — TSH: TSH: 1.605 u[IU]/mL (ref 0.320–4.118)

## 2020-03-23 LAB — PHOSPHORUS: Phosphorus: 4 mg/dL (ref 2.5–4.6)

## 2020-03-23 NOTE — Progress Notes (Signed)
Oncology Nurse Navigator Documentation  Met with patient during initial consult with Mr. Scheel He was accompanied by his wife [name]. . Further introduced myself as his/their Navigator, explained my role as a member of the Care Team. . Provided New Patient Information packet: o Contact information for physician, this navigator, other members of the Care Team o Advance Directive information ([place] blue pamphlet with LCSW insert); provided [facility] AD booklet at his request,  o Fall Prevention Patient [place] sheet o Symptom Management Clinic information o [facility] map with highlight of [place] o SLP Information sheet . Provided and discussed educational handouts for PEG and PAC. Marland Kitchen Assisted with post-consult appt scheduling. He will be scheduled for CT simulatjion on 04/06/20 after his 04/05/20 PET.  . I provided them with a brief tour of the CT simulation area and radiation treatment area.  . They verbalized understanding of information provided. . I encouraged them to call with questions/concerns moving forward.  Harlow Asa, RN, BSN, OCN Head & Neck Oncology Nurse [place] [facility] [place] [phone]

## 2020-03-23 NOTE — Progress Notes (Signed)
   Covid-19 Vaccination Clinic  Name:  Erik [name]    MRN: [id number] DOB: 08-06-1965  03/23/2020  [name] was observed post Covid-19 immunization for 15 minutes without incident. He was provided with Vaccine Information Sheet and instruction to access the V-Safe system.   [name] was instructed to call 911 with any severe reactions post vaccine: Marland Kitchen Difficulty breathing  . Swelling of face and throat  . A fast heartbeat  . A bad rash all over body  . Dizziness and weakness   Immunizations Administered    Name Date Dose VIS Date Route   PFIZER Comrnaty(Gray TOP) Covid-19 Vaccine 03/23/2020 10:40 AM 0.3 mL 01/08/2020 Intramuscular   Manufacturer: [employer], [employer]   Lot: [id number]   NDC: [phone]

## 2020-03-24 ENCOUNTER — Telehealth: Payer: Self-pay | Admitting: Nutrition

## 2020-03-24 NOTE — Telephone Encounter (Signed)
Scheduled appt per 2/22 sch msg - unable to reach pt . Left message for pt with appt date and time

## 2020-03-25 ENCOUNTER — Telehealth: Payer: Self-pay | Admitting: *Deleted

## 2020-03-25 NOTE — Telephone Encounter (Signed)
Patient verified DOB MA spoke with patient and confirmed that CT had been approved by insurance. Patient was hospitalized where laryngeal cancer was noted. Patient is connected to oncology and has a PET scan scheduled. Patient would like to be advise if the CT is still necessary. CT approval information in case it is needed for scheduling: Procedure 71271 approved from 03/02/20-04/16/20 Reference #: [id number]

## 2020-03-25 NOTE — Telephone Encounter (Signed)
Please call patient with update.   Patient should still have CT for lung cancer screening.

## 2020-03-26 ENCOUNTER — Encounter: Payer: Self-pay | Admitting: Radiation Oncology

## 2020-03-29 ENCOUNTER — Other Ambulatory Visit (HOSPITAL_COMMUNITY)
Admission: RE | Admit: 2020-03-29 | Discharge: 2020-03-29 | Disposition: A | Discharge status: Home / Self Care | Payer: 59 | Source: Ambulatory Visit | Attending: Family | Admitting: Family

## 2020-03-29 ENCOUNTER — Telehealth: Payer: Self-pay | Admitting: Nutrition

## 2020-03-29 ENCOUNTER — Inpatient Hospital Stay: Payer: 59 | Admitting: Nutrition

## 2020-03-29 DIAGNOSIS — Z20822 Contact with and (suspected) exposure to covid-19: Secondary | ICD-10-CM | POA: Insufficient documentation

## 2020-03-29 DIAGNOSIS — Z01812 Encounter for preprocedural laboratory examination: Secondary | ICD-10-CM | POA: Diagnosis present

## 2020-03-29 NOTE — Telephone Encounter (Signed)
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 Isidore Moos and Dr. Chryl Heck.  Past medical history includes CAD, GERD, and trach.  Medications include vitamin D and Prilosec.  Labs include glucose 118, BUN 26, albumin 3.3.  Height: 68 inches. Weight: 120.4 pounds. Usual body weight: 145 pounds per patient. BMI: 18.31.  Patient status post hospitalization where he was diagnosed with moderate malnutrition in the context of chronic illness.  He was prescribed Ensure however he does not care for this. He has been drinking a low calorie high protein oral supplement equivalent to Premier protein. Denies difficulty chewing and swallowing. Reports increased protein seems to be causing constipation.  Nutrition diagnosis: Unintended weight loss related to [place] cancer and associated treatments as evidenced by 17% weight loss from usual body weight.  Intervention: Educated patient to change oral nutrition supplements to boost plus or equivalent providing at least 350 cal.  Encouraged 3 times daily between meals. Brief education provided on increasing calories and protein in small amounts throughout Educated on strategies for improving constipation. Mailed fact sheets with contact information. Questions were answered.  Teach back method used.  Patient has contact information.  Monitoring, evaluation, goals: Tolerate adequate calories and protein to promote weight gain.  Next Visit: To be scheduled with upcoming treatments.  **Disclaimer: This note was dictated with voice recognition software. Similar sounding words can inadvertently be transcribed and this note may contain transcription errors which may not have been corrected upon publication of note.**

## 2020-03-29 NOTE — Telephone Encounter (Signed)
Per PCP patient does need to complete the CT for lung cancer. Please schedule and contact patient. MA placed approval information under telephone note for reference.

## 2020-03-29 NOTE — Progress Notes (Signed)
See telephone note.

## 2020-03-30 ENCOUNTER — Other Ambulatory Visit: Payer: Self-pay

## 2020-03-30 DIAGNOSIS — C329 Malignant neoplasm of larynx, unspecified: Secondary | ICD-10-CM

## 2020-03-30 LAB — SARS CORONAVIRUS 2 (TAT 6-24 HRS): SARS Coronavirus 2: NEGATIVE

## 2020-03-30 NOTE — Progress Notes (Signed)
Covid negative

## 2020-04-01 ENCOUNTER — Other Ambulatory Visit: Payer: Self-pay

## 2020-04-01 ENCOUNTER — Telehealth: Payer: Self-pay

## 2020-04-01 ENCOUNTER — Ambulatory Visit (HOSPITAL_COMMUNITY)
Admission: RE | Admit: 2020-04-01 | Discharge: 2020-04-01 | Disposition: A | Discharge status: Home / Self Care | Payer: 59 | Source: Ambulatory Visit | Attending: Acute Care | Admitting: Acute Care

## 2020-04-01 DIAGNOSIS — F17210 Nicotine dependence, cigarettes, uncomplicated: Secondary | ICD-10-CM | POA: Insufficient documentation

## 2020-04-01 DIAGNOSIS — Z930 Tracheostomy status: Secondary | ICD-10-CM

## 2020-04-01 DIAGNOSIS — Z430 Encounter for attention to tracheostomy: Secondary | ICD-10-CM | POA: Diagnosis present

## 2020-04-01 DIAGNOSIS — Z8521 Personal history of malignant neoplasm of larynx: Secondary | ICD-10-CM | POA: Diagnosis not present

## 2020-04-01 DIAGNOSIS — C760 Malignant neoplasm of head, face and neck: Secondary | ICD-10-CM

## 2020-04-01 NOTE — Progress Notes (Addendum)
Tracheostomy Procedure Note  [facility] [employer] [id number] 1965/02/08  Pre Procedure Tracheostomy Information  Trach Brand: Shiley Size: 4.0 flexible  #[id number] Style: Uncuffed Secured by: Velcro   Procedure: Trach change and trach cleaning    Post Procedure Tracheostomy Information  Trach Brand: Shiley Size: 4.0 flexible #[id number] Style: Uncuffed Secured by: Velcro   Post Procedure Evaluation:  ETCO2 positive color change from yellow to purple : Yes.   Vital signs:VSS Patients current condition: stable Complications: No apparent complications Trach site exam: clean and dry Wound care done: 4x4 drain gauze applied Patient did tolerate procedure well.   Education: New trach education  Prescription needs: Home supply orders done by NP from trach clinic    Additional needs: New PMV given to patient at this visit  Also additional  4 flexible  trach  [phone]

## 2020-04-01 NOTE — Telephone Encounter (Signed)
Order sent.   Nothing further needed at this time.

## 2020-04-01 NOTE — Telephone Encounter (Signed)
-----  Message from Erick Colace, NP sent at 04/01/2020  1:48 PM EST ----- Regarding: DME needs Hey guys  Can you guys help me with getting this guy the folllowing DME supplies  # 30-d supply trach care Kit # 30 d supply 10 fr suction catheter # 30 trach drain dressing  # 4 trach tube collar/holder # 4 HME device  # 1 suction machine  Reflill PRN  He is followed by [facility] Also needs cuffless shiley model [id number]  Thanks! Laurey Arrow

## 2020-04-01 NOTE — Progress Notes (Signed)
Reason for visit Tracheostomy care, and to become established in tracheostomy clinic  Referring physician: Dr. Eppie Gibson  HPI 55 year old male with recently diagnosed laryngeal cancer.  He is status post tracheostomy placement by Dr. Redmond Baseman on 2/4 He was last seen in the oncology office on 2/22.  Discussions were had regarding possible laryngectomy versus chemo/radiation.  At this point the plan is to proceed with this modality of care.  He presents today to get established in tracheostomy clinic.  From a tracheostomy standpoint he has been doing well, he is able to manage cleaning his tracheostomy, does not require suction, he does note exertional dyspnea, most notably whenever he has his Passy-Muir valve in place.  Past Medical History:  Diagnosis Date  . Coronary artery disease   . Dyspnea   . GERD (gastroesophageal reflux disease)    Phreesia 02/21/2020  . Ruptured lumbar intervertebral disc    Social History   Socioeconomic History  . Marital status: Married    Spouse name: Not on file  . Number of children: Not on file  . Years of education: Not on file  . Highest education level: Not on file  Occupational History  . Not on file  Tobacco Use  . Smoking status: Current Every Day Smoker    Types: Cigarettes  . Smokeless tobacco: Former Systems developer    Types: Secondary school teacher  . Vaping Use: Former  Substance and Sexual Activity  . Alcohol use: No  . Drug use: No  . Sexual activity: Yes  Other Topics Concern  . Not on file  Social History Narrative  . Not on file  No Known Allergies Prior to Admission medications   Medication Sig Start Date End Date Taking? Authorizing Provider  albuterol (VENTOLIN HFA) 108 (90 Base) MCG/ACT inhaler Inhale 2 puffs into the lungs every 6 (six) hours as needed for wheezing or shortness of breath. 02/24/20   [name], NP  benzonatate ([name]) 100 MG capsule Take 1 capsule (100 mg total) by mouth 3 (three) times daily as needed for  cough. 02/24/20   [name], NP  cholecalciferol (VITAMIN D3) 25 MCG (1000 UNIT) tablet Take 1,000 Units by mouth daily.    [provider]  ibuprofen (ADVIL,MOTRIN) 200 MG tablet Take 800 mg by mouth every 8 (eight) hours as needed for mild pain. For pain    [provider]  Multiple Vitamin (MULTIVITAMIN WITH MINERALS) TABS tablet Take 1 tablet by mouth daily.    [provider]  omeprazole (PRILOSEC) 20 MG capsule Take 1 capsule (20 mg total) by mouth 2 (two) times daily before a meal. Patient taking differently: Take 20 mg by mouth daily. 11/25/19 12/25/19  Corena Herter, PA-C   Review of Systems  Constitutional: Positive for weight loss. Negative for fever and malaise/fatigue.  HENT: Negative for congestion and sinus pain.        Often will note more shortness of breath particularly with Passy-Muir valve is in place  Eyes: Negative.   Respiratory: Positive for shortness of breath. Negative for cough, hemoptysis and wheezing.   Cardiovascular: Positive for leg swelling. Negative for chest pain and palpitations.  Gastrointestinal: Negative.   Genitourinary: Negative.   Musculoskeletal: Negative.   Skin: Negative.   Neurological: Negative.   Endo/Heme/Allergies: Negative.   Psychiatric/Behavioral: Negative.    Exam  General: This is a frail 55 year old male presents to tracheostomy clinic today for initial evaluation and to become established he is in no  acute distress he is ambulatory and walked into the clinic HEENT normocephalic atraumatic mucous membranes are moist he does have quite coarse phonation, notable trapped air with rapid rush of air from tracheostomy when Passy-Muir valve removed does seem to suggest some degree of ongoing upper airway obstruction Pulmonary: Clear to auscultation without accessory use Cardiac: Regular rate and rhythm Abdomen: Soft not tender no organomegaly Extremities warm dry brisk capillary refill Neuro:  Intact.  Procedure The existing size 4 cuffless tracheostomy was removed the tracheostomy stoma was inspected there is a small amount of granulation tissue on the bottom of the tracheostomy this is noninflamed, not draining.  It is on the bottom portion of the trach.  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  Recently diagnosed with head neck cancer, yet to undergo chemo/radiation therapy.  He has plan for initiating radiation later this month.  At this point he is tracheostomy dependence especially in the setting of what seems to be upper airway obstruction  Plan He will keep his tracheostomy, we can reassess how he does after chemo and radiation however given his current exam I strongly feel he needs to have direct visualization by ENT to ensure adequate airway above level of trach prior to any consideration for decannulation In the meantime we will continue routine tracheostomy care I have instructed him to use his Passy-Muir valve only when talking and remove this during exertion and when he is sleeping  My time 46 minutes Erick Colace ACNP-BC [place] Pager # [phone] OR # [phone] if no answer\

## 2020-04-05 ENCOUNTER — Other Ambulatory Visit: Payer: Self-pay

## 2020-04-05 ENCOUNTER — Ambulatory Visit (HOSPITAL_COMMUNITY)
Admission: RE | Admit: 2020-04-05 | Discharge: 2020-04-05 | Disposition: A | Discharge status: Home / Self Care | Payer: 59 | Source: Ambulatory Visit | Attending: Hematology and Oncology | Admitting: Hematology and Oncology

## 2020-04-05 DIAGNOSIS — C329 Malignant neoplasm of larynx, unspecified: Secondary | ICD-10-CM | POA: Diagnosis present

## 2020-04-05 LAB — GLUCOSE, CAPILLARY: Glucose-Capillary: 82 mg/dL (ref 70–99)

## 2020-04-05 MED ORDER — FLUDEOXYGLUCOSE F - 18 (FDG) INJECTION
11.0000 | Freq: Once | INTRAVENOUS | Status: AC
  Administered 2020-04-05: 6 via INTRAVENOUS

## 2020-04-06 ENCOUNTER — Ambulatory Visit: Payer: 59 | Admitting: Family

## 2020-04-06 ENCOUNTER — Ambulatory Visit
Admission: RE | Admit: 2020-04-06 | Discharge: 2020-04-06 | Disposition: A | Discharge status: Home / Self Care | Payer: 59 | Source: Ambulatory Visit | Attending: Radiation Oncology | Admitting: Radiation Oncology

## 2020-04-06 ENCOUNTER — Encounter: Payer: 59 | Admitting: Family

## 2020-04-06 ENCOUNTER — Inpatient Hospital Stay: Payer: 59 | Attending: Hematology and Oncology | Admitting: Hematology and Oncology

## 2020-04-06 VITALS — BP 134/85 | HR 67 | Temp 98.6°F | Resp 19

## 2020-04-06 VITALS — BP 140/83 | HR 71 | Temp 97.8°F | Resp 21 | Ht 68.0 in | Wt 125.0 lb

## 2020-04-06 DIAGNOSIS — C321 Malignant neoplasm of supraglottis: Secondary | ICD-10-CM

## 2020-04-06 DIAGNOSIS — I2510 Atherosclerotic heart disease of native coronary artery without angina pectoris: Secondary | ICD-10-CM | POA: Insufficient documentation

## 2020-04-06 DIAGNOSIS — R110 Nausea: Secondary | ICD-10-CM | POA: Insufficient documentation

## 2020-04-06 DIAGNOSIS — F17210 Nicotine dependence, cigarettes, uncomplicated: Secondary | ICD-10-CM | POA: Diagnosis not present

## 2020-04-06 DIAGNOSIS — R490 Dysphonia: Secondary | ICD-10-CM | POA: Diagnosis not present

## 2020-04-06 DIAGNOSIS — C329 Malignant neoplasm of larynx, unspecified: Secondary | ICD-10-CM | POA: Diagnosis not present

## 2020-04-06 DIAGNOSIS — R634 Abnormal weight loss: Secondary | ICD-10-CM | POA: Diagnosis not present

## 2020-04-06 DIAGNOSIS — R432 Parageusia: Secondary | ICD-10-CM | POA: Diagnosis not present

## 2020-04-06 DIAGNOSIS — Z79899 Other long term (current) drug therapy: Secondary | ICD-10-CM | POA: Diagnosis not present

## 2020-04-06 DIAGNOSIS — K219 Gastro-esophageal reflux disease without esophagitis: Secondary | ICD-10-CM | POA: Diagnosis not present

## 2020-04-06 DIAGNOSIS — Z931 Gastrostomy status: Secondary | ICD-10-CM | POA: Diagnosis not present

## 2020-04-06 DIAGNOSIS — T451X5A Adverse effect of antineoplastic and immunosuppressive drugs, initial encounter: Secondary | ICD-10-CM | POA: Insufficient documentation

## 2020-04-06 DIAGNOSIS — Z5111 Encounter for antineoplastic chemotherapy: Secondary | ICD-10-CM | POA: Insufficient documentation

## 2020-04-06 MED ORDER — SODIUM CHLORIDE 0.9% FLUSH
10.0000 mL | Freq: Once | INTRAVENOUS | Status: AC
  Administered 2020-04-06: 10 mL via INTRAVENOUS

## 2020-04-06 NOTE — Progress Notes (Signed)
Has armband been applied?  Yes.    Does patient have an allergy to IV contrast dye?: No.   Has patient ever received premedication for IV contrast dye?: No.   Does patient take metformin?: No.  Date of lab work: March 23, 2020 BUN: 26 CR: 0.79  IV site: forearm right, condition patent and no redness  Has IV site been added to flowsheet?  Yes.     Vitals:   04/06/20 1351  BP: 134/85  Pulse: 67  Resp: 19  Temp: 98.6 F (37 C)  SpO2: 100%

## 2020-04-06 NOTE — Progress Notes (Signed)
[facility] CONSULT NOTE  Patient Care Team: Patient, No Pcp Per as PCP - General (General Practice)  CHIEF COMPLAINTS/PURPOSE OF CONSULTATION:  Laryngeal cancer.  ASSESSMENT & PLAN:  No problem-specific Assessment & Plan notes found for this encounter.  No orders of the defined types were placed in this encounter.  1. SCC of larynx involving both VC,T2N1M0 PET staging didn't show definitive evidence of metastatic disease. He doesn't want to proceed with surgery given his hope for voice preservation He understands that if cRT doesn't provide him with complete response, he may still need salvage surgery I think CRT is reasonable in this patient given young age and decent PS. We discussed weekly cisplatin versus every 21 days cisplatin.  Have discussed that there is no head on head comparison between these 2 regimens but weekly cisplatin is well-tolerated and has less toxicity and is more likely that the patient will complete anticipated CRT with weekly cisplatin.  He understands the above-mentioned recommendations and he is willing to proceed with weekly cisplatin. We have discussed adverse effects including but not limited to fatigue, nausea, vomiting, small risk of nephrotoxicity, ototoxicity and neuropathy.  There is increased risk of infections with chemotherapy.   He is agreeable to these recommendations and will proceed with concurrent CRT, anticipated start date 04/14/2020.  2.  Weight loss secondary to malignancy, recommend G-tube placement prophylactically to assist with nutrition.  He will also benefit from nutrition referral.  3.  Nicotine abuse, strongly encourage smoking cessation  He is agreeable to proceed with all the recommendations. We will schedule return to clinic follow-ups  HISTORY OF PRESENTING ILLNESS:  Erik [name] 55 y.o. male is here because of new diagnosis of laryngeal cancer  Chronology  Pt had noticed ongoing hoarseness for almost 6  months, and was seen by a doctor who suggested that it may be related to GERD He used medication for GERD but his hoarseness continued to worsen. He then started getting SOB and went to the ER. He was found to have airway obstruction and underwent elective tracheostomy and direct laryngoscopy with biopsy.  Pertinent imaging and pathology results so far.  03/04/2020  Mucosal irregularity and mucosal/submucosal edema of the supraglottic and glottic larynx. Primary differential considerations include infectious/inflammatory laryngeal edema versus laryngeal malignancy. ENT consultation and direct visualization recommended. Associated mild-to-moderate narrowing, and mild rightward deviation, of the airway at this level.  Additional edema within the cutaneous/subcutaneous ventral neck.  Mildly enlarged left level 2/3 lymph node which may be reactive or could reflect metastatic nodal disease.  Nonspecific 1.4 cm part cystic/part solid cutaneous/subcutaneous lesion within the midline upper neck. Direct visualization recommended.  03/05/2020  FINAL MICROSCOPIC DIAGNOSIS:   A. LEFT VOCAL CORD, EXCISION:  - Invasive moderately differentiated squamous cell carcinoma, see  comment   B. RIGHT VOCAL CORD, EXCISION:  - Invasive moderately differentiated squamous cell carcinoma, see  comment   C. LEFT FALSE VOCAL CORD, EXCISION:  - Invasive moderately differentiated squamous cell carcinoma, see  comment   P 16 positive  INTERVAL HISTORY  Erik [name] arrived to the appointment today with his wife.  He continues to lose weight, has lost over 30 pounds of weight so far.  He continues to have hoarseness of voice.  He has tracheostomy.  He does not have an established diagnosis of COPD but is a heavy smoker for over 35 pack years.  He denies any changes in breathing particularly, no change in bowel habits or urinary habits. No new neurological  complaints.  REVIEW OF SYSTEMS:    Constitutional: Denies fevers, chills or abnormal night sweats Eyes: Denies blurriness of vision, double vision or watery eyes Ears, nose, mouth, throat, and face: As mentioned above respiratory: Denies cough, dyspnea or wheezes Cardiovascular: Denies palpitation, chest discomfort or lower extremity swelling Gastrointestinal:  Denies nausea, heartburn or change in bowel habits Skin: Denies abnormal skin rashes Lymphatics: Denies new lymphadenopathy or easy bruising Neurological:Denies numbness, tingling or new weaknesses Behavioral/Psych: Mood is stable, no new changes  All other systems were reviewed with the patient and are negative.  MEDICAL HISTORY:  Past Medical History:  Diagnosis Date  . Coronary artery disease   . Dyspnea   . GERD (gastroesophageal reflux disease)    Phreesia 02/21/2020  . Ruptured lumbar intervertebral disc     SURGICAL HISTORY: Past Surgical History:  Procedure Laterality Date  . CARDIAC CATHETERIZATION     with stent placement  . TONSILLECTOMY    . TRACHEOSTOMY TUBE PLACEMENT N/A 03/05/2020   Procedure: AWAKE TRACHEOSTOMY, DIRECT LARYNGOSCOPY WITH BIOPSY;  Surgeon: Melida Quitter, MD;  Location: [facility];  Service: ENT;  Laterality: N/A;    SOCIAL HISTORY: Social History   Socioeconomic History  . Marital status: Married    Spouse name: Not on file  . Number of children: Not on file  . Years of education: Not on file  . Highest education level: Not on file  Occupational History  . Not on file  Tobacco Use  . Smoking status: Current Every Day Smoker    Types: Cigarettes  . Smokeless tobacco: Former Systems developer    Types: Secondary school teacher  . Vaping Use: Former  Substance and Sexual Activity  . Alcohol use: No  . Drug use: No  . Sexual activity: Yes  Other Topics Concern  . Not on file  Social History Narrative  . Not on file   Social Determinants of Health   Financial Resource Strain: Not on file  Food Insecurity: Not on file   Transportation Needs: No Transportation Needs  . Lack of Transportation (Medical): No  . Lack of Transportation (Non-Medical): No  Physical Activity: Not on file  Stress: Not on file  Social Connections: Not on file  Intimate Partner Violence: Not on file    FAMILY HISTORY: No family history on file.  ALLERGIES:  has No Known Allergies.  MEDICATIONS:  Current Outpatient Medications  Medication Sig Dispense Refill  . albuterol (VENTOLIN HFA) 108 (90 Base) MCG/ACT inhaler Inhale 2 puffs into the lungs every 6 (six) hours as needed for wheezing or shortness of breath. 8 g 0  . benzonatate ([name]) 100 MG capsule Take 1 capsule (100 mg total) by mouth 3 (three) times daily as needed for cough. 20 capsule 0  . cholecalciferol (VITAMIN D3) 25 MCG (1000 UNIT) tablet Take 1,000 Units by mouth daily.    Marland Kitchen ibuprofen (ADVIL,MOTRIN) 200 MG tablet Take 800 mg by mouth every 8 (eight) hours as needed for mild pain. For pain    . Multiple Vitamin (MULTIVITAMIN WITH MINERALS) TABS tablet Take 1 tablet by mouth daily.    Marland Kitchen omeprazole (PRILOSEC) 20 MG capsule Take 1 capsule (20 mg total) by mouth 2 (two) times daily before a meal. (Patient taking differently: Take 20 mg by mouth daily.) 60 capsule 1   No current facility-administered medications for this visit.     PHYSICAL EXAMINATION:  ECOG PERFORMANCE STATUS: 0 - Asymptomatic  Vitals:   04/06/20 1415  BP: 140/83  Pulse: 71  Resp: (!) 21  Temp: 97.8 F (36.6 C)  SpO2: 100%   Filed Weights   04/06/20 1415  Weight: 125 lb (56.7 kg)    GENERAL:alert, no distress and comfortable NECK:  Tracheostomy in place, ongoing cough and clear sputum from the trach site LYMPH: No obvious palpable lymphadenopathy LUNGS: clear to auscultation and percussion with normal breathing effort HEART: regular rate & rhythm and no murmurs and no lower extremity edema ABDOMEN:abdomen soft, non-tender and normal bowel sounds Musculoskeletal:no  cyanosis of digits and no clubbing  PSYCH: alert & oriented x 3 with fluent speech NEURO: no focal motor/sensory deficits  LABORATORY DATA:  I have reviewed the data as listed Lab Results  Component Value Date   WBC 8.4 03/04/2020   HGB 14.0 03/04/2020   HCT 42.5 03/04/2020   MCV 92.8 03/04/2020   PLT 353 03/04/2020     Chemistry      Component Value Date/Time   NA 140 03/23/2020 1013   K 4.2 03/23/2020 1013   CL 102 03/23/2020 1013   CO2 27 03/23/2020 1013   BUN 26 (H) 03/23/2020 1013   CREATININE 0.79 03/23/2020 1013      Component Value Date/Time   CALCIUM 9.6 03/23/2020 1013   ALKPHOS 88 03/23/2020 1013   AST 24 03/23/2020 1013   ALT 41 03/23/2020 1013   BILITOT 0.3 03/23/2020 1013       RADIOGRAPHIC STUDIES: I have personally reviewed the radiological images as listed and agreed with the findings in the report. NM PET Image Initial (PI) Skull Base To Thigh  Result Date: 04/05/2020 CLINICAL DATA:  Initial treatment strategy for head neck cancer. EXAM: NUCLEAR MEDICINE PET SKULL BASE TO THIGH TECHNIQUE: 6.0 mCi F-18 FDG was injected intravenously. Full-ring PET imaging was performed from the skull base to thigh after the radiotracer. CT data was obtained and used for attenuation correction and anatomic localization. Fasting blood glucose: 82 mg/dl COMPARISON:  Neck CT 03/04/2020 FINDINGS: Mediastinal blood pool activity: SUV max 1.8 Liver activity: SUV max NA NECK: Marked hypermetabolism is identified in the glottic region, compatible with the patient's known primary. SUV max = 17.3. The left-sided level II lymph node identified on diagnostic neck CT shows low level hypermetabolism with SUV max = 2.0. Activity along the patient's tracheostomy tube is compatible with uptake related to scarring/granulation. The 1.4 cm part cystic/part solid cutaneous/subcutaneous lesion described in the midline posterior upper neck on previous CT shows no hypermetabolism. Incidental CT findings:  none CHEST: 9 mm ground-glass opacity in the posterior right upper lobe (84/4) shows low level FDG uptake with SUV max = 1.1. No hypermetabolic lymphadenopathy in the chest. Incidental CT findings: Coronary artery calcification is evident. Atherosclerotic calcification is noted in the wall of the thoracic aorta. Centrilobular emphysema noted. ABDOMEN/PELVIS: No abnormal hypermetabolic activity within the liver, pancreas, adrenal glands, or spleen. No hypermetabolic lymph nodes in the abdomen or pelvis. Isolated focus of uptake anteromedial to the left psoas muscle is likely excreted radiotracer within the ureter. Incidental CT findings: There is abdominal aortic atherosclerosis without aneurysm. SKELETON: No focal hypermetabolic activity to suggest skeletal metastasis. Incidental CT findings: none IMPRESSION: 1. Marked hypermetabolism in the glottic region, consistent with the patient's known primary neoplasm. 2. Left-sided level II lymph node described on previous diagnostic neck CT shows low level FDG accumulation, raising concern for metastatic involvement. 3. No evidence for hypermetabolic metastatic disease in the chest, abdomen, or pelvis. 4. 9 mm ground-glass opacity in the posterior  right upper lobe with low level FDG uptake. This may be infectious/inflammatory. Continued attention on follow-up recommended. 5.  Aortic Atherosclerois (ICD10-170.0) 6.  Emphysema. ([id number]) Electronically Signed   By: Misty Stanley M.D.   On: 04/05/2020 15:18    All questions were answered. The patient knows to call the clinic with any problems, questions or concerns. I spent 40 minutes in the care of this patient including H and P, review of records, counseling and coordination of care.     Benay Pike, MD 04/06/2020 2:20 PM

## 2020-04-07 ENCOUNTER — Other Ambulatory Visit: Payer: Self-pay

## 2020-04-07 ENCOUNTER — Ambulatory Visit (INDEPENDENT_AMBULATORY_CARE_PROVIDER_SITE_OTHER): Payer: 59 | Admitting: Family

## 2020-04-07 ENCOUNTER — Encounter: Payer: Self-pay | Admitting: Hematology and Oncology

## 2020-04-07 ENCOUNTER — Encounter: Payer: Self-pay | Admitting: Family

## 2020-04-07 VITALS — BP 146/88 | HR 82 | Wt 124.8 lb

## 2020-04-07 DIAGNOSIS — C329 Malignant neoplasm of larynx, unspecified: Secondary | ICD-10-CM

## 2020-04-07 DIAGNOSIS — R059 Cough, unspecified: Secondary | ICD-10-CM

## 2020-04-07 DIAGNOSIS — Z930 Tracheostomy status: Secondary | ICD-10-CM

## 2020-04-07 DIAGNOSIS — C760 Malignant neoplasm of head, face and neck: Secondary | ICD-10-CM

## 2020-04-07 NOTE — Progress Notes (Signed)
Follow -up Pt declined physical today  Has a cough can't get rid of

## 2020-04-07 NOTE — Progress Notes (Signed)
START ON PATHWAY REGIMEN - Head and Neck     A cycle is every 7 days:     Cisplatin   **Always confirm dose/schedule in your pharmacy ordering system**  Patient Characteristics: Larynx, Preoperative or Nonsurgical Candidate, Stage III - IVB Disease Classification: Larynx AJCC T Category: T2 AJCC 8 Stage Grouping: III Therapeutic Status: Preoperative or Nonsurgical Candidate AJCC N Category: cN1 AJCC M Category: M0 Intent of Therapy: Curative Intent, Discussed with Patient

## 2020-04-07 NOTE — Patient Instructions (Addendum)
Keep appointments with Oncology and Pulmonology.   Follow-up with primary provider as needed.   DASH Eating Plan DASH stands for Dietary Approaches to Stop Hypertension. The DASH eating plan is a healthy eating plan that has been shown to:  Reduce high blood pressure (hypertension).  Reduce your risk for type 2 diabetes, heart disease, and stroke.  Help with weight loss. What are tips for following this plan? Reading food labels  Check food labels for the amount of salt (sodium) per serving. Choose foods with less than 5 percent of the Daily Value of sodium. Generally, foods with less than 300 milligrams (mg) of sodium per serving fit into this eating plan.  To find whole grains, look for the word "whole" as the first word in the ingredient list. Shopping  Buy products labeled as "low-sodium" or "no salt added."  Buy fresh foods. Avoid canned foods and pre-made or frozen meals. Cooking  Avoid adding salt when cooking. Use salt-free seasonings or herbs instead of table salt or sea salt. Check with your health care provider or pharmacist before using salt substitutes.  Do not fry foods. Cook foods using healthy methods such as baking, boiling, grilling, roasting, and broiling instead.  Cook with heart-healthy oils, such as olive, canola, avocado, soybean, or sunflower oil. Meal planning  Eat a balanced diet that includes: ? 4 or more servings of fruits and 4 or more servings of vegetables each day. Try to fill one-half of your plate with fruits and vegetables. ? 6-8 servings of whole grains each day. ? Less than 6 oz (170 g) of lean meat, poultry, or fish each day. A 3-oz (85-g) serving of meat is about the same size as a deck of cards. One egg equals 1 oz (28 g). ? 2-3 servings of low-fat dairy each day. One serving is 1 cup (237 mL). ? 1 serving of nuts, seeds, or beans 5 times each week. ? 2-3 servings of heart-healthy fats. Healthy fats called omega-3 fatty acids are found  in foods such as walnuts, flaxseeds, fortified milks, and eggs. These fats are also found in cold-water fish, such as sardines, salmon, and mackerel.  Limit how much you eat of: ? Canned or prepackaged foods. ? Food that is high in trans fat, such as some fried foods. ? Food that is high in saturated fat, such as fatty meat. ? Desserts and other sweets, sugary drinks, and other foods with added sugar. ? Full-fat dairy products.  Do not salt foods before eating.  Do not eat more than 4 egg yolks a week.  Try to eat at least 2 vegetarian meals a week.  Eat more home-cooked food and less restaurant, buffet, and fast food.   Lifestyle  When eating at a restaurant, ask that your food be prepared with less salt or no salt, if possible.  If you drink alcohol: ? Limit how much you use to:  0-1 drink a day for women who are not pregnant.  0-2 drinks a day for men. ? Be aware of how much alcohol is in your drink. In the [place] one drink equals one 12 oz bottle of beer (355 mL), one 5 oz glass of wine (148 mL), or one 1 oz glass of hard liquor (44 mL). General information  Avoid eating more than 2,300 mg of salt a day. If you have hypertension, you may need to reduce your sodium intake to 1,500 mg a day.  Work with your health care provider to maintain  a healthy body weight or to lose weight. Ask what an ideal weight is for you.  Get at least 30 minutes of exercise that causes your heart to beat faster (aerobic exercise) most days of the week. Activities may include walking, swimming, or biking.  Work with your health care provider or dietitian to adjust your eating plan to your individual calorie needs. What foods should I eat? Fruits All fresh, dried, or frozen fruit. Canned fruit in natural juice (without added sugar). Vegetables Fresh or frozen vegetables (raw, steamed, roasted, or grilled). Low-sodium or reduced-sodium tomato and vegetable juice. Low-sodium or reduced-sodium tomato  sauce and tomato paste. Low-sodium or reduced-sodium canned vegetables. Grains Whole-grain or whole-wheat bread. Whole-grain or whole-wheat pasta. Brown rice. [place]. Bulgur. Whole-grain and low-sodium cereals. Pita bread. Low-fat, low-sodium crackers. Whole-wheat flour tortillas. Meats and other proteins Skinless chicken or [place]. Ground chicken or [place]. Pork with fat trimmed off. Fish and seafood. Egg whites. Dried beans, peas, or lentils. Unsalted nuts, nut butters, and seeds. Unsalted canned beans. Lean cuts of beef with fat trimmed off. Low-sodium, lean precooked or cured meat, such as sausages or meat loaves. Dairy Low-fat (1%) or fat-free (skim) milk. Reduced-fat, low-fat, or fat-free cheeses. Nonfat, low-sodium ricotta or cottage cheese. Low-fat or nonfat yogurt. Low-fat, low-sodium cheese. Fats and oils Soft margarine without trans fats. Vegetable oil. Reduced-fat, low-fat, or light mayonnaise and salad dressings (reduced-sodium). Canola, safflower, olive, avocado, soybean, and sunflower oils. Avocado. Seasonings and condiments Herbs. Spices. Seasoning mixes without salt. Other foods Unsalted popcorn and pretzels. Fat-free sweets. The items listed above may not be a complete list of foods and beverages you can eat. Contact a dietitian for more information. What foods should I avoid? Fruits Canned fruit in a light or heavy syrup. Fried fruit. Fruit in cream or butter sauce. Vegetables Creamed or fried vegetables. Vegetables in a cheese sauce. Regular canned vegetables (not low-sodium or reduced-sodium). Regular canned tomato sauce and paste (not low-sodium or reduced-sodium). Regular tomato and vegetable juice (not low-sodium or reduced-sodium). Angie Fava. Olives. Grains Baked goods made with fat, such as croissants, muffins, or some breads. Dry pasta or rice meal packs. Meats and other proteins Fatty cuts of meat. Ribs. Fried meat. Berniece Salines. Bologna, salami, and other precooked  or cured meats, such as sausages or meat loaves. Fat from the back of a pig (fatback). Bratwurst. Salted nuts and seeds. Canned beans with added salt. Canned or smoked fish. Whole eggs or egg yolks. Chicken or [place] with skin. Dairy Whole or 2% milk, cream, and half-and-half. Whole or full-fat cream cheese. Whole-fat or sweetened yogurt. Full-fat cheese. Nondairy creamers. Whipped toppings. Processed cheese and cheese spreads. Fats and oils Butter. Stick margarine. Lard. Shortening. Ghee. Bacon fat. Tropical oils, such as coconut, palm kernel, or palm oil. Seasonings and condiments Onion salt, garlic salt, seasoned salt, table salt, and sea salt. Worcestershire sauce. Tartar sauce. Barbecue sauce. Teriyaki sauce. Soy sauce, including reduced-sodium. Steak sauce. Canned and packaged gravies. Fish sauce. Oyster sauce. Cocktail sauce. Store-bought horseradish. Ketchup. Mustard. Meat flavorings and tenderizers. Bouillon cubes. Hot sauces. Pre-made or packaged marinades. Pre-made or packaged taco seasonings. Relishes. Regular salad dressings. Other foods Salted popcorn and pretzels. The items listed above may not be a complete list of foods and beverages you should avoid. Contact a dietitian for more information. Where to find more information  National Heart, Lung, and Blood Institute: https://wilson-eaton.com/  American Heart Association: www.heart.org  Academy of Nutrition and Dietetics: www.eatright.[place]: www.kidney.org Summary  The DASH eating plan is a healthy eating plan that has been shown to reduce high blood pressure (hypertension). It may also reduce your risk for type 2 diabetes, heart disease, and stroke.  When on the DASH eating plan, aim to eat more fresh fruits and vegetables, whole grains, lean proteins, low-fat dairy, and heart-healthy fats.  With the DASH eating plan, you should limit salt (sodium) intake to 2,300 mg a day. If you have hypertension, you may  need to reduce your sodium intake to 1,500 mg a day.  Work with your health care provider or dietitian to adjust your eating plan to your individual calorie needs. This information is not intended to replace advice given to you by your health care provider. Make sure you discuss any questions you have with your health care provider. Document Revised: 12/20/2018 Document Reviewed: 12/20/2018 Elsevier Patient Education  2021 [employer].

## 2020-04-07 NOTE — Progress Notes (Signed)
Patient ID: Erik [name], male    DOB: [birth year]  MRN: [id number]  CC: Cough  Subjective: Erik [name] is a 55 y.o. male who presents for cough. States cough is lingering. Has used [employer] without relief. Doing well with tracheostomy. Reports he is cleaning site about every two hours. Denies shortness of breath or chest pain. Reports he is still being followed by both Oncology and Pulmonology with appointments scheduled to see them soon.  Patient Active Problem List   Diagnosis Date Noted  . 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 Tracheostomy status ([place])   . Malnutrition of moderate degree 03/05/2020  . 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 ibuprofen (ADVIL,MOTRIN) 200 MG tablet Take 800 mg by mouth every 8 (eight) hours as needed for mild pain. For pain    . Multiple Vitamin (MULTIVITAMIN WITH MINERALS) TABS tablet Take 1 tablet by mouth daily.    Marland Kitchen albuterol (VENTOLIN HFA) 108 (90 Base) MCG/ACT inhaler Inhale 2 puffs into the lungs every 6 (six) hours as needed for wheezing or shortness of breath. (Patient not taking: Reported on 04/07/2020) 8 g 0  . benzonatate ([name]) 100 MG capsule Take 1 capsule (100 mg total) by mouth 3 (three) times daily as needed for cough. 20 capsule 0  . cholecalciferol (VITAMIN D3) 25 MCG (1000 UNIT) tablet Take 1,000 Units by mouth daily.    Marland Kitchen omeprazole (PRILOSEC) 20 MG capsule Take 1 capsule (20 mg total) by mouth 2 (two) times daily before a meal. (Patient taking differently: Take 20 mg by mouth daily.) 60 capsule 1   No current facility-administered medications on file prior to visit.    No Known Allergies  Social History   Socioeconomic History  . Marital status: Married    Spouse name: Not on file  . Number of children: Not on file  . Years of education: Not on file  . Highest education level: Not on file  Occupational  History  . Not on file  Tobacco Use  . Smoking status: Current Every Day Smoker    Types: Cigarettes  . Smokeless tobacco: Former Systems developer    Types: Secondary school teacher  . Vaping Use: Former  Substance and Sexual Activity  . Alcohol use: No  . Drug use: No  . Sexual activity: Yes  Other Topics Concern  . Not on file  Social History Narrative  . Not on file   Social Determinants of Health   Financial Resource Strain: Not on file  Food Insecurity: Not on file  Transportation Needs: No Transportation Needs  . Lack of Transportation (Medical): No  . Lack of Transportation (Non-Medical): No  Physical Activity: Not on file  Stress: Not on file  Social Connections: Not on file  Intimate Partner Violence: Not on file    History reviewed. No pertinent family history.  Past Surgical History:  Procedure Laterality Date  . CARDIAC CATHETERIZATION     with stent placement  . TONSILLECTOMY    . TRACHEOSTOMY TUBE PLACEMENT N/A 03/05/2020   Procedure: AWAKE TRACHEOSTOMY, DIRECT LARYNGOSCOPY WITH BIOPSY;  Surgeon: Melida Quitter, MD;  Location: [facility];  Service: ENT;  Laterality: N/A;    ROS: Review of Systems Negative except as stated above  PHYSICAL EXAM: BP (!) 146/88 (BP Location: Left Arm, Patient Position: Sitting)   Pulse 82   Wt 124 lb 12.8 oz (  56.6 kg)   SpO2 96%   BMI 18.98 kg/m   Physical Exam Constitutional:      Appearance: He is normal weight.  HENT:     Head: Normocephalic and atraumatic.  Neck:     Trachea: Tracheostomy present.  Cardiovascular:     Rate and Rhythm: Normal rate and regular rhythm.     Pulses: Normal pulses.     Heart sounds: Normal heart sounds.  Pulmonary:     Effort: Pulmonary effort is normal.     Breath sounds: Normal breath sounds.  Musculoskeletal:     Cervical back: Normal range of motion and neck supple.  Neurological:     General: No focal deficit present.     Mental Status: He is alert and oriented to person, place, and time.      ASSESSMENT AND PLAN: 1. 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 Cough: - Patient stable and without cardiac distress and respiratory distress during today's visit.  - Lingering cough likely related to carcinoma and drainage of secretions from tracheostomy.  - Keep all appointments with Pulmonology and Oncology.   Patient was given the opportunity to ask questions.  Patient verbalized understanding of the plan and was able to repeat key elements of the plan. Patient was given clear instructions to go to Emergency Department or return to medical center if symptoms don't improve, worsen, or new problems develop.The patient verbalized understanding.   Requested Prescriptions    No prescriptions requested or ordered in this encounter   Ritamarie Arkin [name], NP

## 2020-04-08 ENCOUNTER — Telehealth: Payer: Self-pay | Admitting: Hematology and Oncology

## 2020-04-08 NOTE — Telephone Encounter (Signed)
Scheduled appointments per 3/8 los. Spoke to patient who is aware of appointments dates and times. First tx is scheduled in [place], patient is aware. Will have updated calendar printed for patient at next visit.

## 2020-04-09 ENCOUNTER — Telehealth: Payer: Self-pay | Admitting: *Deleted

## 2020-04-09 NOTE — Telephone Encounter (Signed)
-----  Message from Peter E Babcock, NP sent at 04/09/2020 10:09 AM EST ----- Regarding: DME follow up Hey guys  I'm pretty sure I signed these orders last week but the wife has called and is saying has not gotten any supplies.  Can you please harass [facility] for me?  Incase they need another order what I wanted was:   # 30-d supply trach care Kit  # 30 d supply 10 fr suction catheter  # 30 trach drain dressing  # 4 trach tube collar/holder  # 4 HME device  # 1 suction machine   Reflill PRN   He is followed by [facility]  Also needs cuffless shiley model [id number]    Thanks  Pete    

## 2020-04-09 NOTE — Telephone Encounter (Signed)
Sent community message to [facility] at [facility] because order was placed and received by [facility]   Type Date User Summary Attachment  General 04/02/2020 4:45 PM Harland German - -  Note   Waymon Budge, [place]; Skeet Latch; [place], [place]   received       Previous Messages   ----- Message -----  From: Harland German  Sent: 04/02/2020  2:50 PM EST  To: Darlina Guys, Skeet Latch, Beverly Samples  Subject: [facility] supplies                  August 14, 1965  Order placed please advise. Thanks Chantel           . Type Date User Summary Attachment  General 04/02/2020 2:51 PM Harland German - -  Note   Cm sent to [facility]         . Type Date User Summary Attachment  General 04/01/2020 4:07 PM Harland German - -  Note   Waiting on signature         . Type Date User Summary Attachment  Provider Comments 04/01/2020 3:53 PM Vivia Ewing, LPN Provider Comments -  Note   DME [facility] Trach supplies:  # 30-d supply trach care Kit  # 30 d supply 10 fr suction catheter  # 30 trach drain dressing  # 4 trach tube collar/holder  # 4 HME device  # 1 suction machine   Also needs cuffless shiley model [id number]   Refill PRN           Waiting for a community message back from [place] at [facility]

## 2020-04-12 ENCOUNTER — Telehealth: Payer: Self-pay

## 2020-04-12 NOTE — Telephone Encounter (Signed)
Call made to [facility], placed on hold for 28 minutes then someone picked up the phone and hung up the phone.   Call made back to [facility] spoke with Leann, the order was processed and should ship out today.

## 2020-04-12 NOTE — Telephone Encounter (Signed)
-----  Message from Peter E Babcock, NP sent at 04/09/2020 10:09 AM EST ----- Regarding: DME follow up Hey guys  I'm pretty sure I signed these orders last week but the wife has called and is saying has not gotten any supplies.  Can you please harass [facility] for me?  Incase they need another order what I wanted was:   # 30-d supply trach care Kit  # 30 d supply 10 fr suction catheter  # 30 trach drain dressing  # 4 trach tube collar/holder  # 4 HME device  # 1 suction machine   Reflill PRN   He is followed by [facility]  Also needs cuffless shiley model [id number]    Thanks  Pete    

## 2020-04-13 ENCOUNTER — Other Ambulatory Visit: Payer: Self-pay

## 2020-04-13 ENCOUNTER — Inpatient Hospital Stay: Payer: 59

## 2020-04-13 ENCOUNTER — Other Ambulatory Visit: Payer: Self-pay | Admitting: Hematology and Oncology

## 2020-04-13 ENCOUNTER — Encounter: Payer: Self-pay | Admitting: Hematology and Oncology

## 2020-04-13 ENCOUNTER — Inpatient Hospital Stay (HOSPITAL_BASED_OUTPATIENT_CLINIC_OR_DEPARTMENT_OTHER): Payer: 59 | Admitting: Hematology and Oncology

## 2020-04-13 VITALS — BP 122/74 | HR 74 | Temp 97.9°F | Resp 20 | Wt 125.4 lb

## 2020-04-13 DIAGNOSIS — C321 Malignant neoplasm of supraglottis: Secondary | ICD-10-CM

## 2020-04-13 DIAGNOSIS — Z23 Encounter for immunization: Secondary | ICD-10-CM

## 2020-04-13 LAB — CBC WITH DIFFERENTIAL/PLATELET
Abs Immature Granulocytes: 0.04 10*3/uL (ref 0.00–0.07)
Basophils Absolute: 0 10*3/uL (ref 0.0–0.1)
Basophils Relative: 0 %
Eosinophils Absolute: 0.3 10*3/uL (ref 0.0–0.5)
Eosinophils Relative: 3 %
HCT: 39 % (ref 39.0–52.0)
Hemoglobin: 13.1 g/dL (ref 13.0–17.0)
Immature Granulocytes: 0 %
Lymphocytes Relative: 16 %
Lymphs Abs: 1.8 10*3/uL (ref 0.7–4.0)
MCH: 30.3 pg (ref 26.0–34.0)
MCHC: 33.6 g/dL (ref 30.0–36.0)
MCV: 90.1 fL (ref 80.0–100.0)
Monocytes Absolute: 0.8 10*3/uL (ref 0.1–1.0)
Monocytes Relative: 7 %
Neutro Abs: 8.4 10*3/uL — ABNORMAL HIGH (ref 1.7–7.7)
Neutrophils Relative %: 74 %
Platelets: 382 10*3/uL (ref 150–400)
RBC: 4.33 MIL/uL (ref 4.22–5.81)
RDW: 14.1 % (ref 11.5–15.5)
WBC: 11.4 10*3/uL — ABNORMAL HIGH (ref 4.0–10.5)
nRBC: 0 % (ref 0.0–0.2)

## 2020-04-13 LAB — BASIC METABOLIC PANEL
Anion gap: 8 (ref 5–15)
BUN: 18 mg/dL (ref 6–20)
CO2: 30 mmol/L (ref 22–32)
Calcium: 9.7 mg/dL (ref 8.9–10.3)
Chloride: 101 mmol/L (ref 98–111)
Creatinine, Ser: 0.9 mg/dL (ref 0.61–1.24)
GFR, Estimated: >60 mL/min (ref >60)
Glucose, Bld: 108 mg/dL — ABNORMAL HIGH (ref 70–99)
Potassium: 4.2 mmol/L (ref 3.5–5.1)
Sodium: 139 mmol/L (ref 135–145)

## 2020-04-13 LAB — MAGNESIUM: Magnesium: 1.7 mg/dL (ref 1.7–2.4)

## 2020-04-13 MED ORDER — LORAZEPAM 0.5 MG PO TABS: 0.5000 mg | ORAL_TABLET | Freq: Four times a day (QID) | ORAL | 0 refills | Status: DC | PRN

## 2020-04-13 MED ORDER — ONDANSETRON HCL 8 MG PO TABS: 8.0000 mg | ORAL_TABLET | Freq: Two times a day (BID) | ORAL | 1 refills | Status: DC | PRN

## 2020-04-13 MED ORDER — DEXAMETHASONE 4 MG PO TABS: 8.0000 mg | ORAL_TABLET | Freq: Every day | ORAL | 1 refills | Status: DC

## 2020-04-13 MED ORDER — PROCHLORPERAZINE MALEATE 10 MG PO TABS: 10.0000 mg | ORAL_TABLET | Freq: Four times a day (QID) | ORAL | 1 refills | Status: DC | PRN

## 2020-04-13 MED ORDER — LIDOCAINE-PRILOCAINE 2.5-2.5 % EX CREA: TOPICAL_CREAM | CUTANEOUS | 3 refills | Status: DC

## 2020-04-13 NOTE — Progress Notes (Signed)
   Covid-19 Vaccination Clinic  Name:  Chadley Dziedzic    MRN: [id number] DOB: 03-12-1965  04/13/2020  Mr. Timberman was observed post Covid-19 immunization for 15 minutes without incident. He was provided with Vaccine Information Sheet and instruction to access the V-Safe system.   Mr. Bagshaw was instructed to call 911 with any severe reactions post vaccine: Marland Kitchen Difficulty breathing  . Swelling of face and throat  . A fast heartbeat  . A bad rash all over body  . Dizziness and weakness   Immunizations Administered    Name Date Dose VIS Date Route   PFIZER Comrnaty(Gray TOP) Covid-19 Vaccine 04/13/2020  3:30 PM 0.3 mL 01/08/2020 Intramuscular   Manufacturer: [employer]   Lot: [id number]   NDC: [phone]

## 2020-04-13 NOTE — Progress Notes (Signed)
[facility] CONSULT NOTE  Patient Care Team: [name], NP as PCP - General (Nurse Practitioner)  CHIEF COMPLAINTS/PURPOSE OF CONSULTATION:  Laryngeal cancer.  ASSESSMENT & PLAN:  No problem-specific Assessment & Plan notes found for this encounter.  No orders of the defined types were placed in this encounter.  1. SCC of larynx involving both VC,T2N1M0  PET staging didn't show definitive evidence of metastatic disease. He doesn't want to proceed with surgery given his hope for voice preservation He understands that if cRT doesn't provide him with complete response, he may still need salvage surgery We agree that he is a reasonable candidate for concurrent chemoradiation given his young age and decent performance status.  We have previously discussed about this regimen. We also discussed about the adverse effects in detail last time.  He has his chemoradiation scheduled today. His baseline labs appear satisfactory to proceed with chemotherapy. First treatment anticipated on 04/15/2020.  Subsequent cycles will be on Wednesdays given scheduling issues.  2.  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  He will meet our nutritionist after G-tube placement.  3.  Nicotine abuse, strongly encourage smoking cessation  He is agreeable to proceed with all the recommendations. He has FOLLOW-up scheduled.  HISTORY OF PRESENTING ILLNESS:  Erik [name] 55 y.o. male is here because of new diagnosis of laryngeal cancer  Chronology  Pt had noticed ongoing hoarseness for almost 6 months, and was seen by a doctor who suggested that it may be related to GERD He used medication for GERD but his hoarseness continued to worsen. He then started getting SOB and went to the ER. He was found to have airway obstruction and underwent elective tracheostomy and direct laryngoscopy with biopsy.  Pertinent imaging and pathology results so far.  03/04/2020  Mucosal  irregularity and mucosal/submucosal edema of the supraglottic and glottic larynx. Primary differential considerations include infectious/inflammatory laryngeal edema versus laryngeal malignancy. ENT consultation and direct visualization recommended. Associated mild-to-moderate narrowing, and mild rightward deviation, of the airway at this level.  Additional edema within the cutaneous/subcutaneous ventral neck.  Mildly enlarged left level 2/3 lymph node which may be reactive or could reflect metastatic nodal disease.  Nonspecific 1.4 cm part cystic/part solid cutaneous/subcutaneous lesion within the midline upper neck. Direct visualization recommended.  03/05/2020  FINAL MICROSCOPIC DIAGNOSIS:   A. LEFT VOCAL CORD, EXCISION:  - Invasive moderately differentiated squamous cell carcinoma, see  comment   B. RIGHT VOCAL CORD, EXCISION:  - Invasive moderately differentiated squamous cell carcinoma, see  comment   C. LEFT FALSE VOCAL CORD, EXCISION:  - Invasive moderately differentiated squamous cell carcinoma, see  comment   P 16 positive  INTERVAL HISTORY  Erik [name] arrived to the appointment today with his wife. Again continues to have some mucus from the trach site which keeps him awake at night.  He was given the distant water nebulizer which did not work very well for him he also has malfunction which he uses periodically.  Besides the drainage from the trach and baseline shortness of breath, he denies any discomfort in his neck.  No change in bowel habits or urinary habits.  He will be getting his G-tube placed next week and has to do Covid testing prior to the procedure.  REVIEW OF SYSTEMS:   Constitutional: Denies fevers, chills or abnormal night sweats Eyes: Denies blurriness of vision, double vision or watery eyes Ears, nose, mouth, throat, and face: As mentioned above respiratory: Denies  cough, dyspnea or wheezes Cardiovascular: Denies palpitation, chest discomfort  or lower extremity swelling Gastrointestinal:  Denies nausea, heartburn or change in bowel habits Skin: Denies abnormal skin rashes Lymphatics: Denies new lymphadenopathy or easy bruising Neurological:Denies numbness, tingling or new weaknesses Behavioral/Psych: Mood is stable, no new changes  All other systems were reviewed with the patient and are negative.  MEDICAL HISTORY:  Past Medical History:  Diagnosis Date  . Coronary artery disease   . Dyspnea   . GERD (gastroesophageal reflux disease)    [employer]rees[employer] 02/21/2020  . Ruptured lumbar intervertebral disc     SURGICAL HISTORY: Past Surgical History:  Procedure Laterality Date  . CARDIAC CATHETERIZATION     with stent placement  . TONSILLECTOMY    . TRACHEOSTOMY TUBE PLACEMENT N/A 03/05/2020   Procedure: AWAKE TRACHEOSTOMY, DIRECT LARYNGOSCOPY WITH BIOPSY;  Surgeon: Melida Quitter, MD;  Location: [facility];  Service: ENT;  Laterality: N/A;    SOCIAL HISTORY: Social History   Socioeconomic History  . Marital status: Married    Spouse name: Not on file  . Number of children: Not on file  . Years of education: Not on file  . Highest education level: Not on file  Occupational History  . Not on file  Tobacco Use  . Smoking status: Current Every Day Smoker    Types: Cigarettes  . Smokeless tobacco: Former Systems developer    Types: Secondary school teacher  . Vaping Use: Former  Substance and Sexual Activity  . Alcohol use: No  . Drug use: No  . Sexual activity: Yes  Other Topics Concern  . Not on file  Social History Narrative  . Not on file   Social Determinants of Health   Financial Resource Strain: Not on file  Food Insecurity: Not on file  Transportation Needs: No Transportation Needs  . Lack of Transportation (Medical): No  . Lack of Transportation (Non-Medical): No  Physical Activity: Not on file  Stress: Not on file  Social Connections: Not on file  Intimate Partner Violence: Not on file    FAMILY HISTORY: No family  history on file.  ALLERGIES:  has No Known Allergies.  MEDICATIONS:  Current Outpatient Medications  Medication Sig Dispense Refill  . albuterol (VENTOLIN HFA) 108 (90 Base) MCG/ACT inhaler Inhale 2 puffs into the lungs every 6 (six) hours as needed for wheezing or shortness of breath. (Patient not taking: Reported on 04/07/2020) 8 g 0  . benzonatate ([name]) 100 MG capsule Take 1 capsule (100 mg total) by mouth 3 (three) times daily as needed for cough. 20 capsule 0  . cholecalciferol (VITAMIN D3) 25 MCG (1000 UNIT) tablet Take 1,000 Units by mouth daily.    Marland Kitchen dexamethasone (DECADRON) 4 MG tablet Take 2 tablets (8 mg total) by mouth daily. Take daily x 3 days starting the day after cisplatin chemotherapy. Take with food. 30 tablet 1  . ibuprofen (ADVIL,MOTRIN) 200 MG tablet Take 800 mg by mouth every 8 (eight) hours as needed for mild pain. For pain    . lidocaine-prilocaine (EMLA) cream Apply to affected area once 30 g 3  . LORazepam (ATIVAN) 0.5 MG tablet Take 1 tablet (0.5 mg total) by mouth every 6 (six) hours as needed (Nausea or vomiting). 30 tablet 0  . Multiple Vitamin (MULTIVITAMIN WITH MINERALS) TABS tablet Take 1 tablet by mouth daily.    Marland Kitchen omeprazole (PRILOSEC) 20 MG capsule Take 1 capsule (20 mg total) by mouth 2 (two) times daily before a meal. (Patient  taking differently: Take 20 mg by mouth daily.) 60 capsule 1  . ondansetron (ZOFRAN) 8 MG tablet Take 1 tablet (8 mg total) by mouth 2 (two) times daily as needed. Start on the third day after cisplatin chemotherapy. 30 tablet 1  . prochlorperazine (COMPAZINE) 10 MG tablet Take 1 tablet (10 mg total) by mouth every 6 (six) hours as needed (Nausea or vomiting). 30 tablet 1   No current facility-administered medications for this visit.     PHYSICAL EXAMINATION:  ECOG PERFORMANCE STATUS: 0 - Asymptomatic  Vitals:   04/13/20 1520  BP: 122/74  Pulse: 74  Resp: 20  Temp: 97.9 F (36.6 C)  SpO2: 99%   Filed Weights    04/13/20 1520  Weight: 125 lb 6.4 oz (56.9 kg)    GENERAL:alert, no distress and comfortable NECK:  Tracheostomy in place, ongoing cough.  Slight redness around the trach site without any overt signs of infection. LYMPH: No obvious palpable lymphadenopathy LUNGS: clear to auscultation and percussion with normal breathing effort HEART: regular rate & rhythm and no murmurs and no lower extremity edema ABDOMEN:abdomen soft, non-tender and normal bowel sounds Musculoskeletal:no cyanosis of digits and no clubbing  PSYCH: alert & oriented x 3 with fluent speech NEURO: no focal motor/sensory deficits  LABORATORY DATA:  I have reviewed the data as listed Lab Results  Component Value Date   WBC 11.4 (H) 04/13/2020   HGB 13.1 04/13/2020   HCT 39.0 04/13/2020   MCV 90.1 04/13/2020   PLT 382 04/13/2020     Chemistry      Component Value Date/Time   NA 139 04/13/2020 1446   K 4.2 04/13/2020 1446   CL 101 04/13/2020 1446   CO2 30 04/13/2020 1446   BUN 18 04/13/2020 1446   CREATININE 0.90 04/13/2020 1446   CREATININE 0.79 03/23/2020 1013      Component Value Date/Time   CALCIUM 9.7 04/13/2020 1446   ALKPHOS 88 03/23/2020 1013   AST 24 03/23/2020 1013   ALT 41 03/23/2020 1013   BILITOT 0.3 03/23/2020 1013     CBC reviewed, mild leukocytosis. BMP reviewed, no evidence of creatinine impairment Magnesium of 1.7.  RADIOGRAPHIC STUDIES: I have personally reviewed the radiological images as listed and agreed with the findings in the report. NM PET Image Initial (PI) Skull Base To Thigh  Result Date: 04/05/2020 CLINICAL DATA:  Initial treatment strategy for head neck cancer. EXAM: NUCLEAR MEDICINE PET SKULL BASE TO THIGH TECHNIQUE: 6.0 mCi F-18 FDG was injected intravenously. Full-ring PET imaging was performed from the skull base to thigh after the radiotracer. CT data was obtained and used for attenuation correction and anatomic localization. Fasting blood glucose: 82 mg/dl  COMPARISON:  Neck CT 03/04/2020 FINDINGS: Mediastinal blood pool activity: SUV max 1.8 Liver activity: SUV max NA NECK: Marked hypermetabolism is identified in the glottic region, compatible with the patient's known primary. SUV max = 17.3. The left-sided level II lymph node identified on diagnostic neck CT shows low level hypermetabolism with SUV max = 2.0. Activity along the patient's tracheostomy tube is compatible with uptake related to scarring/granulation. The 1.4 cm part cystic/part solid cutaneous/subcutaneous lesion described in the midline posterior upper neck on previous CT shows no hypermetabolism. Incidental CT findings: none CHEST: 9 mm ground-glass opacity in the posterior right upper lobe (84/4) shows low level FDG uptake with SUV max = 1.1. No hypermetabolic lymphadenopathy in the chest. Incidental CT findings: Coronary artery calcification is evident. Atherosclerotic calcification is noted in the  wall of the thoracic aorta. Centrilobular emphysema noted. ABDOMEN/PELVIS: No abnormal hypermetabolic activity within the liver, pancreas, adrenal glands, or spleen. No hypermetabolic lymph nodes in the abdomen or pelvis. Isolated focus of uptake anteromedial to the left psoas muscle is likely excreted radiotracer within the ureter. Incidental CT findings: There is abdominal aortic atherosclerosis without aneurysm. SKELETON: No focal hypermetabolic activity to suggest skeletal metastasis. Incidental CT findings: none IMPRESSION: 1. Marked hypermetabolism in the glottic region, consistent with the patient's known primary neoplasm. 2. Left-sided level II lymph node described on previous diagnostic neck CT shows low level FDG accumulation, raising concern for metastatic involvement. 3. No evidence for hypermetabolic metastatic disease in the chest, abdomen, or pelvis. 4. 9 mm ground-glass opacity in the posterior right upper lobe with low level FDG uptake. This may be infectious/inflammatory. Continued  attention on follow-up recommended. 5.  Aortic Atherosclerois (ICD10-170.0) 6.  Emphysema. ([id number]) Electronically Signed   By: Misty Stanley M.D.   On: 04/05/2020 15:18    All questions were answered. The patient knows to call the clinic with any problems, questions or concerns. I spent 30 minutes in the care of this patient including H and P, review of records, counseling and coordination of care.     Benay Pike, MD 04/13/2020 3:45 PM

## 2020-04-14 ENCOUNTER — Ambulatory Visit
Admission: RE | Admit: 2020-04-14 | Discharge: 2020-04-14 | Disposition: A | Discharge status: Home / Self Care | Payer: 59 | Source: Ambulatory Visit | Attending: Radiation Oncology | Admitting: Radiation Oncology

## 2020-04-14 ENCOUNTER — Telehealth: Payer: Self-pay | Admitting: Hematology and Oncology

## 2020-04-14 DIAGNOSIS — C321 Malignant neoplasm of supraglottis: Secondary | ICD-10-CM | POA: Diagnosis not present

## 2020-04-14 NOTE — Progress Notes (Signed)
Oncology Nurse Navigator Documentation  To provide support, encouragement and care continuity, met with Mr. Erik [name] for his initial RT.  He was accompanied by his wife.  I reviewed the 2-step treatment process, answered questions.   Mr. Erik [name]  completed treatment without difficulty, denied questions/concerns.  I reviewed the registration/arrival procedure for subsequent treatments.  I encouraged them to call me with questions/concerns as tmts proceed.  I also provided PEG education prior to 04/20/20 placement.  Provided port educational handout, showed example, provided guidance for post-surgical dsg removal, site care.  . Using  PEG teaching device   and Teach Back, provided education for PEG use and care, including: hand hygiene, gravity bolus administration of daily water flushes and nutritional supplement, fluids and medications; care of tube insertion site including daily dressing change and cleaning; S&S of infection.   . Mr. Erik [name] correctly verbalized procedures for and provided correct return demonstration of gravity administration of water, dressing change and site care.  . I provided written instructions for PEG flushing/dressing change in support of verbal instruction.   . I provided/described contents of Start of Care Bolus Feeding Kit (3 60 cc syringes, 2 boxes 4x4 drainage sponges, 1 package mesh briefs, 1 roll paper tape, 1 case Osmolite 1.5).  He voiced understanding he is to start using Osmolite per guidance of Nutrition. Marland Kitchen He understands I will be available for ongoing PEG support. Provided barium sulfate prep which I obtained from [facility], reviewed instructions which included guidance for 04/16/20 COVID screening at [address].   Harlow Asa RN, BSN, OCN Head & Neck Oncology Nurse [facility] Phone # [phone]  Fax # [phone]

## 2020-04-14 NOTE — Telephone Encounter (Signed)
No new appointments scheduled. No 3/15 LOS entered.

## 2020-04-15 ENCOUNTER — Ambulatory Visit
Admission: RE | Admit: 2020-04-15 | Discharge: 2020-04-15 | Disposition: A | Discharge status: Home / Self Care | Payer: 59 | Source: Ambulatory Visit | Attending: Radiation Oncology | Admitting: Radiation Oncology

## 2020-04-15 ENCOUNTER — Telehealth: Payer: Self-pay

## 2020-04-15 ENCOUNTER — Other Ambulatory Visit: Payer: Self-pay

## 2020-04-15 ENCOUNTER — Inpatient Hospital Stay: Payer: 59

## 2020-04-15 VITALS — BP 136/80 | HR 80 | Temp 98.7°F | Resp 20

## 2020-04-15 DIAGNOSIS — C321 Malignant neoplasm of supraglottis: Secondary | ICD-10-CM | POA: Diagnosis not present

## 2020-04-15 MED ORDER — SODIUM CHLORIDE 0.9 % IV SOLN
10.0000 mg | Freq: Once | INTRAVENOUS | Status: AC
  Administered 2020-04-15: 10 mg via INTRAVENOUS
  Filled 2020-04-15: qty 10

## 2020-04-15 MED ORDER — SODIUM CHLORIDE 0.9 % IV SOLN
Freq: Once | INTRAVENOUS | Status: DC
  Filled 2020-04-15: qty 10

## 2020-04-15 MED ORDER — SODIUM CHLORIDE 0.9 % IV SOLN
Freq: Once | INTRAVENOUS | Status: AC
  Filled 2020-04-15: qty 250

## 2020-04-15 MED ORDER — PALONOSETRON HCL INJECTION 0.25 MG/5ML
INTRAVENOUS | Status: AC
  Filled 2020-04-15: qty 5

## 2020-04-15 MED ORDER — POTASSIUM CHLORIDE IN NACL 20-0.9 MEQ/L-% IV SOLN
Freq: Once | INTRAVENOUS | Status: AC
  Filled 2020-04-15: qty 1000

## 2020-04-15 MED ORDER — SODIUM CHLORIDE 0.9 % IV SOLN
40.0000 mg/m2 | Freq: Once | INTRAVENOUS | Status: AC
  Administered 2020-04-15: 66 mg via INTRAVENOUS
  Filled 2020-04-15: qty 66

## 2020-04-15 MED ORDER — MAGNESIUM SULFATE 2 GM/50ML IV SOLN
2.0000 g | Freq: Once | INTRAVENOUS | Status: AC
  Administered 2020-04-15: 2 g via INTRAVENOUS
  Filled 2020-04-15: qty 50

## 2020-04-15 MED ORDER — SODIUM CHLORIDE 0.9 % IV SOLN
150.0000 mg | Freq: Once | INTRAVENOUS | Status: AC
  Administered 2020-04-15: 150 mg via INTRAVENOUS
  Filled 2020-04-15: qty 150

## 2020-04-15 MED ORDER — PALONOSETRON HCL INJECTION 0.25 MG/5ML
0.2500 mg | Freq: Once | INTRAVENOUS | Status: AC
  Administered 2020-04-15: 0.25 mg via INTRAVENOUS

## 2020-04-15 NOTE — Patient Instructions (Signed)
[facility] Discharge Instructions for Patients Receiving Chemotherapy  Today you received the following chemotherapy agents Cisplatin.  To help prevent nausea and vomiting after your treatment, we encourage you to take your nausea medication as indicated by your MD.   If you develop nausea and vomiting that is not controlled by your nausea medication, call the clinic.   BELOW ARE SYMPTOMS THAT SHOULD BE REPORTED IMMEDIATELY:  *FEVER GREATER THAN 100.5 F  *CHILLS WITH OR WITHOUT FEVER  NAUSEA AND VOMITING THAT IS NOT CONTROLLED WITH YOUR NAUSEA MEDICATION  *UNUSUAL SHORTNESS OF BREATH  *UNUSUAL BRUISING OR BLEEDING  TENDERNESS IN MOUTH AND THROAT WITH OR WITHOUT PRESENCE OF ULCERS  *URINARY PROBLEMS  *BOWEL PROBLEMS  UNUSUAL RASH Items with * indicate a potential emergency and should be followed up as soon as possible.  Feel free to call the clinic should you have any questions or concerns. The clinic phone number is [phone].  Please show the CHEMO ALERT CARD at check-in to the Emergency Department and triage nurse.   

## 2020-04-15 NOTE — Telephone Encounter (Signed)
Per Dr. Chryl Heck it is ok to run post hydration fluids with Cisplatin today.

## 2020-04-16 ENCOUNTER — Other Ambulatory Visit: Payer: Self-pay

## 2020-04-16 ENCOUNTER — Ambulatory Visit
Admission: RE | Admit: 2020-04-16 | Discharge: 2020-04-16 | Disposition: A | Discharge status: Home / Self Care | Payer: 59 | Source: Ambulatory Visit | Attending: Radiation Oncology | Admitting: Radiation Oncology

## 2020-04-16 ENCOUNTER — Other Ambulatory Visit (HOSPITAL_COMMUNITY)
Admission: RE | Admit: 2020-04-16 | Discharge: 2020-04-16 | Disposition: A | Discharge status: Home / Self Care | Payer: 59 | Source: Ambulatory Visit | Attending: Hematology and Oncology | Admitting: Hematology and Oncology

## 2020-04-16 DIAGNOSIS — Z20822 Contact with and (suspected) exposure to covid-19: Secondary | ICD-10-CM | POA: Diagnosis not present

## 2020-04-16 DIAGNOSIS — Z01812 Encounter for preprocedural laboratory examination: Secondary | ICD-10-CM | POA: Diagnosis not present

## 2020-04-16 DIAGNOSIS — C321 Malignant neoplasm of supraglottis: Secondary | ICD-10-CM | POA: Diagnosis not present

## 2020-04-16 LAB — SARS CORONAVIRUS 2 (TAT 6-24 HRS): SARS Coronavirus 2: NEGATIVE

## 2020-04-18 ENCOUNTER — Other Ambulatory Visit: Payer: Self-pay | Admitting: Radiology

## 2020-04-19 ENCOUNTER — Other Ambulatory Visit: Payer: Self-pay

## 2020-04-19 ENCOUNTER — Ambulatory Visit
Admission: RE | Admit: 2020-04-19 | Discharge: 2020-04-19 | Disposition: A | Discharge status: Home / Self Care | Payer: 59 | Source: Ambulatory Visit | Attending: Radiation Oncology | Admitting: Radiation Oncology

## 2020-04-19 DIAGNOSIS — C321 Malignant neoplasm of supraglottis: Secondary | ICD-10-CM | POA: Diagnosis not present

## 2020-04-19 MED ORDER — SONAFINE EX EMUL
1.0000 "application " | Freq: Two times a day (BID) | CUTANEOUS | Status: DC
  Administered 2020-04-19: 1 via TOPICAL

## 2020-04-19 NOTE — Progress Notes (Signed)

## 2020-04-20 ENCOUNTER — Other Ambulatory Visit: Payer: Self-pay | Admitting: Hematology and Oncology

## 2020-04-20 ENCOUNTER — Ambulatory Visit (HOSPITAL_COMMUNITY)
Admission: RE | Admit: 2020-04-20 | Discharge: 2020-04-20 | Disposition: A | Discharge status: Home / Self Care | Payer: 59 | Source: Ambulatory Visit | Attending: Internal Medicine | Admitting: Internal Medicine

## 2020-04-20 ENCOUNTER — Ambulatory Visit
Admission: RE | Admit: 2020-04-20 | Discharge: 2020-04-20 | Disposition: A | Discharge status: Home / Self Care | Payer: 59 | Source: Ambulatory Visit | Attending: Radiation Oncology | Admitting: Radiation Oncology

## 2020-04-20 ENCOUNTER — Ambulatory Visit (HOSPITAL_COMMUNITY)
Admission: RE | Admit: 2020-04-20 | Discharge: 2020-04-20 | Disposition: A | Discharge status: Home / Self Care | Payer: 59 | Source: Ambulatory Visit | Attending: Hematology and Oncology | Admitting: Hematology and Oncology

## 2020-04-20 ENCOUNTER — Other Ambulatory Visit: Payer: Self-pay

## 2020-04-20 ENCOUNTER — Other Ambulatory Visit (HOSPITAL_COMMUNITY): Payer: Self-pay | Admitting: Interventional Radiology

## 2020-04-20 ENCOUNTER — Encounter (HOSPITAL_COMMUNITY): Payer: Self-pay

## 2020-04-20 DIAGNOSIS — Z955 Presence of coronary angioplasty implant and graft: Secondary | ICD-10-CM | POA: Diagnosis not present

## 2020-04-20 DIAGNOSIS — C329 Malignant neoplasm of larynx, unspecified: Secondary | ICD-10-CM | POA: Diagnosis present

## 2020-04-20 DIAGNOSIS — F17210 Nicotine dependence, cigarettes, uncomplicated: Secondary | ICD-10-CM | POA: Diagnosis not present

## 2020-04-20 DIAGNOSIS — K219 Gastro-esophageal reflux disease without esophagitis: Secondary | ICD-10-CM | POA: Insufficient documentation

## 2020-04-20 DIAGNOSIS — C321 Malignant neoplasm of supraglottis: Secondary | ICD-10-CM | POA: Diagnosis not present

## 2020-04-20 DIAGNOSIS — I2510 Atherosclerotic heart disease of native coronary artery without angina pectoris: Secondary | ICD-10-CM | POA: Diagnosis not present

## 2020-04-20 DIAGNOSIS — J449 Chronic obstructive pulmonary disease, unspecified: Secondary | ICD-10-CM | POA: Insufficient documentation

## 2020-04-20 DIAGNOSIS — Z431 Encounter for attention to gastrostomy: Secondary | ICD-10-CM

## 2020-04-20 HISTORY — PX: IR GASTROSTOMY TUBE MOD SED: IMG625

## 2020-04-20 HISTORY — PX: IR IMAGING GUIDED PORT INSERTION: IMG5740

## 2020-04-20 LAB — CBC WITH DIFFERENTIAL/PLATELET
Abs Immature Granulocytes: 0.09 10*3/uL — ABNORMAL HIGH (ref 0.00–0.07)
Basophils Absolute: 0.1 10*3/uL (ref 0.0–0.1)
Basophils Relative: 0 %
Eosinophils Absolute: 0.3 10*3/uL (ref 0.0–0.5)
Eosinophils Relative: 2 %
HCT: 42.7 % (ref 39.0–52.0)
Hemoglobin: 14 g/dL (ref 13.0–17.0)
Immature Granulocytes: 1 %
Lymphocytes Relative: 10 %
Lymphs Abs: 1.5 10*3/uL (ref 0.7–4.0)
MCH: 30.4 pg (ref 26.0–34.0)
MCHC: 32.8 g/dL (ref 30.0–36.0)
MCV: 92.6 fL (ref 80.0–100.0)
Monocytes Absolute: 0.8 10*3/uL (ref 0.1–1.0)
Monocytes Relative: 6 %
Neutro Abs: 11.8 10*3/uL — ABNORMAL HIGH (ref 1.7–7.7)
Neutrophils Relative %: 81 %
Platelets: 428 10*3/uL — ABNORMAL HIGH (ref 150–400)
RBC: 4.61 MIL/uL (ref 4.22–5.81)
RDW: 14.1 % (ref 11.5–15.5)
WBC: 14.5 10*3/uL — ABNORMAL HIGH (ref 4.0–10.5)
nRBC: 0.1 % (ref 0.0–0.2)

## 2020-04-20 LAB — PROTIME-INR
INR: 1 (ref 0.8–1.2)
Prothrombin Time: 12.5 seconds (ref 11.4–15.2)

## 2020-04-20 MED ORDER — LIDOCAINE-EPINEPHRINE 1 %-1:100000 IJ SOLN
INTRAMUSCULAR | Status: AC
  Filled 2020-04-20: qty 1

## 2020-04-20 MED ORDER — DIPHENHYDRAMINE HCL 50 MG/ML IJ SOLN
INTRAMUSCULAR | Status: AC
  Filled 2020-04-20: qty 1

## 2020-04-20 MED ORDER — CEFAZOLIN SODIUM-DEXTROSE 2-4 GM/100ML-% IV SOLN
2.0000 g | INTRAVENOUS | Status: AC
  Administered 2020-04-20: 2 g via INTRAVENOUS

## 2020-04-20 MED ORDER — HEPARIN SOD (PORK) LOCK FLUSH 100 UNIT/ML IV SOLN
INTRAVENOUS | Status: AC
  Filled 2020-04-20: qty 5

## 2020-04-20 MED ORDER — MIDAZOLAM HCL 2 MG/2ML IJ SOLN
INTRAMUSCULAR | Status: AC
  Filled 2020-04-20: qty 6

## 2020-04-20 MED ORDER — IOHEXOL 300 MG/ML  SOLN
50.0000 mL | Freq: Once | INTRAMUSCULAR | Status: AC | PRN
  Administered 2020-04-20: 35 mL

## 2020-04-20 MED ORDER — HEPARIN SOD (PORK) LOCK FLUSH 100 UNIT/ML IV SOLN
INTRAVENOUS | Status: AC | PRN
  Administered 2020-04-20: 500 [IU] via INTRAVENOUS

## 2020-04-20 MED ORDER — CEFAZOLIN SODIUM-DEXTROSE 2-4 GM/100ML-% IV SOLN
INTRAVENOUS | Status: AC
  Filled 2020-04-20: qty 100

## 2020-04-20 MED ORDER — LIDOCAINE VISCOUS HCL 2 % MT SOLN
OROMUCOSAL | Status: AC
  Filled 2020-04-20: qty 15

## 2020-04-20 MED ORDER — GLUCAGON HCL RDNA (DIAGNOSTIC) 1 MG IJ SOLR
INTRAMUSCULAR | Status: AC
  Filled 2020-04-20: qty 1

## 2020-04-20 MED ORDER — SODIUM CHLORIDE 0.9 % IV SOLN: INTRAVENOUS | Status: DC

## 2020-04-20 MED ORDER — DIPHENHYDRAMINE HCL 50 MG/ML IJ SOLN
INTRAMUSCULAR | Status: AC | PRN
  Administered 2020-04-20: 25 mg via INTRAVENOUS

## 2020-04-20 MED ORDER — LIDOCAINE-EPINEPHRINE 1 %-1:100000 IJ SOLN
INTRAMUSCULAR | Status: AC | PRN
  Administered 2020-04-20 (×2): 10 mL

## 2020-04-20 MED ORDER — FENTANYL CITRATE (PF) 100 MCG/2ML IJ SOLN
INTRAMUSCULAR | Status: AC | PRN
  Administered 2020-04-20 (×4): 50 ug via INTRAVENOUS

## 2020-04-20 MED ORDER — MIDAZOLAM HCL 2 MG/2ML IJ SOLN
INTRAMUSCULAR | Status: AC | PRN
  Administered 2020-04-20: 2 mg via INTRAVENOUS
  Administered 2020-04-20 (×4): 1 mg via INTRAVENOUS

## 2020-04-20 MED ORDER — HYDROCODONE-ACETAMINOPHEN 5-325 MG PO TABS: 1.0000 | ORAL_TABLET | ORAL | Status: DC | PRN

## 2020-04-20 MED ORDER — FENTANYL CITRATE (PF) 100 MCG/2ML IJ SOLN
INTRAMUSCULAR | Status: AC
  Filled 2020-04-20: qty 4

## 2020-04-20 NOTE — Sedation Documentation (Signed)
Port completed

## 2020-04-20 NOTE — Progress Notes (Signed)
[name] CONSULT NOTE  Patient Care Team: Erik Herter, NP as PCP - General (Nurse Practitioner)  CHIEF COMPLAINTS/PURPOSE OF CONSULTATION:  Laryngeal cancer.  ASSESSMENT & PLAN:   Malignant neoplasm of supraglottis ([place]) [id number] Updated staging. He declined surgery. 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 If he has incomplete response with chemo radiation he will proceed with salvage surgery. He is now on week 2 of cisplatin No concerning ROS PE no concerns Labs reviewed, ok to proceed with treatment.  Malnutrition of moderate degree This is because of his inability to eat most foods given the Supraglottic tumor. He now has a G tube, has nutrition appointment today. He will continue using G tube for nutrition and we will monitor the weight loss. He can take PRN tylenol BID for the next 5 days for management of pain around the G tube.  Tracheostomy status ([place]) Trach site appear well with some clear sputum No concerns for infection  Nicotine abuse We have discussed about smoking cessation, he understands the need to cut it down but he doesn't feel motivated.  No orders of the defined types were placed in this encounter.  HISTORY OF PRESENTING ILLNESS:  Erik [name] 55 y.o. male is here because of new diagnosis of laryngeal cancer  Chronology  Pt had noticed ongoing hoarseness for almost 6 months, and was seen by a doctor who suggested that it may be related to GERD He used medication for GERD but his hoarseness continued to worsen. He then started getting SOB and went to the ER. He was found to have airway obstruction and underwent elective tracheostomy and direct laryngoscopy with biopsy.  Pertinent imaging and pathology results so far.  03/04/2020  Mucosal irregularity and mucosal/submucosal edema of the supraglottic and glottic  larynx. Primary differential considerations include infectious/inflammatory laryngeal edema versus laryngeal malignancy. ENT consultation and direct visualization recommended. Associated mild-to-moderate narrowing, and mild rightward deviation, of the airway at this level.  Additional edema within the cutaneous/subcutaneous ventral neck.  Mildly enlarged left level 2/3 lymph node which may be reactive or could reflect metastatic nodal disease.  Nonspecific 1.4 cm part cystic/part solid cutaneous/subcutaneous lesion within the midline upper neck. Direct visualization recommended.  03/05/2020  FINAL MICROSCOPIC DIAGNOSIS:   A. LEFT VOCAL CORD, EXCISION:  - Invasive moderately differentiated squamous cell carcinoma, see  comment   B. RIGHT VOCAL CORD, EXCISION:  - Invasive moderately differentiated squamous cell carcinoma, see  comment   C. LEFT FALSE VOCAL CORD, EXCISION:  - Invasive moderately differentiated squamous cell carcinoma, see  comment   P 16 positive  INTERVAL HISTORY  Erik [name] arrived for FU appointment today He is doing well. First cycle of chemo went well No nausea, tinnitus, neuropathy, fevers, chills, diarrhea or dysuria.  Urine is pale colored. He has pain around the port and G tube, since this was just placed. He will meeting the nutritionist today He otherwise feels at baseline.  REVIEW OF SYSTEMS:   Constitutional: Denies fevers, chills or abnormal night sweats Eyes: Denies blurriness of vision, double vision or watery eyes Ears, nose, mouth, throat, and face: As mentioned above respiratory: Denies cough, dyspnea or wheezes Cardiovascular: Denies palpitation, chest discomfort or lower extremity swelling Gastrointestinal:  Denies nausea, heartburn or change in bowel habits Skin: Denies abnormal skin rashes Lymphatics: Denies new lymphadenopathy or easy bruising Neurological:Denies numbness, tingling  or new weaknesses Behavioral/Psych: Mood is  stable, no new changes  All other systems were reviewed with the patient and are negative.  MEDICAL HISTORY:  Past Medical History:  Diagnosis Date  . Coronary artery disease   . Dyspnea   . GERD (gastroesophageal reflux disease)    [employer] 02/21/2020  . Ruptured lumbar intervertebral disc     SURGICAL HISTORY: Past Surgical History:  Procedure Laterality Date  . CARDIAC CATHETERIZATION     with stent placement  . IR GASTROSTOMY TUBE MOD SED  04/20/2020  . IR IMAGING GUIDED PORT INSERTION  04/20/2020  . TONSILLECTOMY    . TRACHEOSTOMY TUBE PLACEMENT N/A 03/05/2020   Procedure: AWAKE TRACHEOSTOMY, DIRECT LARYNGOSCOPY WITH BIOPSY;  Surgeon: Melida Quitter, MD;  Location: [facility];  Service: ENT;  Laterality: N/A;    SOCIAL HISTORY: Social History   Socioeconomic History  . Marital status: Married    Spouse name: Not on file  . Number of children: Not on file  . Years of education: Not on file  . Highest education level: Not on file  Occupational History  . Not on file  Tobacco Use  . Smoking status: Current Every Day Smoker    Types: Cigarettes  . Smokeless tobacco: Former Systems developer    Types: Secondary school teacher  . Vaping Use: Former  Substance and Sexual Activity  . Alcohol use: No  . Drug use: No  . Sexual activity: Yes  Other Topics Concern  . Not on file  Social History Narrative  . Not on file   Social Determinants of Health   Financial Resource Strain: Not on file  Food Insecurity: Not on file  Transportation Needs: No Transportation Needs  . Lack of Transportation (Medical): No  . Lack of Transportation (Non-Medical): No  Physical Activity: Not on file  Stress: Not on file  Social Connections: Not on file  Intimate Partner Violence: Not on file    FAMILY HISTORY: No family history on file.  ALLERGIES:  has No Known Allergies.  MEDICATIONS:  Current Outpatient Medications  Medication Sig Dispense Refill  . dexamethasone (DECADRON) 4 MG tablet Take 2  tablets (8 mg total) by mouth daily. Take daily x 3 days starting the day after cisplatin chemotherapy. Take with food. 30 tablet 1  . ibuprofen (ADVIL,MOTRIN) 200 MG tablet Take 800 mg by mouth every 8 (eight) hours as needed for mild pain. For pain    . lidocaine-prilocaine (EMLA) cream Apply to affected area once 30 g 3  . LORazepam (ATIVAN) 0.5 MG tablet Take 1 tablet (0.5 mg total) by mouth every 6 (six) hours as needed (Nausea or vomiting). 30 tablet 0  . Multiple Vitamin (MULTIVITAMIN WITH MINERALS) TABS tablet Take 1 tablet by mouth daily.    . ondansetron (ZOFRAN) 8 MG tablet Take 1 tablet (8 mg total) by mouth 2 (two) times daily as needed. Start on the third day after cisplatin chemotherapy. 30 tablet 1  . prochlorperazine (COMPAZINE) 10 MG tablet Take 1 tablet (10 mg total) by mouth every 6 (six) hours as needed (Nausea or vomiting). 30 tablet 1   No current facility-administered medications for this visit.   Facility-Administered Medications Ordered in Other Visits  Medication Dose Route Frequency Provider Last Rate Last Admin  . heparin lock flush 100 unit/mL  500 Units Intracatheter Once PRN Bettylou Frew, MD      . magnesium sulfate IVPB 2 g 50 mL  2 g Intravenous Once Benay Pike, MD 50 mL/hr  at 04/21/20 0835 2 g at 04/21/20 0835  . sodium chloride flush (NS) 0.9 % injection 10 mL  10 mL Intracatheter PRN Tracey Stewart, MD         PHYSICAL EXAMINATION:  ECOG PERFORMANCE STATUS: 0 - Asymptomatic  There were no vitals filed for this visit. There were no vitals filed for this visit.  GENERAL:alert, no distress and comfortable NECK:  Tracheostomy in place, ongoing cough.  trach site without any overt signs of infection. LYMPH: No obvious palpable lymphadenopathy LUNGS: clear to auscultation and percussion with normal breathing effort HEART: regular rate & rhythm and no murmurs and no lower extremity edema ABDOMEN:abdomen soft, non-tender and normal bowel  sounds Musculoskeletal:no cyanosis of digits and no clubbing  PSYCH: alert & oriented x 3 with fluent speech NEURO: no focal motor/sensory deficits  LABORATORY DATA:  I have reviewed the data as listed Lab Results  Component Value Date   WBC 11.0 (H) 04/21/2020   HGB 12.7 (L) 04/21/2020   HCT 38.0 (L) 04/21/2020   MCV 89.6 04/21/2020   PLT 382 04/21/2020     Chemistry      Component Value Date/Time   NA 138 04/21/2020 0734   K 3.8 04/21/2020 0734   CL 99 04/21/2020 0734   CO2 26 04/21/2020 0734   BUN 15 04/21/2020 0734   CREATININE 0.83 04/21/2020 0734   CREATININE 0.79 03/23/2020 1013      Component Value Date/Time   CALCIUM 9.4 04/21/2020 0734   ALKPHOS 88 03/23/2020 1013   AST 24 03/23/2020 1013   ALT 41 03/23/2020 1013   BILITOT 0.3 03/23/2020 1013     CBC reviewed, mild leukocytosis. BMP reviewed, no evidence of creatinine impairment Magnesium of 1.7.  RADIOGRAPHIC STUDIES: I have personally reviewed the radiological images as listed and agreed with the findings in the report. IR Gastrostomy Tube  Result Date: 04/20/2020 INDICATION: Laryngeal malignancy EXAM: Percutaneous gastrostomy tube placement MEDICATIONS: Ancef 2 g IV; Antibiotics were administered within 1 hour of the procedure. Glucagon 1 mg IV ANESTHESIA/SEDATION: Versed 3 mg IV; Fentanyl 100 mcg IV Moderate Sedation Time:  20 The patient was continuously monitored during the procedure by the interventional radiology nurse under my direct supervision. CONTRAST:  10 mL of Omnipaque-administered into the gastric lumen. FLUOROSCOPY TIME:  Fluoroscopy Time: 3 minutes 30 seconds (28 mGy). COMPLICATIONS: None immediate. PROCEDURE: Informed written consent was obtained from the patient after a thorough discussion of the procedural risks, benefits and alternatives. All questions were addressed. Maximal Sterile Barrier Technique was utilized including caps, mask, sterile gowns, sterile gloves, sterile drape, hand  hygiene and skin antiseptic. A timeout was performed prior to the initiation of the procedure. The left hepatic lobe margin was marked utilizing ultrasound guidance. The epigastric region was prepped and draped in the usual sterile fashion. The stomach was insufflated utilizing the NG tube. Following local lidocaine administration, three gastropexies were placed to secure the anterior wall of the stomach to the anterior abdominal wall. Percutaneous access obtained into the gastric antrum at the center of the gastropexies with an 18 gauge needle. Guide wire advanced into the gastric lumen. Gastrostomy tube placed on the guidewire. 10 mm balloon loaded on the guidewire and advanced through the gastrostomy tube. 10 mm balloon insufflated in the anterior abdominal/gastric wall and both balloon and 16 French gastrostomy tube inserted into the gastric lumen. The 10 mm balloon was removed. The G tube retention balloon was inflated with 7 mL of dilute contrast and retracted to  the anterior gastric wall. Contrast administrated through the gastrostomy tube opacified the gastric lumen. The insertion site was covered with sterile dressing. IMPRESSION: Fluoroscopy guided percutaneous gastrostomy tube placement as above. Electronically Signed   By: Miachel Roux M.D.   On: 04/20/2020 15:55   NM PET Image Initial (PI) Skull Base To Thigh  Result Date: 04/05/2020 CLINICAL DATA:  Initial treatment strategy for head neck cancer. EXAM: NUCLEAR MEDICINE PET SKULL BASE TO THIGH TECHNIQUE: 6.0 mCi F-18 FDG was injected intravenously. Full-ring PET imaging was performed from the skull base to thigh after the radiotracer. CT data was obtained and used for attenuation correction and anatomic localization. Fasting blood glucose: 82 mg/dl COMPARISON:  Neck CT 03/04/2020 FINDINGS: Mediastinal blood pool activity: SUV max 1.8 Liver activity: SUV max NA NECK: Marked hypermetabolism is identified in the glottic region, compatible with the  patient's known primary. SUV max = 17.3. The left-sided level II lymph node identified on diagnostic neck CT shows low level hypermetabolism with SUV max = 2.0. Activity along the patient's tracheostomy tube is compatible with uptake related to scarring/granulation. The 1.4 cm part cystic/part solid cutaneous/subcutaneous lesion described in the midline posterior upper neck on previous CT shows no hypermetabolism. Incidental CT findings: none CHEST: 9 mm ground-glass opacity in the posterior right upper lobe (84/4) shows low level FDG uptake with SUV max = 1.1. No hypermetabolic lymphadenopathy in the chest. Incidental CT findings: Coronary artery calcification is evident. Atherosclerotic calcification is noted in the wall of the thoracic aorta. Centrilobular emphysema noted. ABDOMEN/PELVIS: No abnormal hypermetabolic activity within the liver, pancreas, adrenal glands, or spleen. No hypermetabolic lymph nodes in the abdomen or pelvis. Isolated focus of uptake anteromedial to the left psoas muscle is likely excreted radiotracer within the ureter. Incidental CT findings: There is abdominal aortic atherosclerosis without aneurysm. SKELETON: No focal hypermetabolic activity to suggest skeletal metastasis. Incidental CT findings: none IMPRESSION: 1. Marked hypermetabolism in the glottic region, consistent with the patient's known primary neoplasm. 2. Left-sided level II lymph node described on previous diagnostic neck CT shows low level FDG accumulation, raising concern for metastatic involvement. 3. No evidence for hypermetabolic metastatic disease in the chest, abdomen, or pelvis. 4. 9 mm ground-glass opacity in the posterior right upper lobe with low level FDG uptake. This may be infectious/inflammatory. Continued attention on follow-up recommended. 5.  Aortic Atherosclerois (ICD10-170.0) 6.  Emphysema. ([id number]) Electronically Signed   By: Misty Stanley M.D.   On: 04/05/2020 15:18   IR IMAGING GUIDED PORT  INSERTION  Result Date: 04/20/2020 INDICATION: Nasopharyngeal carcinoma EXAM: IMPLANTED PORT A CATH PLACEMENT WITH ULTRASOUND AND FLUOROSCOPIC GUIDANCE MEDICATIONS: None ANESTHESIA/SEDATION: Moderate (conscious) sedation was employed during this procedure. A total of Versed 3 mg and Fentanyl 100 mcg was administered intravenously. Moderate Sedation Time: 21 minutes. The patient's level of consciousness and vital signs were monitored continuously by radiology nursing throughout the procedure under my direct supervision. FLUOROSCOPY TIME:  0 minutes, 45 seconds (3 mGy) COMPLICATIONS: None immediate. PROCEDURE: The procedure, risks, benefits, and alternatives were explained to the patient. Questions regarding the procedure were encouraged and answered. The patient understands and consents to the procedure. A timeout was performed prior to the initiation of the procedure. Patient positioned supine on the angiography table. Right neck and anterior upper chest prepped and draped in the usual sterile fashion. All elements of maximal sterile barrier were utilized including, cap, mask, sterile gown, sterile gloves, large sterile drape, hand scrubbing and 2% Chlorhexidine for skin cleaning. The right internal  jugular vein was evaluated with ultrasound and shown to be patent. A permanent ultrasound image was obtained and placed in the patient's medical record. Local anesthesia was provided with 1% lidocaine with epinephrine. Using sterile gel and a sterile probe cover, the right internal jugular vein was entered with a 21 ga needle during real time ultrasound guidance. 0.018 inch guidewire placed and 21 ga needle exchanged for transitional dilator set. Utilizing fluoroscopy, 0.035 inch guidewire advanced through the needle without difficulty. Attention then turned to the right anterior upper chest. Following local lidocaine administration, a port pocket was created. The catheter was connected to the port and brought from the  pocket to the venotomy site through a subcutaneous tunnel. The catheter was cut to size and inserted through the peel-away sheath. The catheter tip was positioned at the cavoatrial junction using fluoroscopic guidance. The port aspirated and flushed well. The port pocket was closed with deep and superficial absorbable suture. The port pocket incision and venotomy sites were also sealed with Dermabond. IMPRESSION: Successful placement of a right internal jugular approach power injectable Port-A-Cath. The catheter is ready for immediate use. Electronically Signed   By: Miachel Roux M.D.   On: 04/20/2020 15:50    All questions were answered. The patient knows to call the clinic with any problems, questions or concerns. I spent 30 minutes in the care of this patient including H and P, review of records, counseling and coordination of care.     Benay Pike, MD 04/21/2020 9:03 AM

## 2020-04-20 NOTE — Consult Note (Signed)
Chief Complaint: Patient was seen in consultation today for Port-A-Cath and gastrostomy tube placements  Referring Physician(s): Iruku,Praveena  Supervising Physician: Mir, Sharen Heck  Patient Status: [facility] - Out-pt  History of Present Illness: Erik [name] is a 55 y.o. male, ex smoker, with past medical history of coronary artery disease with stenting, COPD,  GERD and now with newly diagnosed squamous cell carcinoma of the larynx who presents today for Port-A-Cath and gastrostomy tube placements while undergoing chemoradiation.   Past Medical History:  Diagnosis Date  . Coronary artery disease   . Dyspnea   . GERD (gastroesophageal reflux disease)    Phreesia 02/21/2020  . Ruptured lumbar intervertebral disc     Past Surgical History:  Procedure Laterality Date  . CARDIAC CATHETERIZATION     with stent placement  . TONSILLECTOMY    . TRACHEOSTOMY TUBE PLACEMENT N/A 03/05/2020   Procedure: AWAKE TRACHEOSTOMY, DIRECT LARYNGOSCOPY WITH BIOPSY;  Surgeon: Melida Quitter, MD;  Location: [facility] ORS;  Service: ENT;  Laterality: N/A;    Allergies: Patient has no known allergies.  Medications: Prior to Admission medications   Medication Sig Start Date End Date Taking? Authorizing Provider  dexamethasone (DECADRON) 4 MG tablet Take 2 tablets (8 mg total) by mouth daily. Take daily x 3 days starting the day after cisplatin chemotherapy. Take with food. 04/13/20   Benay Pike, MD  ibuprofen (ADVIL,MOTRIN) 200 MG tablet Take 800 mg by mouth every 8 (eight) hours as needed for mild pain. For pain    [provider]  lidocaine-prilocaine (EMLA) cream Apply to affected area once 04/13/20   Iruku, Arletha Pili, MD  LORazepam (ATIVAN) 0.5 MG tablet Take 1 tablet (0.5 mg total) by mouth every 6 (six) hours as needed (Nausea or vomiting). 04/13/20   Benay Pike, MD  Multiple Vitamin (MULTIVITAMIN WITH MINERALS) TABS tablet Take 1 tablet by mouth daily.    [provider]   ondansetron (ZOFRAN) 8 MG tablet Take 1 tablet (8 mg total) by mouth 2 (two) times daily as needed. Start on the third day after cisplatin chemotherapy. 04/13/20   Benay Pike, MD  prochlorperazine (COMPAZINE) 10 MG tablet Take 1 tablet (10 mg total) by mouth every 6 (six) hours as needed (Nausea or vomiting). 04/13/20   Benay Pike, MD     No family history on file.  Social History   Socioeconomic History  . Marital status: Married    Spouse name: Not on file  . Number of children: Not on file  . Years of education: Not on file  . Highest education level: Not on file  Occupational History  . Not on file  Tobacco Use  . Smoking status: Current Every Day Smoker    Types: Cigarettes  . Smokeless tobacco: Former Systems developer    Types: Secondary school teacher  . Vaping Use: Former  Substance and Sexual Activity  . Alcohol use: No  . Drug use: No  . Sexual activity: Yes  Other Topics Concern  . Not on file  Social History Narrative  . Not on file   Social Determinants of Health   Financial Resource Strain: Not on file  Food Insecurity: Not on file  Transportation Needs: No Transportation Needs  . Lack of Transportation (Medical): No  . Lack of Transportation (Non-Medical): No  Physical Activity: Not on file  Stress: Not on file  Social Connections: Not on file      Review of Systems currently denies fever, headache, chest pain, worsening dyspnea, abdominal/back pain, vomiting  or bleeding.  Does have occasional cough as well as intermittent nausea.  Vital Signs:pending    Physical Exam awake, alert.  Trach in place.  Chest with scattered rhonchi, distant breath sounds bilaterally, more so left base.; heart with regular rate and rhythm.  Abdomen soft, positive bowel sounds, nontender.  No lower extremity edema.   Imaging: NM PET Image Initial (PI) Skull Base To Thigh  Result Date: 04/05/2020 CLINICAL DATA:  Initial treatment strategy for head neck cancer. EXAM: NUCLEAR  MEDICINE PET SKULL BASE TO THIGH TECHNIQUE: 6.0 mCi F-18 FDG was injected intravenously. Full-ring PET imaging was performed from the skull base to thigh after the radiotracer. CT data was obtained and used for attenuation correction and anatomic localization. Fasting blood glucose: 82 mg/dl COMPARISON:  Neck CT 03/04/2020 FINDINGS: Mediastinal blood pool activity: SUV max 1.8 Liver activity: SUV max NA NECK: Marked hypermetabolism is identified in the glottic region, compatible with the patient's known primary. SUV max = 17.3. The left-sided level II lymph node identified on diagnostic neck CT shows low level hypermetabolism with SUV max = 2.0. Activity along the patient's tracheostomy tube is compatible with uptake related to scarring/granulation. The 1.4 cm part cystic/part solid cutaneous/subcutaneous lesion described in the midline posterior upper neck on previous CT shows no hypermetabolism. Incidental CT findings: none CHEST: 9 mm ground-glass opacity in the posterior right upper lobe (84/4) shows low level FDG uptake with SUV max = 1.1. No hypermetabolic lymphadenopathy in the chest. Incidental CT findings: Coronary artery calcification is evident. Atherosclerotic calcification is noted in the wall of the thoracic aorta. Centrilobular emphysema noted. ABDOMEN/PELVIS: No abnormal hypermetabolic activity within the liver, pancreas, adrenal glands, or spleen. No hypermetabolic lymph nodes in the abdomen or pelvis. Isolated focus of uptake anteromedial to the left psoas muscle is likely excreted radiotracer within the ureter. Incidental CT findings: There is abdominal aortic atherosclerosis without aneurysm. SKELETON: No focal hypermetabolic activity to suggest skeletal metastasis. Incidental CT findings: none IMPRESSION: 1. Marked hypermetabolism in the glottic region, consistent with the patient's known primary neoplasm. 2. Left-sided level II lymph node described on previous diagnostic neck CT shows low level  FDG accumulation, raising concern for metastatic involvement. 3. No evidence for hypermetabolic metastatic disease in the chest, abdomen, or pelvis. 4. 9 mm ground-glass opacity in the posterior right upper lobe with low level FDG uptake. This may be infectious/inflammatory. Continued attention on follow-up recommended. 5.  Aortic Atherosclerois (ICD10-170.0) 6.  Emphysema. ([id number]) Electronically Signed   By: Misty Stanley M.D.   On: 04/05/2020 15:18    Labs:  CBC: Recent Labs    11/25/19 0846 03/04/20 1219 04/13/20 1446  WBC 7.0 8.4 11.4*  HGB 14.1 14.0 13.1  HCT 42.0 42.5 39.0  PLT 338 353 382    COAGS: No results for input(s): INR, APTT in the last 8760 hours.  BMP: Recent Labs    11/25/19 0846 03/04/20 1219 03/23/20 1013 04/13/20 1446  NA 142 141 140 139  K 4.0 3.6 4.2 4.2  CL 105 102 102 101  CO2 27 29 27 30   GLUCOSE 91 104* 118* 108*  BUN 18 21* 26* 18  CALCIUM 9.3 9.6 9.6 9.7  CREATININE 0.75 0.83 0.79 0.90  GFRNONAA >60 >60 >60 >60    LIVER FUNCTION TESTS: Recent Labs    11/25/19 0846 03/23/20 1013  BILITOT 0.6 0.3  AST 22 24  ALT 25 41  ALKPHOS 65 88  PROT 7.2 7.3  ALBUMIN 3.8 3.3*  TUMOR MARKERS: No results for input(s): AFPTM, CEA, CA199, CHROMGRNA in the last 8760 hours.  Assessment and Plan:  55 y.o. male, ex smoker, with past medical history of coronary artery disease with stenting, COPD,  GERD and now with newly diagnosed squamous cell carcinoma of the larynx who presents today for Port-A-Cath and gastrostomy tube placements while undergoing chemoradiation.  Details/risks of procedures, including but not limited to, internal bleeding, infection, injury to adjacent structures discussed with patient with his understanding and consent.   Thank you for this interesting consult.  I greatly enjoyed meeting Erik [name] and look forward to participating in their care.  A copy of this report was sent to the requesting provider on this  date.  Electronically Signed: D. Rowe Robert, PA-C 04/20/2020, 8:44 AM   I spent a total of  25 minutes   in face to face in clinical consultation, greater than 50% of which was counseling/coordinating care for Port-A-Cath and gastrostomy tube placements

## 2020-04-20 NOTE — Sedation Documentation (Signed)
Start PEG placement

## 2020-04-20 NOTE — Sedation Documentation (Signed)
Same as port

## 2020-04-20 NOTE — Procedures (Signed)
Interventional Radiology Procedure Note  Procedure:  1. Placement of percutaneous 16 Fr balloon retention gastrostomy tube. 2.  Right IJ Chest port  Complications: None  Recommendations: - NPO except for sips and chips remainder of today and overnight - Maintain G-tube to LWS while in recovery - May advance diet as tolerated and begin using tube tomorrow morning   Paula Libra Leanah Kolander, MD

## 2020-04-20 NOTE — Discharge Instructions (Signed)
Please call Interventional Radiology clinic [phone] with any questions or concerns.  You may remove your dressing and shower tomorrow.  DO NOT use EMLA cream for 2 weeks after port placement as this cream will remove surgical glue on your incision.   Implanted Port Insertion, Care After This sheet gives you information about how to care for yourself after your procedure. Your health care provider may also give you more specific instructions. If you have problems or questions, contact your health care provider. What can I expect after the procedure? After the procedure, it is common to have:  Discomfort at the port insertion site.  Bruising on the skin over the port. This should improve over 3-4 days. Follow these instructions at home: Port care  After your port is placed, you will get a manufacturer's information card. The card has information about your port. Keep this card with you at all times.  Take care of the port as told by your health care provider. Ask your health care provider if you or a family member can get training for taking care of the port at home. A home health care nurse may also take care of the port.  Make sure to remember what type of port you have. Incision care  Follow instructions from your health care provider about how to take care of your port insertion site. Make sure you: ? Wash your hands with soap and water before and after you change your bandage (dressing). If soap and water are not available, use hand sanitizer. ? Change your dressing as told by your health care provider. ? Leave stitches (sutures), skin glue, or adhesive strips in place. These skin closures may need to stay in place for 2 weeks or longer. If adhesive strip edges start to loosen and curl up, you may trim the loose edges. Do not remove adhesive strips completely unless your health care provider tells you to do that.  Check your port insertion site every day for signs of infection.  Check for: ? Redness, swelling, or pain. ? Fluid or blood. ? Warmth. ? Pus or a bad smell.      Activity  Return to your normal activities as told by your health care provider. Ask your health care provider what activities are safe for you.  Do not lift anything that is heavier than 10 lb (4.5 kg), or the limit that you are told, until your health care provider says that it is safe. General instructions  Take over-the-counter and prescription medicines only as told by your health care provider.  Do not take baths, swim, or use a hot tub until your health care provider approves. Ask your health care provider if you may take showers. You may only be allowed to take sponge baths.  Do not drive for 24 hours if you were given a sedative during your procedure.  Wear a medical alert bracelet in case of an emergency. This will tell any health care providers that you have a port.  Keep all follow-up visits as told by your health care provider. This is important. Contact a health care provider if:  You cannot flush your port with saline as directed, or you cannot draw blood from the port.  You have a fever or chills.  You have redness, swelling, or pain around your port insertion site.  You have fluid or blood coming from your port insertion site.  Your port insertion site feels warm to the touch.  You have pus or a   bad smell coming from the port insertion site. Get help right away if:  You have chest pain or shortness of breath.  You have bleeding from your port that you cannot control. Summary  Take care of the port as told by your health care provider. Keep the manufacturer's information card with you at all times.  Change your dressing as told by your health care provider.  Contact a health care provider if you have a fever or chills or if you have redness, swelling, or pain around your port insertion site.  Keep all follow-up visits as told by your health care  provider. This information is not intended to replace advice given to you by your health care provider. Make sure you discuss any questions you have with your health care provider. Document Revised: 08/14/2017 Document Reviewed: 08/14/2017 Elsevier Patient Education  2021 [place].   How to Care for a Feeding Tube A feeding tube is a tube used to give medicine, water, and liquid food. A person may have this tube if she or he has trouble swallowing or cannot take food or medicine by mouth. Supplies needed to care for the tube site:  Clean gloves.  Clean washcloth, gauze pads, or soft paper towel.  Cotton swabs.  A skin barrier ointment or cream, such as petroleum jelly.  Soap and water.  Pre-cut foam pads or gauze (for around the tube).  Tube tape.  An anchoring device (optional). How to care for the tube site 1. Have all supplies ready and close to you. 2. Wash your hands with soap and water for at least 20 seconds. 3. Put on the gloves. 4. Change any pad or gauze near the tube if: ? It is dirty. ? It is wet. ? It has been there for more than one day. 5. Check the skin around the tube. Call the doctor if you see any of these: ? Red skin. ? A rash. ? Swelling. ? Leaking fluid. ? Extra skin. 6. Dip the gauze and cotton swabs in water and soap. 7. Use the cotton swabs to wipe the skin that is closest to the tube. 8. Use the gauze pads to wipe the rest of the skin near the tube. 9. Rinse with water. 10. Dry the area with a clean washcloth, dry gauze pad, or soft paper towel. 11. If the skin is red, use a cotton swab to put on a skin barrier cream or ointment. Put it on by making little circles. Do not apply antibiotic ointments at the tube site. 12. Put a new pre-cut foam pad or gauze around the tube. If there is no fluid leaking at the tube site, you do not need a pad or gauze. 13. Tape down the edges. 14. Use tape or an anchoring device to attach the tube to the  skin. Do this for comfort or as told. Each time you use tape, put it in a different place. 15. Help the person sit halfway up (about a 30- to 45-degree angle). 16. Throw away used supplies. 17. Take off your gloves. 18. Wash your hands with soap and water for at least 20 seconds.      Supplies needed to flush a feeding tube:  Clean gloves.  A clean 60 mL syringe that connects to the feeding tube.  A towel.  Germ-free (sterile) or purified water. Follow these rules: ? Use germ-free water if:  Your body's defense system (immune system) is weak and you have trouble getting better from  infections (are immunocompromised).  You do not know how many chemicals are in your water. ? Do not use water from lakes or other bodies of water unless you treat it or filter it first. ? To make drinking water pure by boiling:  Boil water for 1 minute or longer. Keep a lid over the water while it boils.  Let the water cool to room temperature before you use it. How to flush a feeding tube 1. Have all supplies ready and close to you. 2. Wash your hands with soap and water for at least 20 seconds. 3. Put on gloves. 4. Pull 30 mL of water into the syringe. 5. Before you push water into the tube (flush the tube), put the towel under the tube to catch any fluid leaks. 6. Bend or kink the feeding tube while you do one of these things: ? Disconnect it from the feeding-bag tubing. ? Take off the cap at the end of the tube. 7. Put the tip of the syringe into the end of the feeding tube. 8. Stop bending the tube. 9. Use the syringe to slowly put the water in the tube. If the water will not go in the tube: ? Have the person lie on his or her left side. ? Try putting the water in the tube again. ? Do not push hard to make the water go in. 10. Take out the syringe and put the cap on the tube. 11. Throw away used supplies. 12. Take off your gloves. 13. Wash your hands with soap and water for at least 20  seconds.      General tips and recommendations Caring for the tube  If the person has a foam pad or gauze near the tube, change it: ? Every day. ? When it is dirty. ? When it is wet.  Do not put antibiotic ointments by the tube. Flushing the tube  Do not use a syringe that is smaller than 60 mL.  Flush the tube at all of these times: ? Before you give medicine. ? Between medicines. ? After the person gets the last medicine before a feeding.  Do not mix medicines with formula. Do not mix medicines with other medicines.  Completely flush medicines through the tube. That way, they will not mix with formula. Contact a doctor if:  The tube gets blocked or clogged.  You find any of these on the skin around the tube site: ? Red skin. ? A rash. ? Swelling. ? Leaking fluid. ? Extra skin. Summary  A feeding tube is a tube used to give medicine, water, and liquid food. A person may have this tube if she or he has trouble swallowing or cannot take food or medicine by mouth.  Follow the doctor's instructions to care for the tube site and flush the tube every day.  Contact a doctor if the tube gets blocked or clogged. This information is not intended to replace advice given to you by your health care provider. Make sure you discuss any questions you have with your health care provider. Document Revised: 05/15/2019 Document Reviewed: 05/15/2019 Elsevier Patient Education  2021 [place].   Moderate Conscious Sedation, Adult, Care After This sheet gives you information about how to care for yourself after your procedure. Your health care provider may also give you more specific instructions. If you have problems or questions, contact your health care provider. What can I expect after the procedure? After the procedure, it is common to have:  Sleepiness for several hours.  Impaired judgment for several hours.  Difficulty with balance.  Vomiting if you eat too soon. Follow  these instructions at home: For the time period you were told by your health care provider:  Rest.  Do not participate in activities where you could fall or become injured.  Do not drive or use machinery.  Do not drink alcohol.  Do not take sleeping pills or medicines that cause drowsiness.  Do not make important decisions or sign legal documents.  Do not take care of children on your own.      Eating and drinking  Follow the diet recommended by your health care provider.  Drink enough fluid to keep your urine pale yellow.  If you vomit: ? Drink water, juice, or soup when you can drink without vomiting. ? Make sure you have little or no nausea before eating solid foods.   General instructions  Take over-the-counter and prescription medicines only as told by your health care provider.  Have a responsible adult stay with you for the time you are told. It is important to have someone help care for you until you are awake and alert.  Do not smoke.  Keep all follow-up visits as told by your health care provider. This is important. Contact a health care provider if:  You are still sleepy or having trouble with balance after 24 hours.  You feel light-headed.  You keep feeling nauseous or you keep vomiting.  You develop a rash.  You have a fever.  You have redness or swelling around the IV site. Get help right away if:  You have trouble breathing.  You have new-onset confusion at home. Summary  After the procedure, it is common to feel sleepy, have impaired judgment, or feel nauseous if you eat too soon.  Rest after you get home. Know the things you should not do after the procedure.  Follow the diet recommended by your health care provider and drink enough fluid to keep your urine pale yellow.  Get help right away if you have trouble breathing or new-onset confusion at home. This information is not intended to replace advice given to you by your health care  provider. Make sure you discuss any questions you have with your health care provider. Document Revised: 05/16/2019 Document Reviewed: 12/12/2018 Elsevier Patient Education  2021 [employer].

## 2020-04-20 NOTE — Sedation Documentation (Signed)
Peg completed

## 2020-04-21 ENCOUNTER — Inpatient Hospital Stay: Payer: 59

## 2020-04-21 ENCOUNTER — Inpatient Hospital Stay (HOSPITAL_BASED_OUTPATIENT_CLINIC_OR_DEPARTMENT_OTHER): Payer: 59 | Admitting: Hematology and Oncology

## 2020-04-21 ENCOUNTER — Encounter: Payer: Self-pay | Admitting: Hematology and Oncology

## 2020-04-21 ENCOUNTER — Ambulatory Visit
Admission: RE | Admit: 2020-04-21 | Discharge: 2020-04-21 | Disposition: A | Discharge status: Home / Self Care | Payer: 59 | Source: Ambulatory Visit | Attending: Radiation Oncology | Admitting: Radiation Oncology

## 2020-04-21 VITALS — BP 134/80 | HR 95 | Temp 98.9°F | Resp 16 | Wt 126.0 lb

## 2020-04-21 DIAGNOSIS — E440 Moderate protein-calorie malnutrition: Secondary | ICD-10-CM

## 2020-04-21 DIAGNOSIS — Z720 Tobacco use: Secondary | ICD-10-CM | POA: Diagnosis not present

## 2020-04-21 DIAGNOSIS — Z930 Tracheostomy status: Secondary | ICD-10-CM | POA: Diagnosis not present

## 2020-04-21 DIAGNOSIS — C321 Malignant neoplasm of supraglottis: Secondary | ICD-10-CM

## 2020-04-21 LAB — BASIC METABOLIC PANEL
Anion gap: 13 (ref 5–15)
BUN: 15 mg/dL (ref 6–20)
CO2: 26 mmol/L (ref 22–32)
Calcium: 9.4 mg/dL (ref 8.9–10.3)
Chloride: 99 mmol/L (ref 98–111)
Creatinine, Ser: 0.83 mg/dL (ref 0.61–1.24)
GFR, Estimated: >60 mL/min (ref >60)
Glucose, Bld: 126 mg/dL — ABNORMAL HIGH (ref 70–99)
Potassium: 3.8 mmol/L (ref 3.5–5.1)
Sodium: 138 mmol/L (ref 135–145)

## 2020-04-21 LAB — CBC WITH DIFFERENTIAL/PLATELET
Abs Immature Granulocytes: 0.06 10*3/uL (ref 0.00–0.07)
Basophils Absolute: 0 10*3/uL (ref 0.0–0.1)
Basophils Relative: 0 %
Eosinophils Absolute: 0.4 10*3/uL (ref 0.0–0.5)
Eosinophils Relative: 4 %
HCT: 38 % — ABNORMAL LOW (ref 39.0–52.0)
Hemoglobin: 12.7 g/dL — ABNORMAL LOW (ref 13.0–17.0)
Immature Granulocytes: 1 %
Lymphocytes Relative: 10 %
Lymphs Abs: 1.1 10*3/uL (ref 0.7–4.0)
MCH: 30 pg (ref 26.0–34.0)
MCHC: 33.4 g/dL (ref 30.0–36.0)
MCV: 89.6 fL (ref 80.0–100.0)
Monocytes Absolute: 0.7 10*3/uL (ref 0.1–1.0)
Monocytes Relative: 6 %
Neutro Abs: 8.8 10*3/uL — ABNORMAL HIGH (ref 1.7–7.7)
Neutrophils Relative %: 79 %
Platelets: 382 10*3/uL (ref 150–400)
RBC: 4.24 MIL/uL (ref 4.22–5.81)
RDW: 13.8 % (ref 11.5–15.5)
WBC: 11 10*3/uL — ABNORMAL HIGH (ref 4.0–10.5)
nRBC: 0 % (ref 0.0–0.2)

## 2020-04-21 LAB — MAGNESIUM: Magnesium: 1.7 mg/dL (ref 1.7–2.4)

## 2020-04-21 MED ORDER — MAGNESIUM SULFATE 2 GM/50ML IV SOLN
2.0000 g | Freq: Once | INTRAVENOUS | Status: AC
  Administered 2020-04-21: 2 g via INTRAVENOUS

## 2020-04-21 MED ORDER — SODIUM CHLORIDE 0.9% FLUSH
10.0000 mL | INTRAVENOUS | Status: DC | PRN
  Administered 2020-04-21: 10 mL
  Filled 2020-04-21: qty 10

## 2020-04-21 MED ORDER — PALONOSETRON HCL INJECTION 0.25 MG/5ML
INTRAVENOUS | Status: AC
  Filled 2020-04-21: qty 5

## 2020-04-21 MED ORDER — SODIUM CHLORIDE 0.9 % IV SOLN
10.0000 mg | Freq: Once | INTRAVENOUS | Status: AC
  Administered 2020-04-21: 10 mg via INTRAVENOUS
  Filled 2020-04-21: qty 10

## 2020-04-21 MED ORDER — HEPARIN SOD (PORK) LOCK FLUSH 100 UNIT/ML IV SOLN
500.0000 [IU] | Freq: Once | INTRAVENOUS | Status: AC | PRN
  Administered 2020-04-21: 500 [IU]
  Filled 2020-04-21: qty 5

## 2020-04-21 MED ORDER — SODIUM CHLORIDE 0.9 % IV SOLN
Freq: Once | INTRAVENOUS | Status: AC
  Filled 2020-04-21: qty 250

## 2020-04-21 MED ORDER — CISPLATIN CHEMO INJECTION 100MG/100ML
40.0000 mg/m2 | Freq: Once | INTRAVENOUS | Status: AC
  Administered 2020-04-21: 66 mg via INTRAVENOUS
  Filled 2020-04-21: qty 66

## 2020-04-21 MED ORDER — POTASSIUM CHLORIDE IN NACL 20-0.9 MEQ/L-% IV SOLN
Freq: Once | INTRAVENOUS | Status: AC
  Filled 2020-04-21: qty 1000

## 2020-04-21 MED ORDER — MAGNESIUM SULFATE 2 GM/50ML IV SOLN
INTRAVENOUS | Status: AC
  Filled 2020-04-21: qty 50

## 2020-04-21 MED ORDER — SODIUM CHLORIDE 0.9 % IV SOLN
150.0000 mg | Freq: Once | INTRAVENOUS | Status: AC
  Administered 2020-04-21: 150 mg via INTRAVENOUS
  Filled 2020-04-21: qty 150

## 2020-04-21 MED ORDER — PALONOSETRON HCL INJECTION 0.25 MG/5ML
0.2500 mg | Freq: Once | INTRAVENOUS | Status: AC
  Administered 2020-04-21: 0.25 mg via INTRAVENOUS

## 2020-04-21 NOTE — Progress Notes (Signed)
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    Met with patient in infusion.  Patient eating orally all consistencies of foods and tolerating well. Patient has trach.  Breakfast maybe bacon, eggs, grits or cereal or [employer] Breakfast bowl.  Lunch maybe sandwich or burger. Dinner is usually meat and couple sides (pork chop, green beans, macaroni and cheese and rolls).  Drinking 2-3 equate plus shakes per day (350 calories 13 g protein).  Reports only has trouble swallowing chips if does not chew them well.  Denies nutrition impact symptoms at this time.   Patient received PEG and port yesterday.  Noted Nurse Navigator came earlier today and provided lopez valve, flushed tube and dressing change.    22 F balloon retention Gastrostomy tube placed  Medications: reviewed  Labs: reviewed  Anthropometrics:   Weight 126 lb today increased from 120 lb on 2/22   Estimated Energy Needs  Kcals: 1700-2000 Protein: 85-100 g Fluid: 1.7 L  NUTRITION DIAGNOSIS: Unintentional weight loss stable   INTERVENTION:  RD assisted patient with giving first feeding via feeding tube of osmolite 1.5.  Patient was able to provide 1 carton of osmolite 1.5 with 60 ml water flush before and after feeding.  Patient to give 1 carton of osmolite 1.5 with 33m water flush before and after 1 time per day over the next week. Written instructions given to patient. Patient verbalized understanding.    Continue to eat orally as able Drink equate plus shakes as able.     MONITORING, EVALUATION, GOAL: weight trends, intake, tube feeding   NEXT VISIT: Wednesday, March 30 in infusion  Jowan Skillin B. AZenia Resides RCavour LQuebrada del AguaRegistered Dietitian [phone](mobile)

## 2020-04-21 NOTE — Assessment & Plan Note (Signed)
Trach site appear well with some clear sputum No concerns for infection

## 2020-04-21 NOTE — Assessment & Plan Note (Signed)
We have discussed about smoking cessation, he understands the need to cut it down but he doesn't feel motivated.

## 2020-04-21 NOTE — Progress Notes (Signed)
Oncology Nurse Navigator Documentation  Erik [name] requested that I see him during his chemotherapy infusion today. He had his PEG placed yesterday, but no clamp was included and he needed assistance. I met with him in infusion and added a Lopez valve to the tip of his PEG so he would be able to stop and start flow of the flush and nutritional supplements as he was using the PEG. I also changed the dressing to his PEG as well. There was a scant amount of dried blood present with no redness. The area is sore especially when he coughs and he was instructed to take Tylenol as needed by Dr. Chryl Heck. He knows that I am available to help him as needed with his PEG.   Harlow Asa RN, BSN, OCN Head & Neck Oncology Nurse [place] [facility] Phone # [phone]  Fax # [phone]

## 2020-04-21 NOTE — Patient Instructions (Signed)
[place] Discharge Instructions for Patients Receiving Chemotherapy  Today you received the following chemotherapy agents Cisplatin.  To help prevent nausea and vomiting after your treatment, we encourage you to take your nausea medication as indicated by your MD.   If you develop nausea and vomiting that is not controlled by your nausea medication, call the clinic.   BELOW ARE SYMPTOMS THAT SHOULD BE REPORTED IMMEDIATELY:  *FEVER GREATER THAN 100.5 F  *CHILLS WITH OR WITHOUT FEVER  NAUSEA AND VOMITING THAT IS NOT CONTROLLED WITH YOUR NAUSEA MEDICATION  *UNUSUAL SHORTNESS OF BREATH  *UNUSUAL BRUISING OR BLEEDING  TENDERNESS IN MOUTH AND THROAT WITH OR WITHOUT PRESENCE OF ULCERS  *URINARY PROBLEMS  *BOWEL PROBLEMS  UNUSUAL RASH Items with * indicate a potential emergency and should be followed up as soon as possible.  Feel free to call the clinic should you have any questions or concerns. The clinic phone number is [phone].  Please show the [place] at check-in to the Emergency Department and triage nurse.

## 2020-04-21 NOTE — Assessment & Plan Note (Signed)
[id number] Updated staging. He declined surgery. 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 If he has incomplete response with chemo radiation he will proceed with salvage surgery. He is now on week 2 of cisplatin No concerning ROS PE no concerns Labs reviewed, ok to proceed with treatment.

## 2020-04-21 NOTE — Assessment & Plan Note (Signed)
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 He now has a G tube, has nutrition appointment today. He will continue using G tube for nutrition and we will monitor the weight loss. He can take PRN tylenol BID for the next 5 days for management of pain around the G tube.

## 2020-04-22 ENCOUNTER — Other Ambulatory Visit: Payer: Self-pay

## 2020-04-22 ENCOUNTER — Ambulatory Visit
Admission: RE | Admit: 2020-04-22 | Discharge: 2020-04-22 | Disposition: A | Discharge status: Home / Self Care | Payer: 59 | Source: Ambulatory Visit | Attending: Radiation Oncology | Admitting: Radiation Oncology

## 2020-04-22 DIAGNOSIS — C321 Malignant neoplasm of supraglottis: Secondary | ICD-10-CM | POA: Diagnosis not present

## 2020-04-23 ENCOUNTER — Ambulatory Visit
Admission: RE | Admit: 2020-04-23 | Discharge: 2020-04-23 | Disposition: A | Discharge status: Home / Self Care | Payer: 59 | Source: Ambulatory Visit | Attending: Radiation Oncology | Admitting: Radiation Oncology

## 2020-04-23 DIAGNOSIS — C321 Malignant neoplasm of supraglottis: Secondary | ICD-10-CM | POA: Diagnosis not present

## 2020-04-26 ENCOUNTER — Other Ambulatory Visit: Payer: Self-pay | Admitting: Radiation Oncology

## 2020-04-26 ENCOUNTER — Other Ambulatory Visit: Payer: Self-pay

## 2020-04-26 ENCOUNTER — Ambulatory Visit
Admission: RE | Admit: 2020-04-26 | Discharge: 2020-04-26 | Disposition: A | Discharge status: Home / Self Care | Payer: 59 | Source: Ambulatory Visit | Attending: Radiation Oncology | Admitting: Radiation Oncology

## 2020-04-26 DIAGNOSIS — C321 Malignant neoplasm of supraglottis: Secondary | ICD-10-CM

## 2020-04-26 MED ORDER — LIDOCAINE VISCOUS HCL 2 % MT SOLN: OROMUCOSAL | 4 refills | Status: DC

## 2020-04-27 ENCOUNTER — Other Ambulatory Visit: Payer: 59

## 2020-04-27 ENCOUNTER — Ambulatory Visit
Admission: RE | Admit: 2020-04-27 | Discharge: 2020-04-27 | Disposition: A | Discharge status: Home / Self Care | Payer: 59 | Source: Ambulatory Visit | Attending: Radiation Oncology | Admitting: Radiation Oncology

## 2020-04-27 DIAGNOSIS — C321 Malignant neoplasm of supraglottis: Secondary | ICD-10-CM | POA: Diagnosis not present

## 2020-04-28 ENCOUNTER — Other Ambulatory Visit: Payer: Self-pay

## 2020-04-28 ENCOUNTER — Inpatient Hospital Stay: Payer: 59

## 2020-04-28 ENCOUNTER — Other Ambulatory Visit: Payer: Self-pay | Admitting: Hematology and Oncology

## 2020-04-28 ENCOUNTER — Ambulatory Visit
Admission: RE | Admit: 2020-04-28 | Discharge: 2020-04-28 | Disposition: A | Discharge status: Home / Self Care | Payer: 59 | Source: Ambulatory Visit | Attending: Radiation Oncology | Admitting: Radiation Oncology

## 2020-04-28 ENCOUNTER — Encounter: Payer: Self-pay | Admitting: Hematology and Oncology

## 2020-04-28 ENCOUNTER — Inpatient Hospital Stay (HOSPITAL_BASED_OUTPATIENT_CLINIC_OR_DEPARTMENT_OTHER): Payer: 59 | Admitting: Hematology and Oncology

## 2020-04-28 VITALS — BP 96/67 | HR 100 | Temp 98.8°F | Resp 20 | Wt 118.0 lb

## 2020-04-28 DIAGNOSIS — R432 Parageusia: Secondary | ICD-10-CM | POA: Diagnosis not present

## 2020-04-28 DIAGNOSIS — R634 Abnormal weight loss: Secondary | ICD-10-CM | POA: Diagnosis not present

## 2020-04-28 DIAGNOSIS — R110 Nausea: Secondary | ICD-10-CM

## 2020-04-28 DIAGNOSIS — T451X5A Adverse effect of antineoplastic and immunosuppressive drugs, initial encounter: Secondary | ICD-10-CM

## 2020-04-28 DIAGNOSIS — C321 Malignant neoplasm of supraglottis: Secondary | ICD-10-CM

## 2020-04-28 DIAGNOSIS — Z931 Gastrostomy status: Secondary | ICD-10-CM

## 2020-04-28 LAB — CBC WITH DIFFERENTIAL/PLATELET
Abs Immature Granulocytes: 0.03 10*3/uL (ref 0.00–0.07)
Basophils Absolute: 0 10*3/uL (ref 0.0–0.1)
Basophils Relative: 0 %
Eosinophils Absolute: 0.2 10*3/uL (ref 0.0–0.5)
Eosinophils Relative: 2 %
HCT: 40.4 % (ref 39.0–52.0)
Hemoglobin: 13.3 g/dL (ref 13.0–17.0)
Immature Granulocytes: 0 %
Lymphocytes Relative: 6 %
Lymphs Abs: 0.6 10*3/uL — ABNORMAL LOW (ref 0.7–4.0)
MCH: 29.8 pg (ref 26.0–34.0)
MCHC: 32.9 g/dL (ref 30.0–36.0)
MCV: 90.6 fL (ref 80.0–100.0)
Monocytes Absolute: 0.9 10*3/uL (ref 0.1–1.0)
Monocytes Relative: 9 %
Neutro Abs: 7.9 10*3/uL — ABNORMAL HIGH (ref 1.7–7.7)
Neutrophils Relative %: 83 %
Platelets: 246 10*3/uL (ref 150–400)
RBC: 4.46 MIL/uL (ref 4.22–5.81)
RDW: 13.8 % (ref 11.5–15.5)
WBC: 9.6 10*3/uL (ref 4.0–10.5)
nRBC: 0 % (ref 0.0–0.2)

## 2020-04-28 LAB — BASIC METABOLIC PANEL
Anion gap: 14 (ref 5–15)
BUN: 23 mg/dL — ABNORMAL HIGH (ref 6–20)
CO2: 27 mmol/L (ref 22–32)
Calcium: 9.5 mg/dL (ref 8.9–10.3)
Chloride: 94 mmol/L — ABNORMAL LOW (ref 98–111)
Creatinine, Ser: 0.91 mg/dL (ref 0.61–1.24)
GFR, Estimated: >60 mL/min (ref >60)
Glucose, Bld: 99 mg/dL (ref 70–99)
Potassium: 4.1 mmol/L (ref 3.5–5.1)
Sodium: 135 mmol/L (ref 135–145)

## 2020-04-28 LAB — MAGNESIUM: Magnesium: 1.9 mg/dL (ref 1.7–2.4)

## 2020-04-28 MED ORDER — SODIUM CHLORIDE 0.9 % IV SOLN
10.0000 mg | Freq: Once | INTRAVENOUS | Status: AC
  Administered 2020-04-28: 10 mg via INTRAVENOUS
  Filled 2020-04-28: qty 1

## 2020-04-28 MED ORDER — MAGNESIUM SULFATE 2 GM/50ML IV SOLN
INTRAVENOUS | Status: AC
  Filled 2020-04-28: qty 50

## 2020-04-28 MED ORDER — PALONOSETRON HCL INJECTION 0.25 MG/5ML
INTRAVENOUS | Status: AC
  Filled 2020-04-28: qty 5

## 2020-04-28 MED ORDER — PALONOSETRON HCL INJECTION 0.25 MG/5ML
0.2500 mg | Freq: Once | INTRAVENOUS | Status: AC
  Administered 2020-04-28: 0.25 mg via INTRAVENOUS

## 2020-04-28 MED ORDER — SODIUM CHLORIDE 0.9 % IV SOLN
150.0000 mg | Freq: Once | INTRAVENOUS | Status: AC
  Administered 2020-04-28: 150 mg via INTRAVENOUS
  Filled 2020-04-28: qty 150

## 2020-04-28 MED ORDER — SODIUM CHLORIDE 0.9 % IV SOLN: Freq: Once | INTRAVENOUS | Status: DC

## 2020-04-28 MED ORDER — SODIUM CHLORIDE 0.9 % IV SOLN
40.0000 mg/m2 | Freq: Once | INTRAVENOUS | Status: AC
  Administered 2020-04-28: 66 mg via INTRAVENOUS
  Filled 2020-04-28: qty 66

## 2020-04-28 MED ORDER — HEPARIN SOD (PORK) LOCK FLUSH 100 UNIT/ML IV SOLN
500.0000 [IU] | Freq: Once | INTRAVENOUS | Status: AC | PRN
  Administered 2020-04-28: 500 [IU]
  Filled 2020-04-28: qty 5

## 2020-04-28 MED ORDER — MAGNESIUM SULFATE 2 GM/50ML IV SOLN
2.0000 g | Freq: Once | INTRAVENOUS | Status: AC
  Administered 2020-04-28: 2 g via INTRAVENOUS

## 2020-04-28 MED ORDER — OSMOLITE 1.5 CAL PO LIQD: ORAL | 0 refills | Status: DC

## 2020-04-28 MED ORDER — POTASSIUM CHLORIDE IN NACL 20-0.9 MEQ/L-% IV SOLN
Freq: Once | INTRAVENOUS | Status: AC
  Filled 2020-04-28: qty 1000

## 2020-04-28 MED ORDER — SODIUM CHLORIDE 0.9 % IV SOLN
Freq: Once | INTRAVENOUS | Status: DC
  Filled 2020-04-28: qty 250

## 2020-04-28 MED ORDER — LORAZEPAM 0.5 MG PO TABS: 0.5000 mg | ORAL_TABLET | Freq: Four times a day (QID) | ORAL | 0 refills | Status: DC | PRN

## 2020-04-28 MED ORDER — SODIUM CHLORIDE 0.9% FLUSH
10.0000 mL | INTRAVENOUS | Status: DC | PRN
  Administered 2020-04-28: 10 mL
  Filled 2020-04-28: qty 10

## 2020-04-28 NOTE — Patient Instructions (Signed)
[facility] Discharge Instructions for Patients Receiving Chemotherapy  Today you received the following chemotherapy agents Cisplatin  To help prevent nausea and vomiting after your treatment, we encourage you to take your nausea medication as directed  If you develop nausea and vomiting that is not controlled by your nausea medication, call the clinic.   BELOW ARE SYMPTOMS THAT SHOULD BE REPORTED IMMEDIATELY:  *FEVER GREATER THAN 100.5 F  *CHILLS WITH OR WITHOUT FEVER  NAUSEA AND VOMITING THAT IS NOT CONTROLLED WITH YOUR NAUSEA MEDICATION  *UNUSUAL SHORTNESS OF BREATH  *UNUSUAL BRUISING OR BLEEDING  TENDERNESS IN MOUTH AND THROAT WITH OR WITHOUT PRESENCE OF ULCERS  *URINARY PROBLEMS  *BOWEL PROBLEMS  UNUSUAL RASH Items with * indicate a potential emergency and should be followed up as soon as possible.  Feel free to call the clinic should you have any questions or concerns. The clinic phone number is [phone].  Please show the CHEMO ALERT CARD at check-in to the Emergency Department and triage nurse.   

## 2020-04-28 NOTE — Addendum Note (Signed)
Addended by: Jennet Maduro B on: 04/28/2020 10:45 AM   Modules accepted: Orders

## 2020-04-28 NOTE — Assessment & Plan Note (Addendum)
[id number] Updated staging. He declined surgery. 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 If he has incomplete response with chemo radiation he will proceed with salvage surgery. He is now on week 3 of cisplatin ROS pertinent for fatigue, dysguesia, weight loss, and soreness in mouth and around G tube. PE likely contact dermatitis vs topical fungal infection around the G tube, no evidence of systemic infection. Labs reviewed, ok to proceed with treatment.

## 2020-04-28 NOTE — Progress Notes (Addendum)
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  Patient receiving concurrent chemotherapy and radiation.    Met with patient today in infusion.  Patient reports that has been trying to eat orally but over the weekend was not able to eat much.  Has been giving 1 carton of tube feeding until Monday and then gave 3 cartons and 4 cartons yesterday per patient.     Medications: reviewed  Labs: reviewed  Anthropometrics:   Weight 118 lb today decreased from 126 lb on 3/23.     Estimated Energy Needs  Kcals: 1700-2000 Protein: 85-100 g Fluid: 1.7 L  NUTRITION DIAGNOSIS: Unintentional weight loss continues   INTERVENTION:  RD had given hand written instructions last visit on patient goal of 6 cartons of osmolite 1.5 needed daily.  He was to start with 1 carton daily and as intake decreased to increase tube feeding.   Reviewed goal rate of tube feeding today with patient.  Hand written instructions given to patient today again.  Today patient to give 4 cartons of tube feeding with 67m water flush before and after.  He is to drinking or give additional 16 oz water via tube.  He is to increase tube feeding by 1/2 carton daily.  RD wrote tube feeding instructions for each day through Sunday 4/3 and reviewed with patient.  He verbalized understanding.  Tube feeding goal of 6 cartons per day (1 1/2 cartons QID with 669mwater flush before and after each feeding) and additional 16 oz bottle of water will provide 2130 calories, 89 g protein and 2060 ml free water.  Meets 100% of estimated energy needs.  RD will contact [employer] provide enteral nutrition formula and supplies to patient.  Additional case of formula and split gauze given to patient today.  Contact number given   MONITORING, EVALUATION, GOAL: weight trends, tube feeding, oral intake   NEXT VISIT: Wednesday, April 6 during infusion ([facility])  Erik [name] [name])

## 2020-04-28 NOTE — Assessment & Plan Note (Signed)
Chemotherapy induced nausea Advised to use compazine PRN as well as prescribed dexamethasone If nausea uncontrolled with compazine, then he can try ativan as needed. He needs a refill on ativan, will be sent.

## 2020-04-28 NOTE — Assessment & Plan Note (Signed)
Likely related to chemotherapy This will continue during the treatment and will improve after completion of treatment.

## 2020-04-28 NOTE — Progress Notes (Signed)
[name]  Patient Care Team: [name], NP as PCP - General (Nurse Practitioner) Melida Quitter, MD as Consulting Physician (Otolaryngology) Benay Pike, MD as Consulting Physician (Hematology and Oncology) Eppie Gibson, MD as Consulting Physician (Radiation Oncology) Malmfelt, Stephani Police, RN as Oncology Nurse Navigator  CHIEF COMPLAINTS/PURPOSE OF CONSULTATION:  Laryngeal cancer.  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 He declined surgery. 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 If he has incomplete response with chemo radiation he will proceed with salvage surgery. He is now on week 3 of cisplatin ROS pertinent for fatigue, dysguesia, weight loss, and soreness in mouth and around G tube. PE likely contact dermatitis vs topical fungal infection around the G tube, no evidence of systemic infection. Labs reviewed, ok to proceed with treatment.  Chemotherapy-induced nausea Chemotherapy induced nausea Advised to use compazine PRN as well as prescribed dexamethasone If nausea uncontrolled with compazine, then he can try ativan as needed. He needs a refill on ativan, will be sent.  Weight loss, unintentional Weight loss of 5 lbs since last visit, likely from ongoing treatment, poor oral intake and loss of appetite given dysguesia He is using the G tube, has a nutritionist visit today May have to take around 5 cans daily to maintain weight. Discussed use of free water in G tube as well to maintain hydration We will continue to monitor.  Dysgeusia Likely related to chemotherapy This will continue during the treatment and will improve after completion of treatment.  G tube feedings (HCC) Skin around G tube appears irritated and inflamed with possible topical fungal infection vs  contact dermatitis Could use barrier gauge to avoid irritation Could also try topical clotrimazole No evidence of systemic infection.  No orders of the defined types were placed in this encounter.  HISTORY OF PRESENTING ILLNESS:   Erik [name] 55 y.o. male is here because of new diagnosis of laryngeal cancer  Chronology  Pt had noticed ongoing hoarseness for almost 6 months, and was seen by a doctor who suggested that it may be related to GERD He used medication for GERD but his hoarseness continued to worsen. He then started getting SOB and went to the ER. He was found to have airway obstruction and underwent elective tracheostomy and direct laryngoscopy with biopsy.  Pertinent imaging and pathology results so far.  03/04/2020  Mucosal irregularity and mucosal/submucosal edema of the supraglottic and glottic larynx. Primary differential considerations include infectious/inflammatory laryngeal edema versus laryngeal malignancy. ENT consultation and direct visualization recommended. Associated mild-to-moderate narrowing, and mild rightward deviation, of the airway at this level.  Additional edema within the cutaneous/subcutaneous ventral neck.  Mildly enlarged left level 2/3 lymph node which may be reactive or could reflect metastatic nodal disease.  Nonspecific 1.4 cm part cystic/part solid cutaneous/subcutaneous lesion within the midline upper neck. Direct visualization recommended.  03/05/2020  FINAL MICROSCOPIC DIAGNOSIS:   A. LEFT VOCAL CORD, EXCISION:  - Invasive moderately differentiated squamous cell carcinoma, see  comment   B. RIGHT VOCAL CORD, EXCISION:  - Invasive moderately differentiated squamous cell carcinoma, see  comment   C. LEFT FALSE VOCAL CORD, EXCISION:  - Invasive moderately differentiated squamous cell carcinoma, see  comment   P 16 positive  INTERVAL HISTORY  He is here at the infusion in anticipation of C3 of cisplatin  He feels tired,  he says its not going as well as it did last week. Nausea without vomiting, looks like he is using ativan primarily, compazine as needed and dex as prescribed. Breathing is the same he says Some discomfort in his mouth, he is using lidocaine which helps for a very brief period. He denies any other change in bowel habits. G tube continues to hurt. He is not drinking as much water,  No fevers or chills, neuropathy, change in hearing.  REVIEW OF SYSTEMS:   Constitutional: Denies fevers, chills or abnormal night sweats Eyes: Denies blurriness of vision, double vision or watery eyes Ears, nose, mouth, throat, and face: As mentioned above respiratory: Denies cough, dyspnea or wheezes Cardiovascular: Denies palpitation, chest discomfort or lower extremity swelling Gastrointestinal:  Denies nausea, heartburn or change in bowel habits Skin: Denies abnormal skin rashes Lymphatics: Denies new lymphadenopathy or easy bruising Neurological:Denies numbness, tingling or new weaknesses Behavioral/Psych: Mood is stable, no new changes  All other systems were reviewed with the patient and are negative.  MEDICAL HISTORY:  Past Medical History:  Diagnosis Date  . Coronary artery disease   . Dyspnea   . GERD (gastroesophageal reflux disease)    [employer] 02/21/2020  . Ruptured lumbar intervertebral disc     SURGICAL HISTORY: Past Surgical History:  Procedure Laterality Date  . CARDIAC CATHETERIZATION     with stent placement  . IR GASTROSTOMY TUBE MOD SED  04/20/2020  . IR IMAGING GUIDED PORT INSERTION  04/20/2020  . TONSILLECTOMY    . TRACHEOSTOMY TUBE PLACEMENT N/A 03/05/2020   Procedure: AWAKE TRACHEOSTOMY, DIRECT LARYNGOSCOPY WITH BIOPSY;  Surgeon: Melida Quitter, MD;  Location: [facility];  Service: ENT;  Laterality: N/A;    SOCIAL HISTORY: Social History   Socioeconomic History  . Marital status: Married    Spouse name: Not on file  . Number of children: Not on file  . Years of education:  Not on file  . Highest education level: Not on file  Occupational History  . Not on file  Tobacco Use  . Smoking status: Current Every Day Smoker    Types: Cigarettes  . Smokeless tobacco: Former Systems developer    Types: Secondary school teacher  . Vaping Use: Former  Substance and Sexual Activity  . Alcohol use: No  . Drug use: No  . Sexual activity: Yes  Other Topics Concern  . Not on file  Social History Narrative  . Not on file   Social Determinants of Health   Financial Resource Strain: Not on file  Food Insecurity: Not on file  Transportation Needs: No Transportation Needs  . Lack of Transportation (Medical): No  . Lack of Transportation (Non-Medical): No  Physical Activity: Not on file  Stress: Not on file  Social Connections: Not on file  Intimate Partner Violence: Not on file    FAMILY HISTORY: No family history on file.  ALLERGIES:  has No Known Allergies.  MEDICATIONS:  Current Outpatient Medications  Medication Sig Dispense Refill  . dexamethasone (DECADRON) 4 MG tablet Take 2 tablets (8 mg total) by mouth daily. Take daily x 3 days starting the day after cisplatin chemotherapy. Take with food. 30 tablet 1  . ibuprofen (ADVIL,MOTRIN) 200 MG tablet Take 800 mg by mouth every 8 (eight) hours as needed for mild pain. For pain    . lidocaine (XYLOCAINE) 2 % solution Patient: Mix 1part 2% viscous lidocaine, 1part H20. Swallow 61mL of diluted mixture, 70min before meals and at bedtime,  up to QID 200 mL 4  . lidocaine-prilocaine (EMLA) cream Apply to affected area once 30 g 3  . LORazepam (ATIVAN) 0.5 MG tablet Take 1 tablet (0.5 mg total) by mouth every 6 (six) hours as needed (Nausea or vomiting). 30 tablet 0  . Multiple Vitamin (MULTIVITAMIN WITH MINERALS) TABS tablet Take 1 tablet by mouth daily.    . ondansetron (ZOFRAN) 8 MG tablet Take 1 tablet (8 mg total) by mouth 2 (two) times daily as needed. Start on the third day after cisplatin chemotherapy. 30 tablet 1  .  prochlorperazine (COMPAZINE) 10 MG tablet Take 1 tablet (10 mg total) by mouth every 6 (six) hours as needed (Nausea or vomiting). 30 tablet 1   No current facility-administered medications for this visit.   Facility-Administered Medications Ordered in Other Visits  Medication Dose Route Frequency Provider Last Rate Last Admin  . 0.9 %  sodium chloride infusion   Intravenous Once Lillian Tigges, MD      . dexamethasone (DECADRON) 10 mg in sodium chloride 0.9 % 50 mL IVPB  10 mg Intravenous Once Kooper Chriswell, MD      . fosaprepitant (EMEND) 150 mg in sodium chloride 0.9 % 145 mL IVPB  150 mg Intravenous Once Muaz Shorey, MD      . heparin lock flush 100 unit/mL  500 Units Intracatheter Once PRN Khiyan Crace, MD      . magnesium sulfate IVPB 2 g 50 mL  2 g Intravenous Once Benay Pike, MD 50 mL/hr at 04/28/20 0925 2 g at 04/28/20 0925  . palonosetron (ALOXI) injection 0.25 mg  0.25 mg Intravenous Once Gregorey Nabor, MD      . sodium chloride flush (NS) 0.9 % injection 10 mL  10 mL Intracatheter PRN Wylan Gentzler, MD         PHYSICAL EXAMINATION:  ECOG PERFORMANCE STATUS: 0 - Asymptomatic  There were no vitals filed for this visit. There were no vitals filed for this visit.  GENERAL:alert, no distress and comfortable NECK:  Tracheostomy in place,   trach site without any overt signs of infection. LYMPH: No obvious palpable lymphadenopathy LUNGS: scattered bronchial breathing HEART: regular rate & rhythm and no murmurs and no lower extremity edema ABDOMEN:abdomen soft, non-tender and normal bowel sounds, G tube site looks irritated likely contact dermatitis vs topical fungal infection, no purulent drainage. Musculoskeletal:no cyanosis of digits and no clubbing  PSYCH: alert & oriented x 3 with fluent speech NEURO: no focal motor/sensory deficits  LABORATORY DATA:  I have reviewed the data as listed Lab Results  Component Value Date   WBC 9.6 04/28/2020   HGB 13.3  04/28/2020   HCT 40.4 04/28/2020   MCV 90.6 04/28/2020   PLT 246 04/28/2020     Chemistry      Component Value Date/Time   NA 135 04/28/2020 0802   K 4.1 04/28/2020 0802   CL 94 (L) 04/28/2020 0802   CO2 27 04/28/2020 0802   BUN 23 (H) 04/28/2020 0802   CREATININE 0.91 04/28/2020 0802   CREATININE 0.79 03/23/2020 1013      Component Value Date/Time   CALCIUM 9.5 04/28/2020 0802   ALKPHOS 88 03/23/2020 1013   AST 24 03/23/2020 1013   ALT 41 03/23/2020 1013   BILITOT 0.3 03/23/2020 1013     CBC reviewed, mild leukocytosis. BMP reviewed, no evidence of creatinine impairment Magnesium of 1.7.  RADIOGRAPHIC STUDIES: I have personally reviewed the radiological images as listed and agreed with the findings  in the report. IR Gastrostomy Tube  Result Date: 04/20/2020 INDICATION: Laryngeal malignancy EXAM: Percutaneous gastrostomy tube placement MEDICATIONS: Ancef 2 g IV; Antibiotics were administered within 1 hour of the procedure. Glucagon 1 mg IV ANESTHESIA/SEDATION: Versed 3 mg IV; Fentanyl 100 mcg IV Moderate Sedation Time:  20 The patient was continuously monitored during the procedure by the interventional radiology nurse under my direct supervision. CONTRAST:  10 mL of Omnipaque-administered into the gastric lumen. FLUOROSCOPY TIME:  Fluoroscopy Time: 3 minutes 30 seconds (28 mGy). COMPLICATIONS: None immediate. PROCEDURE: Informed written consent was obtained from the patient after a thorough discussion of the procedural risks, benefits and alternatives. All questions were addressed. Maximal Sterile Barrier Technique was utilized including caps, mask, sterile gowns, sterile gloves, sterile drape, hand hygiene and skin antiseptic. A timeout was performed prior to the initiation of the procedure. The left hepatic lobe margin was marked utilizing ultrasound guidance. The epigastric region was prepped and draped in the usual sterile fashion. The stomach was insufflated utilizing the NG  tube. Following local lidocaine administration, three gastropexies were placed to secure the anterior wall of the stomach to the anterior abdominal wall. Percutaneous access obtained into the gastric antrum at the center of the gastropexies with an 18 gauge needle. Guide wire advanced into the gastric lumen. Gastrostomy tube placed on the guidewire. 10 mm balloon loaded on the guidewire and advanced through the gastrostomy tube. 10 mm balloon insufflated in the anterior abdominal/gastric wall and both balloon and 16 French gastrostomy tube inserted into the gastric lumen. The 10 mm balloon was removed. The G tube retention balloon was inflated with 7 mL of dilute contrast and retracted to the anterior gastric wall. Contrast administrated through the gastrostomy tube opacified the gastric lumen. The insertion site was covered with sterile dressing. IMPRESSION: Fluoroscopy guided percutaneous gastrostomy tube placement as above. Electronically Signed   By: Miachel Roux M.D.   On: 04/20/2020 15:55   NM PET Image Initial (PI) Skull Base To Thigh  Result Date: 04/05/2020 CLINICAL DATA:  Initial treatment strategy for head neck cancer. EXAM: NUCLEAR MEDICINE PET SKULL BASE TO THIGH TECHNIQUE: 6.0 mCi F-18 FDG was injected intravenously. Full-ring PET imaging was performed from the skull base to thigh after the radiotracer. CT data was obtained and used for attenuation correction and anatomic localization. Fasting blood glucose: 82 mg/dl COMPARISON:  Neck CT 03/04/2020 FINDINGS: Mediastinal blood pool activity: SUV max 1.8 Liver activity: SUV max NA NECK: Marked hypermetabolism is identified in the glottic region, compatible with the patient's known primary. SUV max = 17.3. The left-sided level II lymph node identified on diagnostic neck CT shows low level hypermetabolism with SUV max = 2.0. Activity along the patient's tracheostomy tube is compatible with uptake related to scarring/granulation. The 1.4 cm part  cystic/part solid cutaneous/subcutaneous lesion described in the midline posterior upper neck on previous CT shows no hypermetabolism. Incidental CT findings: none CHEST: 9 mm ground-glass opacity in the posterior right upper lobe (84/4) shows low level FDG uptake with SUV max = 1.1. No hypermetabolic lymphadenopathy in the chest. Incidental CT findings: Coronary artery calcification is evident. Atherosclerotic calcification is noted in the wall of the thoracic aorta. Centrilobular emphysema noted. ABDOMEN/PELVIS: No abnormal hypermetabolic activity within the liver, pancreas, adrenal glands, or spleen. No hypermetabolic lymph nodes in the abdomen or pelvis. Isolated focus of uptake anteromedial to the left psoas muscle is likely excreted radiotracer within the ureter. Incidental CT findings: There is abdominal aortic atherosclerosis without aneurysm. SKELETON: No  focal hypermetabolic activity to suggest skeletal metastasis. Incidental CT findings: none IMPRESSION: 1. Marked hypermetabolism in the glottic region, consistent with the patient's known primary neoplasm. 2. Left-sided level II lymph node described on previous diagnostic neck CT shows low level FDG accumulation, raising concern for metastatic involvement. 3. No evidence for hypermetabolic metastatic disease in the chest, abdomen, or pelvis. 4. 9 mm ground-glass opacity in the posterior right upper lobe with low level FDG uptake. This may be infectious/inflammatory. Continued attention on follow-up recommended. 5.  Aortic Atherosclerois (ICD10-170.0) 6.  Emphysema. ([id number]) Electronically Signed   By: Misty Stanley M.D.   On: 04/05/2020 15:18   IR IMAGING GUIDED PORT INSERTION  Result Date: 04/20/2020 INDICATION: Nasopharyngeal carcinoma EXAM: IMPLANTED PORT A CATH PLACEMENT WITH ULTRASOUND AND FLUOROSCOPIC GUIDANCE MEDICATIONS: None ANESTHESIA/SEDATION: Moderate (conscious) sedation was employed during this procedure. A total of Versed 3 mg and  Fentanyl 100 mcg was administered intravenously. Moderate Sedation Time: 21 minutes. The patient's level of consciousness and vital signs were monitored continuously by radiology nursing throughout the procedure under my direct supervision. FLUOROSCOPY TIME:  0 minutes, 45 seconds (3 mGy) COMPLICATIONS: None immediate. PROCEDURE: The procedure, risks, benefits, and alternatives were explained to the patient. Questions regarding the procedure were encouraged and answered. The patient understands and consents to the procedure. A timeout was performed prior to the initiation of the procedure. Patient positioned supine on the angiography table. Right neck and anterior upper chest prepped and draped in the usual sterile fashion. All elements of maximal sterile barrier were utilized including, cap, mask, sterile gown, sterile gloves, large sterile drape, hand scrubbing and 2% Chlorhexidine for skin cleaning. The right internal jugular vein was evaluated with ultrasound and shown to be patent. A permanent ultrasound image was obtained and placed in the patient's medical record. Local anesthesia was provided with 1% lidocaine with epinephrine. Using sterile gel and a sterile probe cover, the right internal jugular vein was entered with a 21 ga needle during real time ultrasound guidance. 0.018 inch guidewire placed and 21 ga needle exchanged for transitional dilator set. Utilizing fluoroscopy, 0.035 inch guidewire advanced through the needle without difficulty. Attention then turned to the right anterior upper chest. Following local lidocaine administration, a port pocket was created. The catheter was connected to the port and brought from the pocket to the venotomy site through a subcutaneous tunnel. The catheter was cut to size and inserted through the peel-away sheath. The catheter tip was positioned at the cavoatrial junction using fluoroscopic guidance. The port aspirated and flushed well. The port pocket was closed  with deep and superficial absorbable suture. The port pocket incision and venotomy sites were also sealed with Dermabond. IMPRESSION: Successful placement of a right internal jugular approach power injectable Port-A-Cath. The catheter is ready for immediate use. Electronically Signed   By: Miachel Roux M.D.   On: 04/20/2020 15:50    All questions were answered. The patient knows to call the clinic with any problems, questions or concerns. I spent 40 minutes in the care of this patient including H and P, review of records, counseling and coordination of care.     Benay Pike, MD 04/28/2020 9:54 AM

## 2020-04-28 NOTE — Assessment & Plan Note (Signed)
Skin around G tube appears irritated and inflamed with possible topical fungal infection vs contact dermatitis Could use barrier gauge to avoid irritation Could also try topical clotrimazole No evidence of systemic infection.

## 2020-04-28 NOTE — Assessment & Plan Note (Signed)
Weight loss of 5 lbs since last visit, likely from ongoing treatment, poor oral intake and loss of appetite given dysguesia He is using the G tube, has a nutritionist visit today May have to take around 5 cans daily to maintain weight. Discussed use of free water in G tube as well to maintain hydration We will continue to monitor.

## 2020-04-29 ENCOUNTER — Ambulatory Visit
Admission: RE | Admit: 2020-04-29 | Discharge: 2020-04-29 | Disposition: A | Discharge status: Home / Self Care | Payer: 59 | Source: Ambulatory Visit | Attending: Radiation Oncology | Admitting: Radiation Oncology

## 2020-04-29 ENCOUNTER — Other Ambulatory Visit: Payer: Self-pay

## 2020-04-29 DIAGNOSIS — C321 Malignant neoplasm of supraglottis: Secondary | ICD-10-CM | POA: Diagnosis not present

## 2020-04-30 ENCOUNTER — Other Ambulatory Visit (HOSPITAL_COMMUNITY): Payer: 59

## 2020-04-30 ENCOUNTER — Ambulatory Visit
Admission: RE | Admit: 2020-04-30 | Discharge: 2020-04-30 | Disposition: A | Discharge status: Home / Self Care | Payer: 59 | Source: Ambulatory Visit | Attending: Radiation Oncology | Admitting: Radiation Oncology

## 2020-04-30 DIAGNOSIS — C321 Malignant neoplasm of supraglottis: Secondary | ICD-10-CM | POA: Diagnosis present

## 2020-05-03 ENCOUNTER — Other Ambulatory Visit: Payer: Self-pay

## 2020-05-03 ENCOUNTER — Ambulatory Visit
Admission: RE | Admit: 2020-05-03 | Discharge: 2020-05-03 | Disposition: A | Discharge status: Home / Self Care | Payer: 59 | Source: Ambulatory Visit | Attending: Radiation Oncology | Admitting: Radiation Oncology

## 2020-05-04 ENCOUNTER — Ambulatory Visit
Admission: RE | Admit: 2020-05-04 | Discharge: 2020-05-04 | Disposition: A | Discharge status: Home / Self Care | Payer: 59 | Source: Ambulatory Visit | Attending: Radiation Oncology | Admitting: Radiation Oncology

## 2020-05-05 ENCOUNTER — Inpatient Hospital Stay: Payer: 59 | Admitting: Nutrition

## 2020-05-05 ENCOUNTER — Other Ambulatory Visit (HOSPITAL_COMMUNITY): Payer: Self-pay

## 2020-05-05 ENCOUNTER — Ambulatory Visit
Admission: RE | Admit: 2020-05-05 | Discharge: 2020-05-05 | Disposition: A | Discharge status: Home / Self Care | Payer: 59 | Source: Ambulatory Visit | Attending: Radiation Oncology | Admitting: Radiation Oncology

## 2020-05-05 ENCOUNTER — Other Ambulatory Visit: Payer: Self-pay

## 2020-05-05 ENCOUNTER — Inpatient Hospital Stay (HOSPITAL_BASED_OUTPATIENT_CLINIC_OR_DEPARTMENT_OTHER): Payer: 59 | Admitting: Hematology and Oncology

## 2020-05-05 ENCOUNTER — Inpatient Hospital Stay: Payer: 59 | Attending: Hematology and Oncology

## 2020-05-05 ENCOUNTER — Inpatient Hospital Stay: Payer: 59

## 2020-05-05 DIAGNOSIS — Z7952 Long term (current) use of systemic steroids: Secondary | ICD-10-CM | POA: Diagnosis not present

## 2020-05-05 DIAGNOSIS — Z79899 Other long term (current) drug therapy: Secondary | ICD-10-CM | POA: Diagnosis not present

## 2020-05-05 DIAGNOSIS — C321 Malignant neoplasm of supraglottis: Secondary | ICD-10-CM | POA: Insufficient documentation

## 2020-05-05 DIAGNOSIS — I2510 Atherosclerotic heart disease of native coronary artery without angina pectoris: Secondary | ICD-10-CM | POA: Insufficient documentation

## 2020-05-05 DIAGNOSIS — F17210 Nicotine dependence, cigarettes, uncomplicated: Secondary | ICD-10-CM | POA: Insufficient documentation

## 2020-05-05 DIAGNOSIS — Z931 Gastrostomy status: Secondary | ICD-10-CM | POA: Insufficient documentation

## 2020-05-05 DIAGNOSIS — K1231 Oral mucositis (ulcerative) due to antineoplastic therapy: Secondary | ICD-10-CM | POA: Insufficient documentation

## 2020-05-05 DIAGNOSIS — R490 Dysphonia: Secondary | ICD-10-CM | POA: Insufficient documentation

## 2020-05-05 DIAGNOSIS — Z5111 Encounter for antineoplastic chemotherapy: Secondary | ICD-10-CM | POA: Diagnosis not present

## 2020-05-05 DIAGNOSIS — T451X5A Adverse effect of antineoplastic and immunosuppressive drugs, initial encounter: Secondary | ICD-10-CM | POA: Diagnosis not present

## 2020-05-05 DIAGNOSIS — Z791 Long term (current) use of non-steroidal anti-inflammatories (NSAID): Secondary | ICD-10-CM | POA: Diagnosis not present

## 2020-05-05 DIAGNOSIS — R634 Abnormal weight loss: Secondary | ICD-10-CM | POA: Insufficient documentation

## 2020-05-05 DIAGNOSIS — D72819 Decreased white blood cell count, unspecified: Secondary | ICD-10-CM | POA: Insufficient documentation

## 2020-05-05 DIAGNOSIS — R5382 Chronic fatigue, unspecified: Secondary | ICD-10-CM | POA: Diagnosis not present

## 2020-05-05 DIAGNOSIS — K219 Gastro-esophageal reflux disease without esophagitis: Secondary | ICD-10-CM | POA: Insufficient documentation

## 2020-05-05 LAB — CBC WITH DIFFERENTIAL/PLATELET
Abs Immature Granulocytes: 0.02 10*3/uL (ref 0.00–0.07)
Basophils Absolute: 0 10*3/uL (ref 0.0–0.1)
Basophils Relative: 0 %
Eosinophils Absolute: 0.1 10*3/uL (ref 0.0–0.5)
Eosinophils Relative: 1 %
HCT: 36.4 % — ABNORMAL LOW (ref 39.0–52.0)
Hemoglobin: 12 g/dL — ABNORMAL LOW (ref 13.0–17.0)
Immature Granulocytes: 0 %
Lymphocytes Relative: 5 %
Lymphs Abs: 0.3 10*3/uL — ABNORMAL LOW (ref 0.7–4.0)
MCH: 30.8 pg (ref 26.0–34.0)
MCHC: 33 g/dL (ref 30.0–36.0)
MCV: 93.3 fL (ref 80.0–100.0)
Monocytes Absolute: 0.8 10*3/uL (ref 0.1–1.0)
Monocytes Relative: 11 %
Neutro Abs: 5.4 10*3/uL (ref 1.7–7.7)
Neutrophils Relative %: 83 %
Platelets: 257 10*3/uL (ref 150–400)
RBC: 3.9 MIL/uL — ABNORMAL LOW (ref 4.22–5.81)
RDW: 13.9 % (ref 11.5–15.5)
WBC: 6.6 10*3/uL (ref 4.0–10.5)
nRBC: 0 % (ref 0.0–0.2)

## 2020-05-05 LAB — BASIC METABOLIC PANEL
Anion gap: 13 (ref 5–15)
BUN: 23 mg/dL — ABNORMAL HIGH (ref 6–20)
CO2: 28 mmol/L (ref 22–32)
Calcium: 9.3 mg/dL (ref 8.9–10.3)
Chloride: 96 mmol/L — ABNORMAL LOW (ref 98–111)
Creatinine, Ser: 0.93 mg/dL (ref 0.61–1.24)
GFR, Estimated: >60 mL/min (ref >60)
Glucose, Bld: 98 mg/dL (ref 70–99)
Potassium: 4.6 mmol/L (ref 3.5–5.1)
Sodium: 137 mmol/L (ref 135–145)

## 2020-05-05 LAB — MAGNESIUM: Magnesium: 2 mg/dL (ref 1.7–2.4)

## 2020-05-05 MED ORDER — PALONOSETRON HCL INJECTION 0.25 MG/5ML
0.2500 mg | Freq: Once | INTRAVENOUS | Status: AC
  Administered 2020-05-05: 0.25 mg via INTRAVENOUS

## 2020-05-05 MED ORDER — MAGNESIUM SULFATE 2 GM/50ML IV SOLN
2.0000 g | Freq: Once | INTRAVENOUS | Status: AC
  Administered 2020-05-05: 2 g via INTRAVENOUS

## 2020-05-05 MED ORDER — PALONOSETRON HCL INJECTION 0.25 MG/5ML
INTRAVENOUS | Status: AC
  Filled 2020-05-05: qty 5

## 2020-05-05 MED ORDER — MAGNESIUM SULFATE 2 GM/50ML IV SOLN
INTRAVENOUS | Status: AC
  Filled 2020-05-05: qty 50

## 2020-05-05 MED ORDER — HEPARIN SOD (PORK) LOCK FLUSH 100 UNIT/ML IV SOLN
500.0000 [IU] | Freq: Once | INTRAVENOUS | Status: AC | PRN
  Administered 2020-05-05: 500 [IU]
  Filled 2020-05-05: qty 5

## 2020-05-05 MED ORDER — SODIUM CHLORIDE 0.9 % IV SOLN
Freq: Once | INTRAVENOUS | Status: AC
  Filled 2020-05-05: qty 250

## 2020-05-05 MED ORDER — HYDROCODONE-ACETAMINOPHEN 7.5-325 MG/15ML PO SOLN
10.0000 mL | Freq: Four times a day (QID) | ORAL | 0 refills | Status: DC | PRN
  Filled 2020-05-05: qty 120, 3d supply, fill #0

## 2020-05-05 MED ORDER — SODIUM CHLORIDE 0.9% FLUSH
10.0000 mL | INTRAVENOUS | Status: DC | PRN
  Administered 2020-05-05: 10 mL
  Filled 2020-05-05: qty 10

## 2020-05-05 MED ORDER — SODIUM CHLORIDE 0.9 % IV SOLN
40.0000 mg/m2 | Freq: Once | INTRAVENOUS | Status: AC
  Administered 2020-05-05: 66 mg via INTRAVENOUS
  Filled 2020-05-05: qty 66

## 2020-05-05 MED ORDER — FOSAPREPITANT DIMEGLUMINE INJECTION 150 MG
150.0000 mg | Freq: Once | INTRAVENOUS | Status: AC
Start: 2020-05-05 — End: 2020-05-05
  Administered 2020-05-05: 150 mg via INTRAVENOUS
  Filled 2020-05-05: qty 150

## 2020-05-05 MED ORDER — SODIUM CHLORIDE 0.9 % IV SOLN
10.0000 mg | Freq: Once | INTRAVENOUS | Status: AC
  Administered 2020-05-05: 10 mg via INTRAVENOUS
  Filled 2020-05-05: qty 10

## 2020-05-05 MED ORDER — POTASSIUM CHLORIDE IN NACL 20-0.9 MEQ/L-% IV SOLN
Freq: Once | INTRAVENOUS | Status: AC
Start: 2020-05-05 — End: 2020-05-05
  Filled 2020-05-05: qty 1000

## 2020-05-05 NOTE — Patient Instructions (Signed)
[facility] Discharge Instructions for Patients Receiving Chemotherapy  We will see you back here tomorrow at 8:15 for radiation, speech therapy, and physical therapy :)  Today you received the following chemotherapy agents: Cisplatin   To help prevent nausea and vomiting after your treatment, we encourage you to take your nausea medication  as prescribed.    If you develop nausea and vomiting that is not controlled by your nausea medication, call the clinic.   BELOW ARE SYMPTOMS THAT SHOULD BE REPORTED IMMEDIATELY:  *FEVER GREATER THAN 100.5 F  *CHILLS WITH OR WITHOUT FEVER  NAUSEA AND VOMITING THAT IS NOT CONTROLLED WITH YOUR NAUSEA MEDICATION  *UNUSUAL SHORTNESS OF BREATH  *UNUSUAL BRUISING OR BLEEDING  TENDERNESS IN MOUTH AND THROAT WITH OR WITHOUT PRESENCE OF ULCERS  *URINARY PROBLEMS  *BOWEL PROBLEMS  UNUSUAL RASH Items with * indicate a potential emergency and should be followed up as soon as possible.  Feel free to call the clinic should you have any questions or concerns. The clinic phone number is [phone].  Please show the [facility] at check-in to the Emergency Department and triage nurse.

## 2020-05-05 NOTE — Progress Notes (Signed)
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  Patient is receiving concurrent chemoradiation therapy. Weight decreased to 115.9 pounds on April 6 down from 118 pounds March 30. Patient just reached tube feeding goal rate of 6 cartons of Osmolite 1.5 as of Sunday.  He denies issues with tolerance.  He is flushing his feeding tube with 60 mL of water before and after bolus feedings.  He is giving an additional 16 ounces of water via tube. 6 cartons Osmolite 1.5 daily plus free water flushes provides 2130 cal, 89.4 g protein, 2060 mL free water.  This is 100% estimated nutrition needs. Patient reports he has plenty of tube feeding and supplies which were delivered by [facility]. Noted labs: Magnesium 2.0, potassium 4.6, BUN 23, glucose 98.  Estimated nutrition needs: 1700-2000 cal, 85-100 g protein, 1.7 L fluid.  Nutrition diagnosis: Unintentional weight loss continues.  Intervention: Continue 1 1/2 cartons Osmolite 1.5 4 times daily via PEG with 60 mL free water before and after bolus feeding.  Continue an additional 16 ounces of water throughout the day to provide additional fluid. Encouraged oral intake by mouth as tolerated. Educated patient to contact [facility] for tube feeding supplies.  Monitoring, evaluation, goals: Patient will tolerate tube feeding to meet estimated nutrition needs to minimize weight loss.  Next visit: Wednesday, April 13 during infusion.  **Disclaimer: This note was dictated with voice recognition software. Similar sounding words can inadvertently be transcribed and this note may contain transcription errors which may not have been corrected upon publication of note.**

## 2020-05-05 NOTE — Progress Notes (Signed)
[place] NOTE  Patient Care Team: [name], NP as PCP - General (Nurse Practitioner) Melida Quitter, MD as Consulting Physician (Otolaryngology) Benay Pike, MD as Consulting Physician (Hematology and Oncology) Eppie Gibson, MD as Consulting Physician (Radiation Oncology) Malmfelt, Stephani Police, RN as Oncology Nurse Navigator  CHIEF COMPLAINTS/PURPOSE OF CONSULTATION:  Laryngeal cancer.  ASSESSMENT & PLAN:   Malignant neoplasm of supraglottis ([place]) [id number] Updated staging. He declined surgery. 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 If he has incomplete response with chemo radiation he will proceed with salvage surgery. He is now on week 5 of cisplatin ROS significant for severe pain in his mouth. PE no concerns Labs reviewed, ok to proceed with treatment.  Mucositis due to antineoplastic therapy Moderate to severe causing significant discomfort. Will recommend liquid hydrocodone for pain management Refill sent to [facility]  Weight loss, unintentional Lost 3 lbs since last visit Encouraged him to use G tube for feeding Continue FU with nutrition  No orders of the defined types were placed in this encounter.  HISTORY OF PRESENTING ILLNESS:   Erik [name] 55 y.o. [name] is here because of new diagnosis of laryngeal cancer  Chronology  Pt had noticed ongoing hoarseness for almost 6 months, and was seen by a doctor who suggested that it may be related to GERD He used medication for GERD but his hoarseness continued to worsen. He then started getting SOB and went to the ER. He was found to have airway obstruction and underwent elective tracheostomy and direct laryngoscopy with biopsy.  Pertinent imaging and pathology results so far.  03/04/2020  Mucosal irregularity and mucosal/submucosal edema of the supraglottic  and glottic larynx. Primary differential considerations include infectious/inflammatory laryngeal edema versus laryngeal malignancy. ENT consultation and direct visualization recommended. Associated mild-to-moderate narrowing, and mild rightward deviation, of the airway at this level.  Additional edema within the cutaneous/subcutaneous ventral neck.  Mildly enlarged left level 2/3 lymph node which may be reactive or could reflect metastatic nodal disease.  Nonspecific 1.4 cm part cystic/part solid cutaneous/subcutaneous lesion within the midline upper neck. Direct visualization recommended.  03/05/2020  FINAL MICROSCOPIC DIAGNOSIS:   A. LEFT VOCAL CORD, EXCISION:  - Invasive moderately differentiated squamous cell carcinoma, see  comment   B. RIGHT VOCAL CORD, EXCISION:  - Invasive moderately differentiated squamous cell carcinoma, see  comment   C. LEFT FALSE VOCAL CORD, EXCISION:  - Invasive moderately differentiated squamous cell carcinoma, see  comment   P 16 positive  INTERVAL HISTORY  He is now on weekly cisplatin. He feels tired over all, but otherwise feels like his breathing is significantly better. He has noticed pretty severe pain in his mouth and throat. It is about 8/10 in intensity.  Besides pain and fatigue, he denies any other complaints. No change in breathing, bowel habits or urinary habits. No new neurological complaints. No fevers or chills, neuropathy, change in hearing. He is trying to push water mostly through G tube Rest of the pertinent 10 point ROS reviewed and negative.  MEDICAL HISTORY:  Past Medical History:  Diagnosis Date  . Coronary artery disease   . Dyspnea   . GERD (gastroesophageal reflux disease)    Phreesia 02/21/2020  . Ruptured lumbar intervertebral disc     SURGICAL HISTORY: Past Surgical History:  Procedure Laterality Date  . CARDIAC CATHETERIZATION  with stent placement  . IR GASTROSTOMY TUBE MOD SED   04/20/2020  . IR IMAGING GUIDED PORT INSERTION  04/20/2020  . TONSILLECTOMY    . TRACHEOSTOMY TUBE PLACEMENT N/A 03/05/2020   Procedure: AWAKE TRACHEOSTOMY, DIRECT LARYNGOSCOPY WITH BIOPSY;  Surgeon: Melida Quitter, MD;  Location: [facility];  Service: ENT;  Laterality: N/A;    SOCIAL HISTORY: Social History   Socioeconomic History  . Marital status: Married    Spouse name: Not on file  . Number of children: Not on file  . Years of education: Not on file  . Highest education level: Not on file  Occupational History  . Not on file  Tobacco Use  . Smoking status: Current Every Day Smoker    Types: Cigarettes  . Smokeless tobacco: Former Systems developer    Types: Secondary school teacher  . Vaping Use: Former  Substance and Sexual Activity  . Alcohol use: No  . Drug use: No  . Sexual activity: Yes  Other Topics Concern  . Not on file  Social History Narrative  . Not on file   Social Determinants of Health   Financial Resource Strain: High Risk  . Difficulty of Paying Living Expenses: Hard  Food Insecurity: Food Insecurity Present  . Worried About Charity fundraiser in the Last Year: Sometimes true  . Ran Out of Food in the Last Year: Sometimes true  Transportation Needs: No Transportation Needs  . Lack of Transportation (Medical): No  . Lack of Transportation (Non-Medical): No  Physical Activity: Not on file  Stress: Not on file  Social Connections: Not on file  Intimate Partner Violence: Not on file    FAMILY HISTORY: No family history on file.  ALLERGIES:  has No Known Allergies.  MEDICATIONS:  Current Outpatient Medications  Medication Sig Dispense Refill  . HYDROcodone-acetaminophen (HYCET) 7.5-325 mg/15 ml solution Take 10 mLs by mouth every 6 (six) hours as needed for moderate pain or severe pain (on concurrent chemoradiation). 120 mL 0  . dexamethasone (DECADRON) 4 MG tablet Take 2 tablets (8 mg total) by mouth daily. Take daily x 3 days starting the day after cisplatin  chemotherapy. Take with food. 30 tablet 1  . ibuprofen (ADVIL,MOTRIN) 200 MG tablet Take 800 mg by mouth every 8 (eight) hours as needed for mild pain. For pain    . lidocaine (XYLOCAINE) 2 % solution Patient: Mix 1part 2% viscous lidocaine, 1part H20. Swallow 75mL of diluted mixture, 5min before meals and at bedtime, up to QID 200 mL 4  . lidocaine-prilocaine (EMLA) cream Apply to affected area once 30 g 3  . LORazepam (ATIVAN) 0.5 MG tablet Take 1 tablet (0.5 mg total) by mouth every 6 (six) hours as needed (Nausea or vomiting). 30 tablet 0  . Multiple Vitamin (MULTIVITAMIN WITH MINERALS) TABS tablet Take 1 tablet by mouth daily.    . Nutritional Supplements (FEEDING SUPPLEMENT, OSMOLITE 1.5 CAL,) LIQD Give 1 1/2 cartons of tube feeding at 8am, noon, 4pm and 8pm via tube.  Flush with 8ml of water before and after each feeding.  Must drink or give via tube 16oz bottle water for additional hydration. 1422 mL 0  . ondansetron (ZOFRAN) 8 MG tablet Take 1 tablet (8 mg total) by mouth 2 (two) times daily as needed. Start on the third day after cisplatin chemotherapy. 30 tablet 1  . prochlorperazine (COMPAZINE) 10 MG tablet Take 1 tablet (10 mg total) by mouth every 6 (six) hours as needed (Nausea or vomiting). [place]  tablet 1   No current facility-administered medications for this visit.     PHYSICAL EXAMINATION:  ECOG PERFORMANCE STATUS: 0 - Asymptomatic  Vitals:   05/05/20 0929  BP: 105/69  Pulse: 94  Resp: 16  Temp: 98 F (36.7 C)  SpO2: 98%   Filed Weights   05/05/20 0929  Weight: 115 lb 14.4 oz (52.6 kg)    GENERAL:alert, no distress and comfortable NECK:  Tracheostomy in place,   trach site without any overt signs of infection. LYMPH: No obvious palpable lymphadenopathy LUNGS: CTA  HEART: regular rate & rhythm and no murmurs and no lower extremity edema ABDOMEN:abdomen soft, non-tender and normal bowel sounds, G tube site looks better. Musculoskeletal:no cyanosis of digits and  no clubbing  PSYCH: alert & oriented x 3 with fluent speech NEURO: no focal motor/sensory deficits  LABORATORY DATA:  I have reviewed the data as listed Lab Results  Component Value Date   WBC 6.6 05/05/2020   HGB 12.0 (L) 05/05/2020   HCT 36.4 (L) 05/05/2020   MCV 93.3 05/05/2020   PLT 257 05/05/2020     Chemistry      Component Value Date/Time   NA 137 05/05/2020 0902   K 4.6 05/05/2020 0902   CL 96 (L) 05/05/2020 0902   CO2 28 05/05/2020 0902   BUN 23 (H) 05/05/2020 0902   CREATININE 0.93 05/05/2020 0902   CREATININE 0.79 03/23/2020 1013      Component Value Date/Time   CALCIUM 9.3 05/05/2020 0902   ALKPHOS 88 03/23/2020 1013   AST 24 03/23/2020 1013   ALT 41 03/23/2020 1013   BILITOT 0.3 03/23/2020 1013     CBC reviewed, mild leukocytosis. BMP reviewed, no evidence of creatinine impairment Magnesium of 1.7.  RADIOGRAPHIC STUDIES: I have personally reviewed the radiological images as listed and agreed with the findings in the report. IR Gastrostomy Tube  Result Date: 04/20/2020 INDICATION: Laryngeal malignancy EXAM: Percutaneous gastrostomy tube placement MEDICATIONS: Ancef 2 g IV; Antibiotics were administered within 1 hour of the procedure. Glucagon 1 mg IV ANESTHESIA/SEDATION: Versed 3 mg IV; Fentanyl 100 mcg IV Moderate Sedation Time:  20 The patient was continuously monitored during the procedure by the interventional radiology nurse under my direct supervision. CONTRAST:  10 mL of Omnipaque-administered into the gastric lumen. FLUOROSCOPY TIME:  Fluoroscopy Time: 3 minutes 30 seconds (28 mGy). COMPLICATIONS: None immediate. PROCEDURE: Informed written consent was obtained from the patient after a thorough discussion of the procedural risks, benefits and alternatives. All questions were addressed. Maximal Sterile Barrier Technique was utilized including caps, mask, sterile gowns, sterile gloves, sterile drape, hand hygiene and skin antiseptic. A timeout was performed  prior to the initiation of the procedure. The left hepatic lobe margin was marked utilizing ultrasound guidance. The epigastric region was prepped and draped in the usual sterile fashion. The stomach was insufflated utilizing the NG tube. Following local lidocaine administration, three gastropexies were placed to secure the anterior wall of the stomach to the anterior abdominal wall. Percutaneous access obtained into the gastric antrum at the center of the gastropexies with an 18 gauge needle. Guide wire advanced into the gastric lumen. Gastrostomy tube placed on the guidewire. 10 mm balloon loaded on the guidewire and advanced through the gastrostomy tube. 10 mm balloon insufflated in the anterior abdominal/gastric wall and both balloon and 16 French gastrostomy tube inserted into the gastric lumen. The 10 mm balloon was removed. The G tube retention balloon was inflated with 7 mL of dilute contrast  and retracted to the anterior gastric wall. Contrast administrated through the gastrostomy tube opacified the gastric lumen. The insertion site was covered with sterile dressing. IMPRESSION: Fluoroscopy guided percutaneous gastrostomy tube placement as above. Electronically Signed   By: Miachel Roux M.D.   On: 04/20/2020 15:55   IR IMAGING GUIDED PORT INSERTION  Result Date: 04/20/2020 INDICATION: Nasopharyngeal carcinoma EXAM: IMPLANTED PORT A CATH PLACEMENT WITH ULTRASOUND AND FLUOROSCOPIC GUIDANCE MEDICATIONS: None ANESTHESIA/SEDATION: Moderate (conscious) sedation was employed during this procedure. A total of Versed 3 mg and Fentanyl 100 mcg was administered intravenously. Moderate Sedation Time: 21 minutes. The patient's level of consciousness and vital signs were monitored continuously by radiology nursing throughout the procedure under my direct supervision. FLUOROSCOPY TIME:  0 minutes, 45 seconds (3 mGy) COMPLICATIONS: None immediate. PROCEDURE: The procedure, risks, benefits, and alternatives were  explained to the patient. Questions regarding the procedure were encouraged and answered. The patient understands and consents to the procedure. A timeout was performed prior to the initiation of the procedure. Patient positioned supine on the angiography table. Right neck and anterior upper chest prepped and draped in the usual sterile fashion. All elements of maximal sterile barrier were utilized including, cap, mask, sterile gown, sterile gloves, large sterile drape, hand scrubbing and 2% Chlorhexidine for skin cleaning. The right internal jugular vein was evaluated with ultrasound and shown to be patent. A permanent ultrasound image was obtained and placed in the patient's medical record. Local anesthesia was provided with 1% lidocaine with epinephrine. Using sterile gel and a sterile probe cover, the right internal jugular vein was entered with a 21 ga needle during real time ultrasound guidance. 0.018 inch guidewire placed and 21 ga needle exchanged for transitional dilator set. Utilizing fluoroscopy, 0.035 inch guidewire advanced through the needle without difficulty. Attention then turned to the right anterior upper chest. Following local lidocaine administration, a port pocket was created. The catheter was connected to the port and brought from the pocket to the venotomy site through a subcutaneous tunnel. The catheter was cut to size and inserted through the peel-away sheath. The catheter tip was positioned at the cavoatrial junction using fluoroscopic guidance. The port aspirated and flushed well. The port pocket was closed with deep and superficial absorbable suture. The port pocket incision and venotomy sites were also sealed with Dermabond. IMPRESSION: Successful placement of a right internal jugular approach power injectable Port-A-Cath. The catheter is ready for immediate use. Electronically Signed   By: Miachel Roux M.D.   On: 04/20/2020 15:50    All questions were answered. The patient knows to  call the clinic with any problems, questions or concerns. I spent 30 minutes in the care of this patient including H and P, review of records, counseling and coordination of care.     Benay Pike, MD 05/06/2020 11:03 AM

## 2020-05-06 ENCOUNTER — Ambulatory Visit
Admission: RE | Admit: 2020-05-06 | Discharge: 2020-05-06 | Disposition: A | Discharge status: Home / Self Care | Payer: 59 | Source: Ambulatory Visit | Attending: Radiation Oncology | Admitting: Radiation Oncology

## 2020-05-06 ENCOUNTER — Ambulatory Visit: Payer: 59 | Attending: Radiation Oncology | Admitting: Physical Therapy

## 2020-05-06 ENCOUNTER — Ambulatory Visit: Payer: 59

## 2020-05-06 ENCOUNTER — Encounter: Payer: Self-pay | Admitting: General Practice

## 2020-05-06 ENCOUNTER — Encounter: Payer: Self-pay | Admitting: Physical Therapy

## 2020-05-06 ENCOUNTER — Encounter: Payer: Self-pay | Admitting: Hematology and Oncology

## 2020-05-06 DIAGNOSIS — R491 Aphonia: Secondary | ICD-10-CM | POA: Insufficient documentation

## 2020-05-06 DIAGNOSIS — C329 Malignant neoplasm of larynx, unspecified: Secondary | ICD-10-CM | POA: Diagnosis present

## 2020-05-06 DIAGNOSIS — R1310 Dysphagia, unspecified: Secondary | ICD-10-CM

## 2020-05-06 DIAGNOSIS — K1231 Oral mucositis (ulcerative) due to antineoplastic therapy: Secondary | ICD-10-CM | POA: Insufficient documentation

## 2020-05-06 DIAGNOSIS — R293 Abnormal posture: Secondary | ICD-10-CM | POA: Diagnosis not present

## 2020-05-06 NOTE — Therapy (Signed)
[place], [place], [address] Phone: [phone]   Fax:  [phone]  Physical Therapy Evaluation  Patient Details  Name: Erik [name] MRN: [id number] Date of Birth: January 19, 1966 Referring Provider (PT): Reita May Date: 05/06/2020   PT End of Session - 05/06/20 0919    Visit Number 1    Number of Visits 2    Date for PT Re-Evaluation 07/01/20    PT Start Time 0850    PT Stop Time 0914    PT Time Calculation (min) 24 min    Activity Tolerance Patient tolerated treatment well    Behavior During Therapy [facility] for tasks assessed/performed           Past Medical History:  Diagnosis Date  . Coronary artery disease   . Dyspnea   . GERD (gastroesophageal reflux disease)    Phreesia 02/21/2020  . Ruptured lumbar intervertebral disc     Past Surgical History:  Procedure Laterality Date  . CARDIAC CATHETERIZATION     with stent placement  . IR GASTROSTOMY TUBE MOD SED  04/20/2020  . IR IMAGING GUIDED PORT INSERTION  04/20/2020  . TONSILLECTOMY    . TRACHEOSTOMY TUBE PLACEMENT N/A 03/05/2020   Procedure: AWAKE TRACHEOSTOMY, DIRECT LARYNGOSCOPY WITH BIOPSY;  Surgeon: Melida Quitter, MD;  Location: [facility];  Service: ENT;  Laterality: N/A;    There were no vitals filed for this visit.    Subjective Assessment - 05/06/20 0850    Subjective I am feeling pretty decent overall. I have pain in the throat of course. I am nauseated sometimes.    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 P16 +, 03/04/20 CT scan revealed mucosal irregularity and mucosal/submucosal edema of the supraglottic and glottic larynx. Primary differential considerations included infectious/inflammatory laryngeal edema versus laryngeal malignancy. There was also noted to be associated mild-to-moderate narrowing, and mild rightward  deviation, of the airway at that level. There was additional edema within the cutaneous/subcutaneous ventral neck. Additionally, there was a mildly enlarged left level 2/3 lymph node, which may have reactive or could have reflected metastatic nodal disease. Finally, there was a non-specific 1.4 cm part cystic/part solid cutaneous/subcutaneous lesion within the midline upper neck, 03/05/20 Dr Redmond Baseman then performed an awake tracheostomy and direct laryngoscopy with biopsy. 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 p16(+), will receive 35 fractions to his supraglottis and bilateral neck and weekly cisplatin ([place]) . He started on 04/14/20 and will complete on 06/01/20, 04/20/20 PEG/PAC placed    Patient Stated Goals to gain info from provider    Currently in Pain? Yes    Pain Score 7     Pain Location Throat    Pain Orientation Medial    Pain Descriptors / Indicators Burning;Sore    Pain Type Acute pain    Pain Onset 1 to 4 weeks ago    Pain Frequency Constant    Aggravating Factors  swallowing    Pain Relieving Factors pain medicine    Effect of Pain on Daily Activities hard to swallow              [facility] PT Assessment - 05/06/20 0001      Assessment   Medical Diagnosis 6    Referring Provider (PT) Squire    Onset Date/Surgical Date 03/05/20  Hand Dominance Right    Prior Therapy none      Precautions   Precautions Other (comment)    Precaution Comments active cancer      Restrictions   Weight Bearing Restrictions No      Balance Screen   Has the patient fallen in the past 6 months No    Has the patient had a decrease in activity level because of a fear of falling?  No    Is the patient reluctant to leave their home because of a fear of falling?  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    Leisure pt does not exercise      Cognition   Overall Cognitive Status Within Functional Limits for tasks assessed      Observation/Other Assessments   Observations pt has trach in place      Functional Tests   Functional tests Sit to Stand      Sit to Stand   Comments 30 sec sit to stand - 16 reps - 17 is average for his age      Posture/Postural Control   Posture/Postural Control Postural limitations    Postural Limitations Rounded Shoulders;Forward head      ROM / Strength   AROM / PROM / Strength AROM      AROM   Cervical Flexion WFL   slightly limited due to trach   Cervical Extension WFL    Cervical - Right Side Bend WFL    Cervical - Left Side Bend WFL    Cervical - Right Rotation WFL    Cervical - Left Rotation [facility]      Ambulation/Gait   Ambulation/Gait Yes    Ambulation/Gait Assistance 7: Independent    Ambulation Distance (Feet) 10 Feet    Gait Pattern Within Functional Limits             LYMPHEDEMA/ONCOLOGY QUESTIONNAIRE - 05/06/20 0001      Lymphedema Assessments   Lymphedema Assessments Head and Neck      Head and Neck   Other not measured due to trach                   Objective measurements completed on examination: See above findings.               PT Education - 05/06/20 0917    Education Details Neck ROM, importance of posture when sitting, standing and lying down, deep breathing, walking program and importance of staying active throughout treatment, CURE article on staying active, "Why exercise?" flyer, lymphedema and PT info    Person(s) Educated Patient;Spouse    Methods Explanation;Handout    Comprehension Verbalized understanding               PT Long Term Goals - 05/06/20 5409      PT LONG  TERM GOAL #1   Title Pt will return to baseline cervical ROM and not demonstrate any signs or symptoms of lymphedema.    Time 8    Period Weeks    Status New    Target Date 07/01/20  Head and Neck Clinic Goals - 05/06/20 0938      Patient will be able to verbalize understanding of a home exercise program for cervical range of motion, posture, and walking.    Time 1    Period Days    Status Achieved      Patient will be able to verbalize understanding of proper sitting and standing posture.    Time 1    Period Days    Status Achieved      Patient will be able to verbalize understanding of lymphedema risk and availability of treatment for this condition.    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 Pt is currently undergoing radiation and will complete on 06/01/20. He is also doing chemo therapy. Pt has PEG placed and has been using it as well as a trach. Pt's cervical ROM is currently [facility]. His gait is [facility] and he is able to complete 16 sit to stands in 30 seconds which is average for his age.Educated pt about signs and symptoms of lymphedema as well as anatomy and physiology of lymphatic system. Educated pt in importance of staying as active as possible throughout treatment to decrease fatigue as well as head and neck ROM exercises to decrease loss of ROM. Will see pt after completion of radiation to reassess ROM and assess for lymphedema to determine therapy needs at that time.    Stability/Clinical Decision Making Stable/Uncomplicated    Clinical Decision Making Low    Rehab Potential Good    PT Frequency --   eval and 1 f/u visit   PT Duration 8 weeks    PT Treatment/Interventions ADLs/Self Care Home Management;Patient/family education;Therapeutic  exercise    PT Next Visit Plan reassess baselines    PT Home Exercise Plan head and neck ROM exercises    Consulted and Agree with Plan of Care Patient           Patient will benefit from skilled therapeutic intervention in order to improve the following deficits and impairments:  Pain,Postural dysfunction  Visit Diagnosis: Abnormal posture  Carcinoma larynx ([place])     Problem List Patient Active Problem List   Diagnosis Date Noted  . Chemotherapy-induced nausea 04/28/2020  . Weight loss, unintentional 04/28/2020  . Dysgeusia 04/28/2020  . G tube feedings ([place]) 04/28/2020  . Nicotine abuse 04/21/2020  . Head and neck cancer ([place])   . Tracheostomy status ([place])   . Malnutrition of moderate degree 03/05/2020  . Malignant neoplasm of supraglottis [facility]) 03/04/2020    Allyson Sabal [facility] 05/06/2020, 9:29 AM  [place], [place], [address] Phone: [phone]   Fax:  [phone]  Name: Erik [name] MRN: [id number] Date of Birth: 1965/05/17  Manus Gunning, PT 05/06/20 9:29 AM

## 2020-05-06 NOTE — Progress Notes (Signed)
[place] CSW Progress Notes  Met w patient and wife in exam room in [facility]. Provided information on Support Center resources.  He has been out of work since January, wife on disability.  Finances are strained as result of lack of income.  Referred to [employer], enrolled in [place] and provided first disbursement.  Emailed link to apply online for [employer].  Will see him in two weeks.  Edwyna Shell, LCSW Clinical Social Worker Phone:  [phone]

## 2020-05-06 NOTE — Patient Instructions (Signed)
SWALLOWING EXERCISES Do these until 6 months after your last day of radiation, then 2 times per week afterwards  1. Effortful Swallows - Press your tongue against the roof of your mouth for 3 seconds, then squeeze the muscles in your neck while you swallow your saliva or a sip of water - Repeat 10-15 times, 2-3 times a day, and use whenever you eat or drink  2. [place] Swallow - swallow with your tongue sticking out - Stick tongue out past your lips and gently bite tongue with your teeth - Swallow, while holding your tongue with your teeth - Repeat 10-15 times, 2-3 times a day *use a wet spoon if your mouth gets dry* 3. Mendelsohn Maneuver - "half swallow" exercise - Start to swallow, and keep your Adam's apple up by squeezing hard with the muscles of the throat - Hold the squeeze for 5-7 seconds and then relax - Repeat 10-15 times, 2-3 times a day *use a wet spoon if your mouth gets dry*       4.  "Super Swallow"  - Take a breath and hold it  - Bear down (like pushing your bowels)  - Swallow then IMMEDIATELY cough  - Repeat 10 times, 2-3 times a day

## 2020-05-06 NOTE — Assessment & Plan Note (Signed)
Lost 3 lbs since last visit Encouraged him to use G tube for feeding Continue FU with nutrition

## 2020-05-06 NOTE — Assessment & Plan Note (Signed)
Moderate to severe causing significant discomfort. Will recommend liquid hydrocodone for pain management Refill sent to [facility]

## 2020-05-06 NOTE — Therapy (Signed)
[facility] [address] [place], [place], [address] Phone: [phone]   Fax:  [phone]  Speech Language Pathology Evaluation  Patient Details  Name: Erik [name] MRN: [id number] Date of Birth: 1965-08-29 Referring Provider (SLP): Eppie Gibson, MD   Encounter Date: 05/06/2020   End of Session - 05/06/20 1116    Visit Number 1    Number of Visits 7    Date for SLP Re-Evaluation 08/04/20    SLP Start Time 0934    SLP Stop Time  56    SLP Time Calculation (min) 32 min    Activity Tolerance Patient tolerated treatment well           Past Medical History:  Diagnosis Date  . Coronary artery disease   . Dyspnea   . GERD (gastroesophageal reflux disease)    [employer] 02/21/2020  . Ruptured lumbar intervertebral disc     Past Surgical History:  Procedure Laterality Date  . CARDIAC CATHETERIZATION     with stent placement  . IR GASTROSTOMY TUBE MOD SED  04/20/2020  . IR IMAGING GUIDED PORT INSERTION  04/20/2020  . TONSILLECTOMY    . TRACHEOSTOMY TUBE PLACEMENT N/A 03/05/2020   Procedure: AWAKE TRACHEOSTOMY, DIRECT LARYNGOSCOPY WITH BIOPSY;  Surgeon: Melida Quitter, MD;  Location: [facility];  Service: ENT;  Laterality: N/A;    There were no vitals filed for this visit.       SLP Evaluation [facility] - 05/06/20 9323      SLP Visit Information   SLP Received On 05/06/20    Referring Provider (SLP) Eppie Gibson, MD    Onset Date fall 2021    Medical Diagnosis SCCA of lt vocal fold, rt vocal fold, and lt false vocal fold      Subjective   Subjective Pt aphonic. Is using PEG instead of POs due to taste.    Patient/Family Stated Goal Maintain normal swallow      Pain Assessment   Currently in Pain? Yes    Pain Score 7     Pain Location Throat    Pain Orientation Medial    Pain Type Acute pain    Pain Onset 1 to 4 weeks ago    Pain Frequency Constant    Pain Relieving Factors meds    Effect of Pain on Daily  Activities more painful to swallow      General Information   HPI On 03-04-20, CT revealed mucosal irregulariaty and mucosal/submucosual edema. Deviation of airway at that level. Additional edema seen within cutaneous/subcutanous ventral neck.Mild enlarged level 2/3 left node. 35 fractions began 04-14-20, and chemo weekly. 04/20/20 PEG placed, and a MBSS ordered but pt feel overwhelmed and so has not scheduled this yet.      Prior Functional Status   Cognitive/Linguistic Baseline Within functional limits      Cognition   Overall Cognitive Status Within Functional Limits for tasks assessed      Oral Motor/Sensory Function   Overall Oral Motor/Sensory Function Appears within functional limits for tasks assessed      Motor Speech   Overall Motor Speech Appears within functional limits for tasks assessed    Phonation Aphonic;Low vocal intensity   trach with PMSV in place   Intelligibility Intelligible           Pt currently tolerates regular diet/thin liquids, but is using the PEG due to dysgeusia and limited desire to eat via PO means.  POs: Pt swallowed bites of [place] sandwich and  drank multiple single sips water throughout eval without overt s/s aspiration. Thyroid elevation appeared limited due to trach, and swallows appeared timely. Pt's swallow deemed WFL at this time.   Because data states the risk for dysphagia during and after radiation treatment is high due to undergoing radiation tx, SLP taught pt about the possibility of reduced/limited ability for PO intake during rad tx. SLP encouraged pt to continue swallowing POs as far into rad tx as possible. Among other modifications for days when pt cannot functionally swallow, SLP talked about reducing the reps for each exercise, and then adding reps back in when it becomes possible to do so.  SLP educated pt re: changes to swallowing musculature after rad tx, and why adherence to dysphagia HEP provided today and PO consumption was necessary  to inhibit muscular disuse atrophy and to reduce muscle fibrosis following rad tx. Pt demonstrated understanding of these things to SLP.    SLP then developed a HEP for pt and pt was instructed how to perform exercises involving lingual, vocal, and pharyngeal strengthening. SLP performed each exercise and pt return demonstrated each exercise. SLP ensured pt performance was correct prior to moving on to next exercise. Pt was instructed to complete this program 2-3 times a day, 6-7 days/week until 6 months after his last rad tx, then x2 a week after that.                SLP Education - 05/06/20 1044    Education Details late effects of head/neck radiation on swallowing, HEP procedure    Person(s) Educated Patient;Spouse    Methods Explanation    Comprehension Verbalized understanding            SLP Short Term Goals - 05/06/20 1122      SLP SHORT TERM GOAL #1   Title pt will complete HEP with rare min A    Time 2    Period --   visits, for all STGs   Status New      SLP SHORT TERM GOAL #2   Title pt will tell SLP why pt is completing HEP with modified independence    Time 2    Status New      SLP SHORT TERM GOAL #3   Title pt will describe 3 overt s/s aspiration PNA with modified independence    Time 2    Status New      SLP SHORT TERM GOAL #4   Title pt will tell SLP how a food journal could hasten return to a more normalized diet    Time 2    Status New            SLP Long Term Goals - 05/06/20 1123      SLP LONG TERM GOAL #1   Title pt will complete HEP with modified independence over two visits    Time 3    Period --   visits, for all LTGs   Status New      SLP LONG TERM GOAL #2   Title pt will describe how to modify HEP over time, over two sessions    Time 4    Status New      SLP LONG TERM GOAL #3   Title pt will describe the timeline associated with reduction in HEP frequency with modified independence    Time 6    Status New             Plan - 05/06/20 1116  Clinical Impression Statement At this time pt swallowing is deemed WNL/WFL with [place] sandwich. SLP designed an individualized HEP for dysphagia and pt completed each exercise on their own with min cues. There are no overt s/s aspiration reported by pt at this time. Data indicate that pt's swallow ability will likely decrease over the course of chemoradiation therapy and could very well decline over time following conclusion of their chemoradiation therapy due to muscle disuse atrophy and/or muscle fibrosis. Pt will cont to need to be seen by SLP in order to assess safety of PO intake, assess the need for recommending any objective swallow assessment, and ensuring pt correctly completes the individualized HEP.    Speech Therapy Frequency --   once approx every 4 weeks   Duration --   7 total visits   Treatment/Interventions Aspiration precaution training;Pharyngeal strengthening exercises;Diet toleration management by SLP;Patient/family education;Trials of upgraded texture/liquids;SLP instruction and feedback;Compensatory strategies    Potential to Achieve Goals Good    SLP Home Exercise Plan provided    Consulted and Agree with Plan of Care Patient           Patient will benefit from skilled therapeutic intervention in order to improve the following deficits and impairments:   Dysphagia, unspecified type - Plan: SLP plan of care cert/re-cert  [place] - Plan: SLP plan of care cert/re-cert    Problem List Patient Active Problem List   Diagnosis Date Noted  . Mucositis due to antineoplastic therapy 05/06/2020  . Chemotherapy-induced nausea 04/28/2020  . Weight loss, unintentional 04/28/2020  . Dysgeusia 04/28/2020  . G tube feedings ([place]) 04/28/2020  . Nicotine abuse 04/21/2020  . Head and neck cancer ([place])   . Tracheostomy status ([place])   . Malnutrition of moderate degree 03/05/2020  . 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 [place], [place], [address] Phone: [phone]   Fax:  [phone]  Name: Erik [name] MRN: [id number] Date of Birth: December 12, 1965

## 2020-05-06 NOTE — Progress Notes (Signed)
Oncology Nurse Navigator Documentation  I met with Mr. Burridge and his wife [name] during head and neck MDC today. He is tolerating treatment well and denies any questions or concerns at this time. They know to call me if they have any further questions or concerns.   Harlow Asa RN, BSN, OCN Head & Neck Oncology Nurse [place] at [facility] Phone # [phone]  Fax # [phone]

## 2020-05-06 NOTE — Assessment & Plan Note (Signed)
[id number] Updated staging. He declined surgery. 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 If he has incomplete response with chemo radiation he will proceed with salvage surgery. He is now on week 5 of cisplatin ROS significant for severe pain in his mouth. PE no concerns Labs reviewed, ok to proceed with treatment.

## 2020-05-07 ENCOUNTER — Other Ambulatory Visit: Payer: Self-pay

## 2020-05-07 ENCOUNTER — Ambulatory Visit
Admission: RE | Admit: 2020-05-07 | Discharge: 2020-05-07 | Disposition: A | Discharge status: Home / Self Care | Payer: 59 | Source: Ambulatory Visit | Attending: Radiation Oncology | Admitting: Radiation Oncology

## 2020-05-10 ENCOUNTER — Other Ambulatory Visit: Payer: Self-pay | Admitting: Hematology and Oncology

## 2020-05-10 ENCOUNTER — Other Ambulatory Visit (HOSPITAL_COMMUNITY): Payer: Self-pay

## 2020-05-10 ENCOUNTER — Other Ambulatory Visit: Payer: Self-pay

## 2020-05-10 ENCOUNTER — Ambulatory Visit
Admission: RE | Admit: 2020-05-10 | Discharge: 2020-05-10 | Disposition: A | Discharge status: Home / Self Care | Payer: 59 | Source: Ambulatory Visit | Attending: Radiation Oncology | Admitting: Radiation Oncology

## 2020-05-10 MED ORDER — HYDROCODONE-ACETAMINOPHEN 7.5-325 MG/15ML PO SOLN
10.0000 mL | Freq: Four times a day (QID) | ORAL | 0 refills | Status: DC | PRN
  Filled 2020-05-10: qty 60, 2d supply, fill #0
  Filled 2020-05-10: qty 413, 10d supply, fill #0

## 2020-05-11 ENCOUNTER — Other Ambulatory Visit (HOSPITAL_COMMUNITY): Payer: Self-pay

## 2020-05-11 ENCOUNTER — Ambulatory Visit
Admission: RE | Admit: 2020-05-11 | Discharge: 2020-05-11 | Disposition: A | Discharge status: Home / Self Care | Payer: 59 | Source: Ambulatory Visit | Attending: Radiation Oncology | Admitting: Radiation Oncology

## 2020-05-12 ENCOUNTER — Encounter: Payer: Self-pay | Admitting: Hematology and Oncology

## 2020-05-12 ENCOUNTER — Inpatient Hospital Stay: Payer: 59 | Admitting: Nutrition

## 2020-05-12 ENCOUNTER — Ambulatory Visit
Admission: RE | Admit: 2020-05-12 | Discharge: 2020-05-12 | Disposition: A | Discharge status: Home / Self Care | Payer: 59 | Source: Ambulatory Visit | Attending: Radiation Oncology | Admitting: Radiation Oncology

## 2020-05-12 ENCOUNTER — Other Ambulatory Visit: Payer: Self-pay

## 2020-05-12 ENCOUNTER — Inpatient Hospital Stay: Payer: 59

## 2020-05-12 ENCOUNTER — Other Ambulatory Visit (HOSPITAL_COMMUNITY): Payer: Self-pay

## 2020-05-12 ENCOUNTER — Inpatient Hospital Stay (HOSPITAL_BASED_OUTPATIENT_CLINIC_OR_DEPARTMENT_OTHER): Payer: 59 | Admitting: Hematology and Oncology

## 2020-05-12 DIAGNOSIS — C321 Malignant neoplasm of supraglottis: Secondary | ICD-10-CM | POA: Diagnosis not present

## 2020-05-12 DIAGNOSIS — K1231 Oral mucositis (ulcerative) due to antineoplastic therapy: Secondary | ICD-10-CM | POA: Diagnosis not present

## 2020-05-12 DIAGNOSIS — R634 Abnormal weight loss: Secondary | ICD-10-CM

## 2020-05-12 LAB — CBC WITH DIFFERENTIAL/PLATELET
Abs Immature Granulocytes: 0.02 10*3/uL (ref 0.00–0.07)
Basophils Absolute: 0 10*3/uL (ref 0.0–0.1)
Basophils Relative: 0 %
Eosinophils Absolute: 0.1 10*3/uL (ref 0.0–0.5)
Eosinophils Relative: 2 %
HCT: 32.7 % — ABNORMAL LOW (ref 39.0–52.0)
Hemoglobin: 10.7 g/dL — ABNORMAL LOW (ref 13.0–17.0)
Immature Granulocytes: 0 %
Lymphocytes Relative: 5 %
Lymphs Abs: 0.2 10*3/uL — ABNORMAL LOW (ref 0.7–4.0)
MCH: 30.5 pg (ref 26.0–34.0)
MCHC: 32.7 g/dL (ref 30.0–36.0)
MCV: 93.2 fL (ref 80.0–100.0)
Monocytes Absolute: 0.5 10*3/uL (ref 0.1–1.0)
Monocytes Relative: 10 %
Neutro Abs: 3.9 10*3/uL (ref 1.7–7.7)
Neutrophils Relative %: 83 %
Platelets: 185 10*3/uL (ref 150–400)
RBC: 3.51 MIL/uL — ABNORMAL LOW (ref 4.22–5.81)
RDW: 13.7 % (ref 11.5–15.5)
WBC: 4.7 10*3/uL (ref 4.0–10.5)
nRBC: 0 % (ref 0.0–0.2)

## 2020-05-12 LAB — BASIC METABOLIC PANEL
Anion gap: 12 (ref 5–15)
BUN: 25 mg/dL — ABNORMAL HIGH (ref 6–20)
CO2: 29 mmol/L (ref 22–32)
Calcium: 9.3 mg/dL (ref 8.9–10.3)
Chloride: 96 mmol/L — ABNORMAL LOW (ref 98–111)
Creatinine, Ser: 0.77 mg/dL (ref 0.61–1.24)
GFR, Estimated: >60 mL/min (ref >60)
Glucose, Bld: 98 mg/dL (ref 70–99)
Potassium: 4.4 mmol/L (ref 3.5–5.1)
Sodium: 137 mmol/L (ref 135–145)

## 2020-05-12 LAB — MAGNESIUM: Magnesium: 1.7 mg/dL (ref 1.7–2.4)

## 2020-05-12 MED ORDER — MAGNESIUM SULFATE 2 GM/50ML IV SOLN
2.0000 g | Freq: Once | INTRAVENOUS | Status: AC
  Administered 2020-05-12: 2 g via INTRAVENOUS

## 2020-05-12 MED ORDER — PALONOSETRON HCL INJECTION 0.25 MG/5ML
0.2500 mg | Freq: Once | INTRAVENOUS | Status: AC
  Administered 2020-05-12: 0.25 mg via INTRAVENOUS

## 2020-05-12 MED ORDER — HEPARIN SOD (PORK) LOCK FLUSH 100 UNIT/ML IV SOLN
500.0000 [IU] | Freq: Once | INTRAVENOUS | Status: AC | PRN
  Administered 2020-05-12: 500 [IU]
  Filled 2020-05-12: qty 5

## 2020-05-12 MED ORDER — SODIUM CHLORIDE 0.9 % IV SOLN
150.0000 mg | Freq: Once | INTRAVENOUS | Status: AC
  Administered 2020-05-12: 150 mg via INTRAVENOUS
  Filled 2020-05-12: qty 150

## 2020-05-12 MED ORDER — SODIUM CHLORIDE 0.9% FLUSH
10.0000 mL | INTRAVENOUS | Status: DC | PRN
  Administered 2020-05-12: 10 mL
  Filled 2020-05-12: qty 10

## 2020-05-12 MED ORDER — PALONOSETRON HCL INJECTION 0.25 MG/5ML
INTRAVENOUS | Status: AC
  Filled 2020-05-12: qty 5

## 2020-05-12 MED ORDER — SODIUM CHLORIDE 0.9 % IV SOLN
40.0000 mg/m2 | Freq: Once | INTRAVENOUS | Status: AC
  Administered 2020-05-12: 66 mg via INTRAVENOUS
  Filled 2020-05-12: qty 66

## 2020-05-12 MED ORDER — POTASSIUM CHLORIDE IN NACL 20-0.9 MEQ/L-% IV SOLN
Freq: Once | INTRAVENOUS | Status: AC
Start: 2020-05-12 — End: 2020-05-12
  Filled 2020-05-12: qty 1000

## 2020-05-12 MED ORDER — MAGNESIUM SULFATE 2 GM/50ML IV SOLN
INTRAVENOUS | Status: AC
  Filled 2020-05-12: qty 50

## 2020-05-12 MED ORDER — SODIUM CHLORIDE 0.9 % IV SOLN
10.0000 mg | Freq: Once | INTRAVENOUS | Status: AC
  Administered 2020-05-12: 10 mg via INTRAVENOUS
  Filled 2020-05-12: qty 10

## 2020-05-12 MED ORDER — SODIUM CHLORIDE 0.9 % IV SOLN
Freq: Once | INTRAVENOUS | Status: AC
  Filled 2020-05-12: qty 250

## 2020-05-12 NOTE — Patient Instructions (Signed)
[facility] Discharge Instructions for Patients Receiving Chemotherapy  Today you received the following chemotherapy agents Cisplatin  To help prevent nausea and vomiting after your treatment, we encourage you to take your nausea medication as directed  If you develop nausea and vomiting that is not controlled by your nausea medication, call the clinic.   BELOW ARE SYMPTOMS THAT SHOULD BE REPORTED IMMEDIATELY:  *FEVER GREATER THAN 100.5 F  *CHILLS WITH OR WITHOUT FEVER  NAUSEA AND VOMITING THAT IS NOT CONTROLLED WITH YOUR NAUSEA MEDICATION  *UNUSUAL SHORTNESS OF BREATH  *UNUSUAL BRUISING OR BLEEDING  TENDERNESS IN MOUTH AND THROAT WITH OR WITHOUT PRESENCE OF ULCERS  *URINARY PROBLEMS  *BOWEL PROBLEMS  UNUSUAL RASH Items with * indicate a potential emergency and should be followed up as soon as possible.  Feel free to call the clinic should you have any questions or concerns. The clinic phone number is [phone].  Please show the CHEMO ALERT CARD at check-in to the Emergency Department and triage nurse.   

## 2020-05-12 NOTE — Assessment & Plan Note (Signed)
[id number] Updated staging. He declined surgery. 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 If he has incomplete response with chemo radiation he will proceed with salvage surgery. He is now on week 5 of cisplatin ROS fatigue and dysguesia, nausea well controlled, pain well controlled. PE no concerns, radiation changes neck, otherwise G tube site looks better. Labs reviewed, ok to proceed with treatment.

## 2020-05-12 NOTE — Progress Notes (Signed)
[place] NOTE  Patient Care Team: [name], NP as PCP - General (Nurse Practitioner) Melida Quitter, MD as Consulting Physician (Otolaryngology) Benay Pike, MD as Consulting Physician (Hematology and Oncology) Eppie Gibson, MD as Consulting Physician (Radiation Oncology) Malmfelt, Stephani Police, RN as Oncology Nurse Navigator  CHIEF COMPLAINTS/PURPOSE OF CONSULTATION:  Laryngeal cancer.  ASSESSMENT & PLAN:   Malignant neoplasm of supraglottis ([place]) [id number] Updated staging. He declined surgery. 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 If he has incomplete response with chemo radiation he will proceed with salvage surgery. He is now on week 5 of cisplatin ROS fatigue and dysguesia, nausea well controlled, pain well controlled. PE no concerns, radiation changes neck, otherwise G tube site looks better. Labs reviewed, ok to proceed with treatment.  Weight loss, unintentional Maintaining weight. Encouraged him to use G tube for feeding, using 6 cans a day Continue FU with nutrition  Mucositis due to antineoplastic therapy Pain well controlled on liquid hydrocontinue Continue Q 6 hrs PRN for pain control  No orders of the defined types were placed in this encounter.  HISTORY OF PRESENTING ILLNESS:   Erik [name] 55 y.o. male is here because of new diagnosis of laryngeal cancer  Chronology  Pt had noticed ongoing hoarseness for almost 6 months, and was seen by a doctor who suggested that it may be related to GERD He used medication for GERD but his hoarseness continued to worsen. He then started getting SOB and went to the ER. He was found to have airway obstruction and underwent elective tracheostomy and direct laryngoscopy with biopsy.  Pertinent imaging and pathology results so far.  03/04/2020  Mucosal irregularity and  mucosal/submucosal edema of the supraglottic and glottic larynx. Primary differential considerations include infectious/inflammatory laryngeal edema versus laryngeal malignancy. ENT consultation and direct visualization recommended. Associated mild-to-moderate narrowing, and mild rightward deviation, of the airway at this level.  Additional edema within the cutaneous/subcutaneous ventral neck.  Mildly enlarged left level 2/3 lymph node which may be reactive or could reflect metastatic nodal disease.  Nonspecific 1.4 cm part cystic/part solid cutaneous/subcutaneous lesion within the midline upper neck. Direct visualization recommended.  03/05/2020  FINAL MICROSCOPIC DIAGNOSIS:   A. LEFT VOCAL CORD, EXCISION:  - Invasive moderately differentiated squamous cell carcinoma, see  comment   B. RIGHT VOCAL CORD, EXCISION:  - Invasive moderately differentiated squamous cell carcinoma, see  comment   C. LEFT FALSE VOCAL CORD, EXCISION:  - Invasive moderately differentiated squamous cell carcinoma, see  comment   P 16 positive  INTERVAL HISTORY  He is now on weekly cisplatin. He feels so so today. Pain is well controlled with liquid hydrocodone, using it 4 times a day. Besides pain and fatigue, he has dysgeusia No change in breathing, bowel habits or urinary habits. No new neurological complaints. No fevers or chills, neuropathy, change in hearing. He is trying to push water mostly through G tube, also drinks some Weight has maintained. Rest of the pertinent 10 point ROS reviewed and negative.  MEDICAL HISTORY:  Past Medical History:  Diagnosis Date  . Coronary artery disease   . Dyspnea   . GERD (gastroesophageal reflux disease)    [employer] 02/21/2020  . Ruptured lumbar intervertebral disc     SURGICAL HISTORY: Past Surgical History:  Procedure Laterality Date  . CARDIAC CATHETERIZATION     with stent  placement  . IR GASTROSTOMY TUBE MOD SED  04/20/2020  . IR  IMAGING GUIDED PORT INSERTION  04/20/2020  . TONSILLECTOMY    . TRACHEOSTOMY TUBE PLACEMENT N/A 03/05/2020   Procedure: AWAKE TRACHEOSTOMY, DIRECT LARYNGOSCOPY WITH BIOPSY;  Surgeon: Melida Quitter, MD;  Location: [facility];  Service: ENT;  Laterality: N/A;    SOCIAL HISTORY: Social History   Socioeconomic History  . Marital status: Married    Spouse name: Not on file  . Number of children: Not on file  . Years of education: Not on file  . Highest education level: Not on file  Occupational History  . Not on file  Tobacco Use  . Smoking status: Current Every Day Smoker    Types: Cigarettes  . Smokeless tobacco: Former Systems developer    Types: Secondary school teacher  . Vaping Use: Former  Substance and Sexual Activity  . Alcohol use: No  . Drug use: No  . Sexual activity: Yes  Other Topics Concern  . Not on file  Social History Narrative  . Not on file   Social Determinants of Health   Financial Resource Strain: High Risk  . Difficulty of Paying Living Expenses: Hard  Food Insecurity: Food Insecurity Present  . Worried About Charity fundraiser in the Last Year: Sometimes true  . Ran Out of Food in the Last Year: Sometimes true  Transportation Needs: No Transportation Needs  . Lack of Transportation (Medical): No  . Lack of Transportation (Non-Medical): No  Physical Activity: Not on file  Stress: Not on file  Social Connections: Not on file  Intimate Partner Violence: Not on file    FAMILY HISTORY: No family history on file.  ALLERGIES:  has No Known Allergies.  MEDICATIONS:  Current Outpatient Medications  Medication Sig Dispense Refill  . dexamethasone (DECADRON) 4 MG tablet Take 2 tablets (8 mg total) by mouth daily. Take daily x 3 days starting the day after cisplatin chemotherapy. Take with food. 30 tablet 1  . HYDROcodone-acetaminophen (HYCET) 7.5-325 mg/15 ml solution Take 10 mLs by mouth every 6 (six) hours as needed for moderate pain or severe pain (on concurrent  chemoradiation). 473 mL 0  . lidocaine (XYLOCAINE) 2 % solution Patient: Mix 1part 2% viscous lidocaine, 1part H20. Swallow 31mL of diluted mixture, 28min before meals and at bedtime, up to QID 200 mL 4  . lidocaine-prilocaine (EMLA) cream Apply to affected area once 30 g 3  . Multiple Vitamin (MULTIVITAMIN WITH MINERALS) TABS tablet Take 1 tablet by mouth daily.    . Nutritional Supplements (FEEDING SUPPLEMENT, OSMOLITE 1.5 CAL,) LIQD Give 1 1/2 cartons of tube feeding at 8am, noon, 4pm and 8pm via tube.  Flush with 40ml of water before and after each feeding.  Must drink or give via tube 16oz bottle water for additional hydration. 1422 mL 0  . ondansetron (ZOFRAN) 8 MG tablet Take 1 tablet (8 mg total) by mouth 2 (two) times daily as needed. Start on the third day after cisplatin chemotherapy. 30 tablet 1  . prochlorperazine (COMPAZINE) 10 MG tablet Take 1 tablet (10 mg total) by mouth every 6 (six) hours as needed (Nausea or vomiting). 30 tablet 1  . ibuprofen (ADVIL,MOTRIN) 200 MG tablet Take 800 mg by mouth every 8 (eight) hours as needed for mild pain. For pain (Patient not taking: Reported on 05/12/2020)    . LORazepam (ATIVAN) 0.5 MG tablet Take 1 tablet (0.5 mg total) by mouth every 6 (six) hours as needed (  Nausea or vomiting). 30 tablet 0   No current facility-administered medications for this visit.   Facility-Administered Medications Ordered in Other Visits  Medication Dose Route Frequency Provider Last Rate Last Admin  . CISplatin (PLATINOL) 66 mg in sodium chloride 0.9 % 250 mL chemo infusion  40 mg/m2 (Treatment Plan Recorded) Intravenous Once Haliyah Fryman, Arletha Pili, MD 316 mL/hr at 05/12/20 1233 66 mg at 05/12/20 1233  . heparin lock flush 100 unit/mL  500 Units Intracatheter Once PRN Myrian Botello, MD      . sodium chloride flush (NS) 0.9 % injection 10 mL  10 mL Intracatheter PRN Oriel Ojo, MD         PHYSICAL EXAMINATION:  ECOG PERFORMANCE STATUS: 0 - Asymptomatic  Vitals:    05/12/20 0842  BP: 113/69  Pulse: 81  Resp: 17  Temp: 97.8 F (36.6 C)  SpO2: 99%   Filed Weights   05/12/20 0842  Weight: 114 lb 6.4 oz (51.9 kg)    GENERAL:alert, no distress and comfortable NECK:  Tracheostomy in place,   trach site without any overt signs of infection. LYMPH: No obvious palpable lymphadenopathy LUNGS: CTA  HEART: regular rate & rhythm and no murmurs and no lower extremity edema ABDOMEN:abdomen soft, non-tender and normal bowel sounds, G tube site looks better. Musculoskeletal:no cyanosis of digits and no clubbing  PSYCH: alert & oriented x 3 with fluent speech NEURO: no focal motor/sensory deficits  LABORATORY DATA:  I have reviewed the data as listed Lab Results  Component Value Date   WBC 4.7 05/12/2020   HGB 10.7 (L) 05/12/2020   HCT 32.7 (L) 05/12/2020   MCV 93.2 05/12/2020   PLT 185 05/12/2020     Chemistry      Component Value Date/Time   NA 137 05/12/2020 0822   K 4.4 05/12/2020 0822   CL 96 (L) 05/12/2020 0822   CO2 29 05/12/2020 0822   BUN 25 (H) 05/12/2020 0822   CREATININE 0.77 05/12/2020 0822   CREATININE 0.79 03/23/2020 1013      Component Value Date/Time   CALCIUM 9.3 05/12/2020 0822   ALKPHOS 88 03/23/2020 1013   AST 24 03/23/2020 1013   ALT 41 03/23/2020 1013   BILITOT 0.3 03/23/2020 1013     CBC reviewed, mild leukocytosis. BMP reviewed, no evidence of creatinine impairment Magnesium of 1.7.  RADIOGRAPHIC STUDIES: I have personally reviewed the radiological images as listed and agreed with the findings in the report. IR Gastrostomy Tube  Result Date: 04/20/2020 INDICATION: Laryngeal malignancy EXAM: Percutaneous gastrostomy tube placement MEDICATIONS: Ancef 2 g IV; Antibiotics were administered within 1 hour of the procedure. Glucagon 1 mg IV ANESTHESIA/SEDATION: Versed 3 mg IV; Fentanyl 100 mcg IV Moderate Sedation Time:  20 The patient was continuously monitored during the procedure by the interventional radiology  nurse under my direct supervision. CONTRAST:  10 mL of Omnipaque-administered into the gastric lumen. FLUOROSCOPY TIME:  Fluoroscopy Time: 3 minutes 30 seconds (28 mGy). COMPLICATIONS: None immediate. PROCEDURE: Informed written consent was obtained from the patient after a thorough discussion of the procedural risks, benefits and alternatives. All questions were addressed. Maximal Sterile Barrier Technique was utilized including caps, mask, sterile gowns, sterile gloves, sterile drape, hand hygiene and skin antiseptic. A timeout was performed prior to the initiation of the procedure. The left hepatic lobe margin was marked utilizing ultrasound guidance. The epigastric region was prepped and draped in the usual sterile fashion. The stomach was insufflated utilizing the NG tube. Following local lidocaine administration, three  gastropexies were placed to secure the anterior wall of the stomach to the anterior abdominal wall. Percutaneous access obtained into the gastric antrum at the center of the gastropexies with an 18 gauge needle. Guide wire advanced into the gastric lumen. Gastrostomy tube placed on the guidewire. 10 mm balloon loaded on the guidewire and advanced through the gastrostomy tube. 10 mm balloon insufflated in the anterior abdominal/gastric wall and both balloon and 16 French gastrostomy tube inserted into the gastric lumen. The 10 mm balloon was removed. The G tube retention balloon was inflated with 7 mL of dilute contrast and retracted to the anterior gastric wall. Contrast administrated through the gastrostomy tube opacified the gastric lumen. The insertion site was covered with sterile dressing. IMPRESSION: Fluoroscopy guided percutaneous gastrostomy tube placement as above. Electronically Signed   By: Miachel Roux M.D.   On: 04/20/2020 15:55   IR IMAGING GUIDED PORT INSERTION  Result Date: 04/20/2020 INDICATION: Nasopharyngeal carcinoma EXAM: IMPLANTED PORT A CATH PLACEMENT WITH ULTRASOUND  AND FLUOROSCOPIC GUIDANCE MEDICATIONS: None ANESTHESIA/SEDATION: Moderate (conscious) sedation was employed during this procedure. A total of Versed 3 mg and Fentanyl 100 mcg was administered intravenously. Moderate Sedation Time: 21 minutes. The patient's level of consciousness and vital signs were monitored continuously by radiology nursing throughout the procedure under my direct supervision. FLUOROSCOPY TIME:  0 minutes, 45 seconds (3 mGy) COMPLICATIONS: None immediate. PROCEDURE: The procedure, risks, benefits, and alternatives were explained to the patient. Questions regarding the procedure were encouraged and answered. The patient understands and consents to the procedure. A timeout was performed prior to the initiation of the procedure. Patient positioned supine on the angiography table. Right neck and anterior upper chest prepped and draped in the usual sterile fashion. All elements of maximal sterile barrier were utilized including, cap, mask, sterile gown, sterile gloves, large sterile drape, hand scrubbing and 2% Chlorhexidine for skin cleaning. The right internal jugular vein was evaluated with ultrasound and shown to be patent. A permanent ultrasound image was obtained and placed in the patient's medical record. Local anesthesia was provided with 1% lidocaine with epinephrine. Using sterile gel and a sterile probe cover, the right internal jugular vein was entered with a 21 ga needle during real time ultrasound guidance. 0.018 inch guidewire placed and 21 ga needle exchanged for transitional dilator set. Utilizing fluoroscopy, 0.035 inch guidewire advanced through the needle without difficulty. Attention then turned to the right anterior upper chest. Following local lidocaine administration, a port pocket was created. The catheter was connected to the port and brought from the pocket to the venotomy site through a subcutaneous tunnel. The catheter was cut to size and inserted through the peel-away  sheath. The catheter tip was positioned at the cavoatrial junction using fluoroscopic guidance. The port aspirated and flushed well. The port pocket was closed with deep and superficial absorbable suture. The port pocket incision and venotomy sites were also sealed with Dermabond. IMPRESSION: Successful placement of a right internal jugular approach power injectable Port-A-Cath. The catheter is ready for immediate use. Electronically Signed   By: Miachel Roux M.D.   On: 04/20/2020 15:50   Labs from today reviewed, ok to proceed with treatment.  All questions were answered. The patient knows to call the clinic with any problems, questions or concerns. I spent 30 minutes in the care of this patient including H and P, review of records, counseling and coordination of care.     Benay Pike, MD 05/12/2020 1:12 PM

## 2020-05-12 NOTE — Progress Notes (Signed)
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  Patient is receiving concurrent chemoradiation therapy. Weight decreased slightly to 114.4 pounds April 13 from 115.9 pounds April 6..  This is decreased from 118 pounds March 30. Noted labs: Magnesium 1.7 and BUN 25. Patient is tolerating 6 cartons Osmolite 1.5 via PEG in 4 feedings.  He flushes his feeding tube with 60 mL of free water before and after bolus feedings.  He gives an additional 16 ounces of water via tube. Patient is denying nausea, vomiting, constipation, and diarrhea. Reports no oral intake secondary to severe taste alterations but is sipping on water. Patient reports it is always been difficult for him to gain weight. No questions or concerns today.  6 cartons Osmolite 1.5+ free water provides 2130 cal, 89.4 g protein, 2060 mL free water.  Estimated nutrition needs: 1700-2000 cal, 85-100 g protein, 1.7 L fluid.  Nutrition diagnosis: Unintentional weight loss continues.  Intervention: Increase Osmolite 1.5-7 cartons daily via PEG with 60 mL free water before and after each bolus feeding.  Give 2 cartons twice daily and 1-1/2 cartons twice daily.  Continue an additional 16 ounces of water via PEG as well as fluids by mouth. Continue to encourage other liquids by mouth despite taste alterations. Will monitor tube feeding tolerance at new goal rate and adjust orders as needed after follow-up next week.  7 cartons of Osmolite 1.5 will provide 2485 cal 104.3 g protein.  Monitoring, evaluation, goals: Patient will tolerate increased tube feeding to minimize weight loss.  Next visit: Wednesday, April 20 during infusion.  **Disclaimer: This note was dictated with voice recognition software. Similar sounding words can inadvertently be transcribed and this note may contain transcription errors which may not have been corrected upon publication of note.**

## 2020-05-12 NOTE — Assessment & Plan Note (Signed)
Pain well controlled on liquid hydrocontinue Continue Q 6 hrs PRN for pain control

## 2020-05-12 NOTE — Assessment & Plan Note (Signed)
Maintaining weight. Encouraged him to use G tube for feeding, using 6 cans a day Continue FU with nutrition

## 2020-05-13 ENCOUNTER — Other Ambulatory Visit (HOSPITAL_COMMUNITY): Payer: Self-pay

## 2020-05-13 ENCOUNTER — Ambulatory Visit
Admission: RE | Admit: 2020-05-13 | Discharge: 2020-05-13 | Disposition: A | Discharge status: Home / Self Care | Payer: 59 | Source: Ambulatory Visit | Attending: Radiation Oncology | Admitting: Radiation Oncology

## 2020-05-13 DIAGNOSIS — C321 Malignant neoplasm of supraglottis: Secondary | ICD-10-CM

## 2020-05-13 MED ORDER — SONAFINE EX EMUL
1.0000 "application " | Freq: Two times a day (BID) | CUTANEOUS | Status: DC
  Administered 2020-05-13: 1 via TOPICAL

## 2020-05-14 ENCOUNTER — Ambulatory Visit
Admission: RE | Admit: 2020-05-14 | Discharge: 2020-05-14 | Disposition: A | Discharge status: Home / Self Care | Payer: 59 | Source: Ambulatory Visit | Attending: Radiation Oncology | Admitting: Radiation Oncology

## 2020-05-14 ENCOUNTER — Other Ambulatory Visit: Payer: Self-pay

## 2020-05-17 ENCOUNTER — Ambulatory Visit
Admission: RE | Admit: 2020-05-17 | Discharge: 2020-05-17 | Disposition: A | Discharge status: Home / Self Care | Payer: 59 | Source: Ambulatory Visit | Attending: Radiation Oncology | Admitting: Radiation Oncology

## 2020-05-17 ENCOUNTER — Other Ambulatory Visit: Payer: Self-pay

## 2020-05-18 ENCOUNTER — Ambulatory Visit
Admission: RE | Admit: 2020-05-18 | Discharge: 2020-05-18 | Disposition: A | Discharge status: Home / Self Care | Payer: 59 | Source: Ambulatory Visit | Attending: Radiation Oncology | Admitting: Radiation Oncology

## 2020-05-18 ENCOUNTER — Other Ambulatory Visit: Payer: Self-pay | Admitting: Radiation Oncology

## 2020-05-18 ENCOUNTER — Inpatient Hospital Stay: Payer: 59

## 2020-05-18 ENCOUNTER — Ambulatory Visit: Payer: 59 | Admitting: Hematology and Oncology

## 2020-05-18 ENCOUNTER — Other Ambulatory Visit: Payer: 59

## 2020-05-18 ENCOUNTER — Inpatient Hospital Stay (HOSPITAL_BASED_OUTPATIENT_CLINIC_OR_DEPARTMENT_OTHER): Payer: 59 | Admitting: Medical

## 2020-05-18 VITALS — BP 112/77 | HR 83 | Temp 98.6°F | Resp 16 | Ht 68.0 in | Wt 117.5 lb

## 2020-05-18 DIAGNOSIS — C321 Malignant neoplasm of supraglottis: Secondary | ICD-10-CM | POA: Diagnosis not present

## 2020-05-18 LAB — CBC WITH DIFFERENTIAL (CANCER CENTER ONLY)
Abs Immature Granulocytes: 0 10*3/uL (ref 0.00–0.07)
Basophils Absolute: 0 10*3/uL (ref 0.0–0.1)
Basophils Relative: 1 %
Eosinophils Absolute: 0 10*3/uL (ref 0.0–0.5)
Eosinophils Relative: 0 %
HCT: 30.7 % — ABNORMAL LOW (ref 39.0–52.0)
Hemoglobin: 10.2 g/dL — ABNORMAL LOW (ref 13.0–17.0)
Immature Granulocytes: 0 %
Lymphocytes Relative: 5 %
Lymphs Abs: 0.2 10*3/uL — ABNORMAL LOW (ref 0.7–4.0)
MCH: 31 pg (ref 26.0–34.0)
MCHC: 33.2 g/dL (ref 30.0–36.0)
MCV: 93.3 fL (ref 80.0–100.0)
Monocytes Absolute: 0.3 10*3/uL (ref 0.1–1.0)
Monocytes Relative: 8 %
Neutro Abs: 2.7 10*3/uL (ref 1.7–7.7)
Neutrophils Relative %: 86 %
Platelet Count: 117 10*3/uL — ABNORMAL LOW (ref 150–400)
RBC: 3.29 MIL/uL — ABNORMAL LOW (ref 4.22–5.81)
RDW: 14 % (ref 11.5–15.5)
WBC Count: 3.1 10*3/uL — ABNORMAL LOW (ref 4.0–10.5)
nRBC: 0 % (ref 0.0–0.2)

## 2020-05-18 LAB — MAGNESIUM: Magnesium: 1.7 mg/dL (ref 1.7–2.4)

## 2020-05-18 LAB — BASIC METABOLIC PANEL - CANCER CENTER ONLY
Anion gap: 11 (ref 5–15)
BUN: 25 mg/dL — ABNORMAL HIGH (ref 6–20)
CO2: 29 mmol/L (ref 22–32)
Calcium: 9.2 mg/dL (ref 8.9–10.3)
Chloride: 95 mmol/L — ABNORMAL LOW (ref 98–111)
Creatinine: 0.7 mg/dL (ref 0.61–1.24)
GFR, Estimated: >60 mL/min (ref >60)
Glucose, Bld: 95 mg/dL (ref 70–99)
Potassium: 4.1 mmol/L (ref 3.5–5.1)
Sodium: 135 mmol/L (ref 135–145)

## 2020-05-18 MED ORDER — SODIUM CHLORIDE 0.9% FLUSH
10.0000 mL | INTRAVENOUS | Status: AC | PRN
Start: 2020-05-18 — End: 2020-05-18
  Administered 2020-05-18: 10 mL
  Filled 2020-05-18: qty 10

## 2020-05-18 MED ORDER — HEPARIN SOD (PORK) LOCK FLUSH 100 UNIT/ML IV SOLN
500.0000 [IU] | INTRAVENOUS | Status: AC | PRN
  Administered 2020-05-18: 500 [IU]
  Filled 2020-05-18: qty 5

## 2020-05-18 NOTE — Patient Instructions (Signed)
[employer], Adult A central line is a long, thin tube (catheter) that can be used to collect blood for testing or to give medicine through a vein. The tip of the central line ends in a large vein just above the heart (vena cava). A central line may be placed because:  You need to get medicines or fluids through an IV for a long period of time.  You need nutrition but cannot eat or absorb nutrients.  The veins in your hands or arms are difficult to use for IV access.  You need a blood transfusion.  You need chemotherapy or dialysis. Types of central lines There are four main types of central lines:  Peripherally inserted central catheter (PICC) line. This type is used for access of one week or longer. It can be used to draw blood and give fluids or medicines. A PICC looks like an IV tube, but it goes up the arm to the heart. It is usually inserted in the upper arm and taped in place on the arm.  Tunneled central line. This type is used for long-term therapy and dialysis. It is placed in a large vein in the neck, chest, or groin. It is inserted through a small incision made over the vein, and then it is advanced to the heart. It is tunneled under the skin and brought out through a second incision.  Non-tunneled central line. This type is used for short-term access, usually for a maximum of 7 days. It is often used in the emergency department. It is inserted in the neck, chest, or groin.  Implanted port. This type is used for long-term therapy. It can stay in place longer than other types of central lines. It is normally inserted in the upper chest, but it can also be placed in the upper arm or the abdomen. It is inserted and removed with surgery, and it is accessed using a needle. The type of central line that you receive depends on how long you will need it, your medical condition, and the condition of your veins.   Tell a health care provider about:  Any allergies you have.  All  medicines you are taking, including vitamins, herbs, eye drops, creams, and over-the-counter medicines.  Any problems you or family members have had with anesthetic medicines.  Any blood disorders you have.  Any surgeries you have had.  Any medical conditions you have.  Whether you are pregnant or may be pregnant. What are the risks? Generally, placement and use of a central line is safe. However, problems may occur, including:  Infection.  A blood clot that blocks the central line or forms in the vein and travels to the heart.  Bleeding from the place where the central line was inserted.  Developing a hole or crack within the central line. If this happens, the central line will need to be replaced.  Central line failure.  The catheter moving or coming out of place. What happens before the procedure? Medicines Ask your health care provider about:  Changing or stopping your regular medicines. This is especially important if you are taking diabetes medicines or blood thinners.  Taking medicines such as aspirin and ibuprofen. These medicines can thin your blood. Do not take these medicines unless your health care provider tells you to take them.  Taking over-the-counter medicines, vitamins, herbs, and supplements. General instructions  Follow instructions from your health care provider about eating or drinking restrictions.  Ask your health care provider: ? How your  procedure site will be marked. ? What steps will be taken to help prevent infection. These steps may include:  Removing hair at the procedure site.  Washing skin with a germ-killing soap.  Plan to have a responsible adult take you home from the hospital or clinic.  If you will be going home right after the procedure, plan to have a responsible adult care for you for the time you are told. This is important. What happens during the procedure? The procedure will vary depending on the type of central line being  placed. In general:  An IV will be inserted into one of your veins.  You will be given one or more of the following: ? A medicine to help you relax (sedative). ? A medicine to numb the area (local anesthetic).  Your skin will be cleaned with a germ-killing (antiseptic) solution, and you may be covered with sterile drapes.  Your blood pressure, heart rate, breathing rate, and blood oxygen level will be monitored during the procedure.  The central line catheter will be inserted into the vein and advanced to the correct spot. The health care provider may use X-ray equipment to help guide the catheter to the right place.  A bandage (dressing) will be placed over the insertion area. The procedure may vary among health care providers and hospitals. What can I expect after the procedure?  Your blood pressure, heart rate, breathing rate, and blood oxygen level will be monitored until you leave the hospital or clinic.  Antiseptic caps may be placed on the ends of the central line tubing.  If you were given a sedative during the procedure, it can affect you for several hours. Do not drive or operate machinery until your health care provider says that it is safe. Follow these instructions at home: Flushing and cleaning the central line  Follow instructions from your health care provider about flushing and cleaning the central line and the area around it.  Only use sterile supplies to flush the central line. Use supplies from your health care provider, a pharmacy, or another source that is recommended by your health care provider.  Before you flush the central line or clean the central line or the area around it: ? Wash your hands with soap and water for at least 20 seconds. If soap and water are not available, use alcohol-based hand sanitizer. ? Clean the central line hub with rubbing alcohol. Unless directed otherwise by the manufacturer's instructions, scrub using a twisting motion and rub for  10 to 15 seconds or for 30 twists. Be sure you scrub the top of the hub, not just the sides. Never reuse alcohol pads. Let the hub dry before use. Prevent it from touching anything while drying.   Caring for the incision or central line site  Check your incision or central line site every day for signs of infection. Check for: ? Redness, swelling, or pain. ? Fluid or blood. ? Warmth. ? Pus or a bad smell.  Keep the insertion site of your central line clean and dry at all times.  Change your dressing only as told by your health care provider.  Keep your dressing dry. If it gets wet, have it changed as soon as possible. General instructions  Follow instructions from your health care provider for the type of device that you have.  Keep the tube clamped, unless it is being used.  If the central line accidentally gets pulled on, make sure: ? The dressing is okay. ?  There is no bleeding. ? The line has not been pulled out.  Do not use scissors or sharp objects near the tube.  Do not take baths, swim, or use a hot tub until your health care provider approves. Ask your health care provider if you may take showers. You may only be allowed to take sponge baths.  Ask your health care provider what activities are safe for you. You may be restricted from lifting or making repetitive arm movements on the side of your central line.  Take over-the-counter and prescription medicines only as told by your health care provider.  Keep all follow-up visits. This is important. Storage and disposal of supplies  Keep your supplies in a clean, dry location.  Throw away any used syringes in a disposal container that is meant for sharp items (sharps container). You can buy a sharps container from a pharmacy, or you can make one by using an empty hard plastic bottle with a cover.  Place any used dressings or infusion bags into a plastic bag. Throw that bag in the trash. Contact a health care provider  if:  You have redness, swelling, or pain around your insertion site.  You have fluid or blood coming from your insertion site.  Your insertion site feels warm to the touch.  You have pus or a bad smell coming from your insertion site. Get help right away if:  You have: ? A fever or chills. ? Shortness of breath. ? Chest pain or a racing heartbeat. ? Swelling in your neck, face, chest, or arm on the side of your central line.  You feel dizzy or you faint.  Your incision or central line site has red streaks spreading away from the area.  Your incision or central line site is bleeding and does not stop.  Your central line is difficult to flush or will not flush.  You do not get a blood return from the central line.  Your central line gets loose or damaged or comes out.  Your catheter leaks when flushed or when fluids are infused into it. Summary  A central line is a long, thin tube (catheter) that can be used to give medicine through a vein.  Follow specific instructions from your health care provider for the type of device that you have.  Keep the insertion site of your central line clean and dry at all times.  Keep the tube clamped, unless it is being used. This information is not intended to replace advice given to you by your health care provider. Make sure you discuss any questions you have with your health care provider. Document Revised: 09/18/2019 Document Reviewed: 09/18/2019 Elsevier Patient Education  2021 [employer].

## 2020-05-19 ENCOUNTER — Other Ambulatory Visit: Payer: Self-pay | Admitting: Oncology

## 2020-05-19 ENCOUNTER — Other Ambulatory Visit (HOSPITAL_COMMUNITY): Payer: Self-pay

## 2020-05-19 ENCOUNTER — Ambulatory Visit
Admission: RE | Admit: 2020-05-19 | Discharge: 2020-05-19 | Disposition: A | Discharge status: Home / Self Care | Payer: 59 | Source: Ambulatory Visit | Attending: Radiation Oncology | Admitting: Radiation Oncology

## 2020-05-19 ENCOUNTER — Inpatient Hospital Stay: Payer: 59 | Admitting: Nutrition

## 2020-05-19 ENCOUNTER — Other Ambulatory Visit: Payer: Self-pay

## 2020-05-19 ENCOUNTER — Inpatient Hospital Stay: Payer: 59

## 2020-05-19 ENCOUNTER — Other Ambulatory Visit: Payer: 59

## 2020-05-19 ENCOUNTER — Inpatient Hospital Stay: Payer: 59 | Admitting: General Practice

## 2020-05-19 VITALS — BP 134/71 | HR 84 | Temp 98.5°F | Resp 17

## 2020-05-19 DIAGNOSIS — C321 Malignant neoplasm of supraglottis: Secondary | ICD-10-CM | POA: Diagnosis not present

## 2020-05-19 MED ORDER — PALONOSETRON HCL INJECTION 0.25 MG/5ML
INTRAVENOUS | Status: AC
  Filled 2020-05-19: qty 5

## 2020-05-19 MED ORDER — SODIUM CHLORIDE 0.9 % IV SOLN
40.0000 mg/m2 | Freq: Once | INTRAVENOUS | Status: AC
  Administered 2020-05-19: 66 mg via INTRAVENOUS
  Filled 2020-05-19: qty 66

## 2020-05-19 MED ORDER — PALONOSETRON HCL INJECTION 0.25 MG/5ML
0.2500 mg | Freq: Once | INTRAVENOUS | Status: AC
Start: 2020-05-19 — End: 2020-05-19
  Administered 2020-05-19: 0.25 mg via INTRAVENOUS

## 2020-05-19 MED ORDER — MAGNESIUM SULFATE 2 GM/50ML IV SOLN
2.0000 g | Freq: Once | INTRAVENOUS | Status: AC
  Administered 2020-05-19: 2 g via INTRAVENOUS

## 2020-05-19 MED ORDER — SODIUM CHLORIDE 0.9 % IV SOLN
Freq: Once | INTRAVENOUS | Status: AC
  Filled 2020-05-19: qty 250

## 2020-05-19 MED ORDER — POTASSIUM CHLORIDE IN NACL 20-0.9 MEQ/L-% IV SOLN
Freq: Once | INTRAVENOUS | Status: AC
  Filled 2020-05-19: qty 1000

## 2020-05-19 MED ORDER — MAGNESIUM SULFATE 2 GM/50ML IV SOLN
INTRAVENOUS | Status: AC
  Filled 2020-05-19: qty 50

## 2020-05-19 MED ORDER — HYDROCODONE-ACETAMINOPHEN 7.5-325 MG/15ML PO SOLN: 10.0000 mL | Freq: Four times a day (QID) | ORAL | 0 refills | Status: DC | PRN

## 2020-05-19 MED ORDER — SODIUM CHLORIDE 0.9% FLUSH
10.0000 mL | INTRAVENOUS | Status: DC | PRN
  Administered 2020-05-19: 10 mL
  Filled 2020-05-19: qty 10

## 2020-05-19 MED ORDER — SODIUM CHLORIDE 0.9 % IV SOLN
10.0000 mg | Freq: Once | INTRAVENOUS | Status: AC
Start: 2020-05-19 — End: 2020-05-19
  Administered 2020-05-19: 10 mg via INTRAVENOUS
  Filled 2020-05-19: qty 10

## 2020-05-19 MED ORDER — HEPARIN SOD (PORK) LOCK FLUSH 100 UNIT/ML IV SOLN
500.0000 [IU] | Freq: Once | INTRAVENOUS | Status: AC | PRN
Start: 2020-05-19 — End: 2020-05-19
  Administered 2020-05-19: 500 [IU]
  Filled 2020-05-19: qty 5

## 2020-05-19 MED ORDER — SODIUM CHLORIDE 0.9 % IV SOLN
150.0000 mg | Freq: Once | INTRAVENOUS | Status: AC
  Administered 2020-05-19: 150 mg via INTRAVENOUS
  Filled 2020-05-19: qty 150

## 2020-05-19 NOTE — Progress Notes (Signed)
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  Patient continues concurrent chemoradiation therapy. Weight improved and documented as 117.8 pounds increased from 114.4 pounds April 13. Labs reviewed. Patient only tolerating water and medications by mouth. Reports he is tolerating 7 cartons of Osmolite 1.5 daily via PEG with 60 mL of free water before and after bolus feedings 4 times daily. Reports he is beginning to get constipation.  He is going to start taking a stool softener. He has no questions or concerns.  Revised estimated nutrition needs.  2200-2500 cal, 100-110 g protein, 2.5 L fluid.  Nutrition diagnosis: Unintentional weight loss improved.  Intervention: Continue Osmolite 1.5-7 cartons daily via PEG with 60 mL free water before and after bolus feedings 4 times a day.  Continue an additional 16 ounces of water by PEG and fluids by mouth.  7 cartons Osmolite 1.5 provides 2485 cal and 104.3 g protein.  Monitoring, evaluation, goals: Patient will continue to tolerate tube feeding and oral intake to promote weight stabilization/minimize weight loss.  Next visit: Wednesday, April 27 during infusion.  **Disclaimer: This note was dictated with voice recognition software. Similar sounding words can inadvertently be transcribed and this note may contain transcription errors which may not have been corrected upon publication of note.**

## 2020-05-19 NOTE — Patient Instructions (Signed)
[facility] Discharge Instructions for Patients Receiving Chemotherapy  Today you received the following chemotherapy agents Cisplatin  To help prevent nausea and vomiting after your treatment, we encourage you to take your nausea medication as directed  If you develop nausea and vomiting that is not controlled by your nausea medication, call the clinic.   BELOW ARE SYMPTOMS THAT SHOULD BE REPORTED IMMEDIATELY:  *FEVER GREATER THAN 100.5 F  *CHILLS WITH OR WITHOUT FEVER  NAUSEA AND VOMITING THAT IS NOT CONTROLLED WITH YOUR NAUSEA MEDICATION  *UNUSUAL SHORTNESS OF BREATH  *UNUSUAL BRUISING OR BLEEDING  TENDERNESS IN MOUTH AND THROAT WITH OR WITHOUT PRESENCE OF ULCERS  *URINARY PROBLEMS  *BOWEL PROBLEMS  UNUSUAL RASH Items with * indicate a potential emergency and should be followed up as soon as possible.  Feel free to call the clinic should you have any questions or concerns. The clinic phone number is [phone].  Please show the CHEMO ALERT CARD at check-in to the Emergency Department and triage nurse.   

## 2020-05-20 ENCOUNTER — Ambulatory Visit
Admission: RE | Admit: 2020-05-20 | Discharge: 2020-05-20 | Disposition: A | Discharge status: Home / Self Care | Payer: 59 | Source: Ambulatory Visit | Attending: Radiation Oncology | Admitting: Radiation Oncology

## 2020-05-20 NOTE — Progress Notes (Signed)
[facility] Progress Note   Erik [name] [id number] [birth year] 55 y.o.  [name] is managed by Dr. Benay Pike  Actively treated with chemotherapy/immunotherapy/hormonal therapy: yes  Current therapy: Concurrent chemoradiation with cisplatin  Last treated: 05/12/2020 (cycle 5, day 1)  Next scheduled appointment with provider: 05/26/2020  Assessment: Plan:    Malignant neoplasm of supraglottis ([place])   Malignant neoplasm of the glottis: [name] presents to the clinic today for consideration of cycle 6, day 1 of concurrent chemoradiation with the patient receiving cisplatin.  We will proceed with his treatment today and will have him return as scheduled on 05/26/2020.  A review of the patient's labs show that his platelet count has dropped from 185 at his last visit to 117 today.  No change will be made and his cisplatin dose.  I did inform him that he may need a change in his cisplatin dose or even a week off from treatment on his return.  Please see After Visit Summary for patient specific instructions.  Future Appointments  Date Time Provider [place]  05/21/2020  9:45 AM [facility] LINAC 3 [facility] None  05/24/2020  9:45 AM [facility] [id number] [facility] None  05/25/2020  9:45 AM [facility] [id number] [facility] None  05/26/2020  7:30 AM [facility] [id number] [facility] None  05/26/2020  8:15 AM [facility] INFUSION [facility] None  05/26/2020  8:40 AM Iruku, Praveena, MD [facility] None  05/26/2020  9:30 AM [facility] INFUSION [facility] None  05/26/2020  9:45 AM Karie Mainland, RD [facility] None  05/27/2020  9:45 AM [facility] [id number] [facility] None  05/28/2020  9:50 AM [facility] [id number] [facility] None  05/31/2020  9:45 AM [facility] [id number] [facility] None  06/01/2020  9:45 AM [facility] [id number] [facility] None  06/03/2020  2:00 PM Schinke, [place], CCC-SLP [facility]  06/22/2020 10:00 AM Breedlove Blue, Blaire L, PT  [facility] None    No orders of the defined types were placed in this encounter.      Subjective:   Patient ID:  Erik [name] is a 55 y.o. (DOB [birth year]) male.  Chief Complaint: No chief complaint on file.   HPI [name]  is a 55 y.o. male with a diagnosis of a T4 malignant neoplasm of the glottis.  Mr. Cornwall is managed by Dr.Iruku and presents to the clinic today for consideration of cycle 6, day 1 of concurrent chemoradiation tomorrow with the patient receiving cisplatin.  He is tolerating his treatment well without significant side effects.  He denies nausea, vomiting, constipation, diarrhea, fevers, chills, or sweats.  He continues to intake food via his PEG tube.  He is drinking some small sips of water without difficulty.  He has gained over 3 pounds since his last visit of 6 days ago.  Medications: I have reviewed the patient's current medications.  Allergies: No Known Allergies  Past Medical History:  Diagnosis Date  . Coronary artery disease   . Dyspnea   . GERD (gastroesophageal reflux disease)    Phreesia 02/21/2020  . Ruptured lumbar intervertebral disc     Past Surgical History:  Procedure Laterality Date  . CARDIAC CATHETERIZATION     with stent placement  . IR GASTROSTOMY TUBE MOD SED  04/20/2020  . IR IMAGING GUIDED PORT INSERTION  04/20/2020  . TONSILLECTOMY    . TRACHEOSTOMY TUBE PLACEMENT N/A 03/05/2020   Procedure: AWAKE TRACHEOSTOMY, DIRECT LARYNGOSCOPY WITH BIOPSY;  Surgeon: Melida Quitter, MD;  Location: [facility];  Service: ENT;  Laterality: N/A;    No  family history on file.  Social History   Socioeconomic History  . Marital status: Married    Spouse name: Not on file  . Number of children: Not on file  . Years of education: Not on file  . Highest education level: Not on file  Occupational History  . Not on file  Tobacco Use  . Smoking status: Current Every Day Smoker    Types: Cigarettes  . Smokeless tobacco: Former Systems developer    Types: Scientist, physiological  . Vaping Use: Former  Substance and Sexual Activity  . Alcohol use: No  . Drug use: No  . Sexual activity: Yes  Other Topics Concern  . Not on file  Social History Narrative  . Not on file   Social Determinants of Health   Financial Resource Strain: High Risk  . Difficulty of Paying Living Expenses: Hard  Food Insecurity: Food Insecurity Present  . Worried About Charity fundraiser in the Last Year: Sometimes true  . Ran Out of Food in the Last Year: Sometimes true  Transportation Needs: No Transportation Needs  . Lack of Transportation (Medical): No  . Lack of Transportation (Non-Medical): No  Physical Activity: Not on file  Stress: Not on file  Social Connections: Not on file  Intimate Partner Violence: Not on file    Past Medical History, Surgical history, Social history, and Family history were reviewed and updated as appropriate.   Please see review of systems for further details on the patient's review from today.   Review of Systems:  Review of Systems  Constitutional: Negative for chills, diaphoresis and fever.  HENT: Positive for trouble swallowing. Negative for voice change.   Respiratory: Negative for cough, chest tightness, shortness of breath and wheezing.   Cardiovascular: Negative for chest pain and palpitations.  Gastrointestinal: Negative for abdominal pain, constipation, diarrhea, nausea and vomiting.  Musculoskeletal: Negative for back pain and myalgias.  Neurological: Negative for dizziness, light-headedness and headaches.    Objective:   Physical Exam:  BP 112/77 (BP Location: Left Arm, Patient Position: Sitting)   Pulse 83   Temp 98.6 F (37 C) (Tympanic)   Resp 16   Ht 5\' 8"  (1.727 m)   Wt 117 lb 8 oz (53.3 kg)   SpO2 99%   BMI 17.87 kg/m  ECOG: 0  Physical Exam Constitutional:      General: He is not in acute distress.    Appearance: He is not diaphoretic.  HENT:     Head: Normocephalic and atraumatic.  Eyes:      General: No scleral icterus.       Right eye: No discharge.        Left eye: No discharge.     Conjunctiva/sclera: Conjunctivae normal.  Neck:   Cardiovascular:     Rate and Rhythm: Normal rate and regular rhythm.     Heart sounds: Normal heart sounds. No murmur heard. No friction rub. No gallop.   Pulmonary:     Effort: Pulmonary effort is normal. No respiratory distress.     Breath sounds: Normal breath sounds. No wheezing or rales.  Skin:    General: Skin is warm and dry.     Findings: No erythema or rash.  Neurological:     Mental Status: He is alert.     Lab Review:     Component Value Date/Time   NA 135 05/18/2020 0850   K 4.1 05/18/2020 0850   CL 95 (L) 05/18/2020 1610  CO2 29 05/18/2020 0850   GLUCOSE 95 05/18/2020 0850   BUN 25 (H) 05/18/2020 0850   CREATININE 0.70 05/18/2020 0850   CALCIUM 9.2 05/18/2020 0850   PROT 7.3 03/23/2020 1013   ALBUMIN 3.3 (L) 03/23/2020 1013   AST 24 03/23/2020 1013   ALT 41 03/23/2020 1013   ALKPHOS 88 03/23/2020 1013   BILITOT 0.3 03/23/2020 1013   GFRNONAA >60 05/18/2020 0850       Component Value Date/Time   WBC 3.1 (L) 05/18/2020 0850   WBC 4.7 05/12/2020 0822   RBC 3.29 (L) 05/18/2020 0850   HGB 10.2 (L) 05/18/2020 0850   HCT 30.7 (L) 05/18/2020 0850   PLT 117 (L) 05/18/2020 0850   MCV 93.3 05/18/2020 0850   MCH 31.0 05/18/2020 0850   MCHC 33.2 05/18/2020 0850   RDW 14.0 05/18/2020 0850   LYMPHSABS 0.2 (L) 05/18/2020 0850   MONOABS 0.3 05/18/2020 0850   EOSABS 0.0 05/18/2020 0850   BASOSABS 0.0 05/18/2020 0850   -------------------------------  Imaging from last 24 hours (if applicable):  Radiology interpretation: No results found.

## 2020-05-21 ENCOUNTER — Ambulatory Visit
Admission: RE | Admit: 2020-05-21 | Discharge: 2020-05-21 | Disposition: A | Discharge status: Home / Self Care | Payer: 59 | Source: Ambulatory Visit | Attending: Radiation Oncology | Admitting: Radiation Oncology

## 2020-05-24 ENCOUNTER — Ambulatory Visit
Admission: RE | Admit: 2020-05-24 | Discharge: 2020-05-24 | Disposition: A | Discharge status: Home / Self Care | Payer: 59 | Source: Ambulatory Visit | Attending: Radiation Oncology | Admitting: Radiation Oncology

## 2020-05-24 ENCOUNTER — Other Ambulatory Visit: Payer: Self-pay

## 2020-05-25 ENCOUNTER — Ambulatory Visit
Admission: RE | Admit: 2020-05-25 | Discharge: 2020-05-25 | Disposition: A | Discharge status: Home / Self Care | Payer: 59 | Source: Ambulatory Visit | Attending: Radiation Oncology | Admitting: Radiation Oncology

## 2020-05-26 ENCOUNTER — Inpatient Hospital Stay (HOSPITAL_BASED_OUTPATIENT_CLINIC_OR_DEPARTMENT_OTHER): Payer: 59 | Admitting: Hematology and Oncology

## 2020-05-26 ENCOUNTER — Ambulatory Visit
Admission: RE | Admit: 2020-05-26 | Discharge: 2020-05-26 | Disposition: A | Discharge status: Home / Self Care | Payer: 59 | Source: Ambulatory Visit | Attending: Radiation Oncology | Admitting: Radiation Oncology

## 2020-05-26 ENCOUNTER — Encounter: Payer: Self-pay | Admitting: Hematology and Oncology

## 2020-05-26 ENCOUNTER — Other Ambulatory Visit: Payer: Self-pay

## 2020-05-26 ENCOUNTER — Inpatient Hospital Stay: Payer: 59

## 2020-05-26 ENCOUNTER — Telehealth: Payer: Self-pay | Admitting: Nutrition

## 2020-05-26 ENCOUNTER — Inpatient Hospital Stay: Payer: 59 | Admitting: Nutrition

## 2020-05-26 DIAGNOSIS — C321 Malignant neoplasm of supraglottis: Secondary | ICD-10-CM

## 2020-05-26 DIAGNOSIS — R634 Abnormal weight loss: Secondary | ICD-10-CM

## 2020-05-26 DIAGNOSIS — K1231 Oral mucositis (ulcerative) due to antineoplastic therapy: Secondary | ICD-10-CM

## 2020-05-26 DIAGNOSIS — Z930 Tracheostomy status: Secondary | ICD-10-CM

## 2020-05-26 LAB — BASIC METABOLIC PANEL - CANCER CENTER ONLY
Anion gap: 12 (ref 5–15)
BUN: 35 mg/dL — ABNORMAL HIGH (ref 6–20)
CO2: 32 mmol/L (ref 22–32)
Calcium: 9.9 mg/dL (ref 8.9–10.3)
Chloride: 94 mmol/L — ABNORMAL LOW (ref 98–111)
Creatinine: 0.78 mg/dL (ref 0.61–1.24)
GFR, Estimated: >60 mL/min (ref >60)
Glucose, Bld: 108 mg/dL — ABNORMAL HIGH (ref 70–99)
Potassium: 4.7 mmol/L (ref 3.5–5.1)
Sodium: 138 mmol/L (ref 135–145)

## 2020-05-26 LAB — CBC WITH DIFFERENTIAL (CANCER CENTER ONLY)
Abs Immature Granulocytes: 0.01 10*3/uL (ref 0.00–0.07)
Basophils Absolute: 0 10*3/uL (ref 0.0–0.1)
Basophils Relative: 1 %
Eosinophils Absolute: 0 10*3/uL (ref 0.0–0.5)
Eosinophils Relative: 1 %
HCT: 31 % — ABNORMAL LOW (ref 39.0–52.0)
Hemoglobin: 10.3 g/dL — ABNORMAL LOW (ref 13.0–17.0)
Immature Granulocytes: 1 %
Lymphocytes Relative: 5 %
Lymphs Abs: 0.1 10*3/uL — ABNORMAL LOW (ref 0.7–4.0)
MCH: 31.5 pg (ref 26.0–34.0)
MCHC: 33.2 g/dL (ref 30.0–36.0)
MCV: 94.8 fL (ref 80.0–100.0)
Monocytes Absolute: 0.3 10*3/uL (ref 0.1–1.0)
Monocytes Relative: 15 %
Neutro Abs: 1.6 10*3/uL — ABNORMAL LOW (ref 1.7–7.7)
Neutrophils Relative %: 77 %
Platelet Count: 168 10*3/uL (ref 150–400)
RBC: 3.27 MIL/uL — ABNORMAL LOW (ref 4.22–5.81)
RDW: 14.9 % (ref 11.5–15.5)
WBC Count: 2 10*3/uL — ABNORMAL LOW (ref 4.0–10.5)
nRBC: 0 % (ref 0.0–0.2)

## 2020-05-26 LAB — MAGNESIUM: Magnesium: 2.2 mg/dL (ref 1.7–2.4)

## 2020-05-26 MED ORDER — DOXYCYCLINE HYCLATE 100 MG PO TABS: 100.0000 mg | ORAL_TABLET | Freq: Two times a day (BID) | ORAL | 0 refills | Status: DC

## 2020-05-26 MED ORDER — SODIUM CHLORIDE 0.9% FLUSH
10.0000 mL | Freq: Once | INTRAVENOUS | Status: AC
  Administered 2020-05-26: 10 mL via INTRAVENOUS
  Filled 2020-05-26: qty 10

## 2020-05-26 NOTE — Assessment & Plan Note (Signed)
Concern for infection vs necrotic debris. Will give a week of doxycycline. Will re check on Friday If he has fevers or chills, or worsening discharge, he was instructed to go to the hospital right away.

## 2020-05-26 NOTE — Assessment & Plan Note (Signed)
[id number] Updated staging. He declined surgery. 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 If he has incomplete response with chemo radiation he will proceed with salvage surgery. He completed 6 weekly cycles of cisplatin. No chemotherapy related adverse effects that he reports PE, foul smelling discharge from trach site.  Will prescribe doxycycline and follow up in 2 days Defer chemo today, leukopenia and concern for infection

## 2020-05-26 NOTE — Progress Notes (Signed)
[place] NOTE  Patient Care Team: [name], NP as PCP - General (Nurse Practitioner) Melida Quitter, MD as Consulting Physician (Otolaryngology) Benay Pike, MD as Consulting Physician (Hematology and Oncology) Eppie Gibson, MD as Consulting Physician (Radiation Oncology) Malmfelt, Stephani Police, RN as Oncology Nurse Navigator  CHIEF COMPLAINTS/PURPOSE OF CONSULTATION:  Follow up for chemoradiation.  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 He declined surgery. 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 If he has incomplete response with chemo radiation he will proceed with salvage surgery. He completed 6 weekly cycles of cisplatin. No chemotherapy related adverse effects that he reports PE, foul smelling discharge from trach site.  Will prescribe doxycycline and follow up in 2 days Defer chemo today, leukopenia and concern for infection  Weight loss, unintentional Lost 6 lbs compared to last visit. He is using G tube for feeding, using about 7 cans a day He will talk to our nutrition team today  Mucositis due to antineoplastic therapy Pain well controlled on liquid hydrocontinue Continue Q 6 hrs PRN for pain control Last refilled 05/19/2020 PE, no severe oral mucositis.  Tracheostomy status ([place]) Concern for infection vs necrotic debris. Will give a week of doxycycline. Will re check on Friday If he has fevers or chills, or worsening discharge, he was instructed to go to the hospital right away.   No orders of the defined types were placed in this encounter.  HISTORY OF PRESENTING ILLNESS:   Soren Lazarz 55 y.o. male is here because of new diagnosis of laryngeal cancer  Oncology History Overview Note  Chronology  Pt had noticed ongoing hoarseness for almost 6 months, and was  seen by a doctor who suggested that it may be related to GERD He used medication for GERD but his hoarseness continued to worsen. He then started getting SOB and went to the ER. He was found to have airway obstruction and underwent elective tracheostomy and direct laryngoscopy with biopsy.  Pertinent imaging and pathology results so far.  03/04/2020  Mucosal irregularity and mucosal/submucosal edema of the supraglottic and glottic larynx. Primary differential considerations include infectious/inflammatory laryngeal edema versus laryngeal malignancy. ENT consultation and direct visualization recommended. Associated mild-to-moderate narrowing, and mild rightward deviation, of the airway at this level.   Additional edema within the cutaneous/subcutaneous ventral neck.   Mildly enlarged left level 2/3 lymph node which may be reactive or could reflect metastatic nodal disease.   Nonspecific 1.4 cm part cystic/part solid cutaneous/subcutaneous lesion within the midline upper neck. Direct visualization recommended.   03/05/2020  FINAL MICROSCOPIC DIAGNOSIS:   A. LEFT VOCAL CORD, EXCISION:  - Invasive moderately differentiated squamous cell carcinoma, see  comment   B. RIGHT VOCAL CORD, EXCISION:  - Invasive moderately differentiated squamous cell carcinoma, see  comment   C. LEFT FALSE VOCAL CORD, EXCISION:  - Invasive moderately differentiated squamous cell carcinoma, see  comment   P 16 positive He is now on weekly cisplatin. He completed 6 weekly cycles of cisplatin   Malignant neoplasm of supraglottis ([place])  03/04/2020 Initial Diagnosis   Malignant neoplasm of supraglottis ([place])   03/23/2020 Cancer Staging   Staging form: Larynx - Supraglottis, AJCC 8th Edition - Clinical stage from 03/23/2020: Stage IVA (cT4a, cN1, cM0) - Signed by Eppie Gibson, MD on 04/09/2020 Stage prefix: Initial diagnosis  04/15/2020 -  Chemotherapy    Patient is on Treatment Plan: HEAD/NECK CISPLATIN  Q7D        Interval History  Mr Ravenscroft is here for a follow up. He is doing so so in his own words. Feeling very tired, lost about 6 lbs since last visit Pain is reasonably well controlled on liquid hydrocodone. He got a refill last week from Dr Alen Blew. He denies any change in breathing, He has noted discharge from his trach, he doesn't think it increased in amounts, he also notes a foul odor to it. No fevers or chills. No change in bowel habits No urinary habits No new neurological complaints. No adverse effects from chemotherapy  MEDICAL HISTORY:  Past Medical History:  Diagnosis Date  . Coronary artery disease   . Dyspnea   . GERD (gastroesophageal reflux disease)    Phreesia 02/21/2020  . Ruptured lumbar intervertebral disc     SURGICAL HISTORY: Past Surgical History:  Procedure Laterality Date  . CARDIAC CATHETERIZATION     with stent placement  . IR GASTROSTOMY TUBE MOD SED  04/20/2020  . IR IMAGING GUIDED PORT INSERTION  04/20/2020  . TONSILLECTOMY    . TRACHEOSTOMY TUBE PLACEMENT N/A 03/05/2020   Procedure: AWAKE TRACHEOSTOMY, DIRECT LARYNGOSCOPY WITH BIOPSY;  Surgeon: Melida Quitter, MD;  Location: [facility];  Service: ENT;  Laterality: N/A;    SOCIAL HISTORY: Social History   Socioeconomic History  . Marital status: Married    Spouse name: Not on file  . Number of children: Not on file  . Years of education: Not on file  . Highest education level: Not on file  Occupational History  . Not on file  Tobacco Use  . Smoking status: Current Every Day Smoker    Types: Cigarettes  . Smokeless tobacco: Former Systems developer    Types: Secondary school teacher  . Vaping Use: Former  Substance and Sexual Activity  . Alcohol use: No  . Drug use: No  . Sexual activity: Yes  Other Topics Concern  . Not on file  Social History Narrative  . Not on file   Social Determinants of Health   Financial Resource Strain: High Risk  . Difficulty of Paying Living Expenses: Hard  Food  Insecurity: Food Insecurity Present  . Worried About Charity fundraiser in the Last Year: Sometimes true  . Ran Out of Food in the Last Year: Sometimes true  Transportation Needs: No Transportation Needs  . Lack of Transportation (Medical): No  . Lack of Transportation (Non-Medical): No  Physical Activity: Not on file  Stress: Not on file  Social Connections: Not on file  Intimate Partner Violence: Not on file    FAMILY HISTORY: History reviewed. No pertinent family history.  ALLERGIES:  has No Known Allergies.  MEDICATIONS:  Current Outpatient Medications  Medication Sig Dispense Refill  . dexamethasone (DECADRON) 4 MG tablet Take 2 tablets (8 mg total) by mouth daily. Take daily x 3 days starting the day after cisplatin chemotherapy. Take with food. 30 tablet 1  . HYDROcodone-acetaminophen (HYCET) 7.5-325 mg/15 ml solution Take 10 mLs by mouth every 6 (six) hours as needed for moderate pain or severe pain (on concurrent chemoradiation). 473 mL 0  . ibuprofen (ADVIL,MOTRIN) 200 MG tablet Take 800 mg by mouth every 8 (eight) hours as needed for mild pain. For pain (Patient not taking: Reported on 05/12/2020)    . lidocaine (XYLOCAINE) 2 % solution Patient: Mix 1part 2% viscous lidocaine, 1part H20.  Swallow 82mL of diluted mixture, 1min before meals and at bedtime, up to QID 200 mL 4  . lidocaine-prilocaine (EMLA) cream Apply to affected area once 30 g 3  . LORazepam (ATIVAN) 0.5 MG tablet Take 1 tablet (0.5 mg total) by mouth every 6 (six) hours as needed (Nausea or vomiting). 30 tablet 0  . Multiple Vitamin (MULTIVITAMIN WITH MINERALS) TABS tablet Take 1 tablet by mouth daily.    . Nutritional Supplements (FEEDING SUPPLEMENT, OSMOLITE 1.5 CAL,) LIQD Give 1 1/2 cartons of tube feeding at 8am, noon, 4pm and 8pm via tube.  Flush with 33ml of water before and after each feeding.  Must drink or give via tube 16oz bottle water for additional hydration. 1422 mL 0  . ondansetron (ZOFRAN) 8 MG  tablet Take 1 tablet (8 mg total) by mouth 2 (two) times daily as needed. Start on the third day after cisplatin chemotherapy. 30 tablet 1  . prochlorperazine (COMPAZINE) 10 MG tablet Take 1 tablet (10 mg total) by mouth every 6 (six) hours as needed (Nausea or vomiting). 30 tablet 1   No current facility-administered medications for this visit.     PHYSICAL EXAMINATION:  ECOG PERFORMANCE STATUS: 0 - Asymptomatic  Vitals:   05/26/20 0842  BP: 118/81  Pulse: (!) 102  Resp: 18  Temp: 97.7 F (36.5 C)  SpO2: 97%   Filed Weights   05/26/20 0842  Weight: 111 lb 4.8 oz (50.5 kg)    GENERAL:alert, no distress and comfortable NECK:  Tracheostomy in place,  Foul smelling discharge pus like appearing. LYMPH: No obvious palpable lymphadenopathy,  LUNGS: scattered bronchial breathing, trach capped today during visit. HEART: regular rate & rhythm and no murmurs and no lower extremity edema Musculoskeletal:no cyanosis of digits and no clubbing  PSYCH: alert & oriented x 3 with fluent speech NEURO: no focal motor/sensory deficits  LABORATORY DATA:  I have reviewed the data as listed Lab Results  Component Value Date   WBC 2.0 (L) 05/26/2020   HGB 10.3 (L) 05/26/2020   HCT 31.0 (L) 05/26/2020   MCV 94.8 05/26/2020   PLT 168 05/26/2020     Chemistry      Component Value Date/Time   NA 138 05/26/2020 0822   K 4.7 05/26/2020 0822   CL 94 (L) 05/26/2020 0822   CO2 32 05/26/2020 0822   BUN 35 (H) 05/26/2020 0822   CREATININE 0.78 05/26/2020 0822      Component Value Date/Time   CALCIUM 9.9 05/26/2020 0822   ALKPHOS 88 03/23/2020 1013   AST 24 03/23/2020 1013   ALT 41 03/23/2020 1013   BILITOT 0.3 03/23/2020 1013     CBC reviewed, leukopenia and neutropenia. Also there appears to be foul smelling discharge from the trach site, concerning for a local infection Will defer last cycle of cisplatin  RADIOGRAPHIC STUDIES: I have personally reviewed the radiological images as  listed and agreed with the findings in the report.  No results found. Labs from today reviewed, will defer treatment because of leukopenia and concern for infection.  All questions were answered. The patient knows to call the clinic with any problems, questions or concerns.     Benay Pike, MD 05/26/2020 9:41 AM

## 2020-05-26 NOTE — Telephone Encounter (Signed)
Patient's chemotherapy cancelled today. Contacted patient by telephone for nutrition follow up however, he was not available. Left name and phone number for return call.

## 2020-05-26 NOTE — Assessment & Plan Note (Signed)
Lost 6 lbs compared to last visit. He is using G tube for feeding, using about 7 cans a day He will talk to our nutrition team today

## 2020-05-26 NOTE — Assessment & Plan Note (Signed)
Pain well controlled on liquid hydrocontinue Continue Q 6 hrs PRN for pain control Last refilled 05/19/2020 PE, no severe oral mucositis.

## 2020-05-27 ENCOUNTER — Other Ambulatory Visit: Payer: Self-pay | Admitting: Hematology and Oncology

## 2020-05-27 ENCOUNTER — Ambulatory Visit
Admission: RE | Admit: 2020-05-27 | Discharge: 2020-05-27 | Disposition: A | Discharge status: Home / Self Care | Payer: 59 | Source: Ambulatory Visit | Attending: Radiation Oncology | Admitting: Radiation Oncology

## 2020-05-27 ENCOUNTER — Other Ambulatory Visit: Payer: Self-pay

## 2020-05-27 ENCOUNTER — Telehealth: Payer: Self-pay | Admitting: Hematology and Oncology

## 2020-05-27 DIAGNOSIS — C321 Malignant neoplasm of supraglottis: Secondary | ICD-10-CM

## 2020-05-27 MED ORDER — HYDROCODONE-ACETAMINOPHEN 7.5-325 MG/15ML PO SOLN: 10.0000 mL | Freq: Four times a day (QID) | ORAL | 0 refills | Status: DC | PRN

## 2020-05-27 NOTE — Telephone Encounter (Signed)
Scheduled appt per 4/28 sch msg. Pt aware.  

## 2020-05-28 ENCOUNTER — Other Ambulatory Visit: Payer: Self-pay

## 2020-05-28 ENCOUNTER — Encounter (HOSPITAL_COMMUNITY): Payer: Self-pay | Admitting: *Deleted

## 2020-05-28 ENCOUNTER — Ambulatory Visit
Admission: RE | Admit: 2020-05-28 | Discharge: 2020-05-28 | Disposition: A | Discharge status: Home / Self Care | Payer: 59 | Source: Ambulatory Visit | Attending: Radiation Oncology | Admitting: Radiation Oncology

## 2020-05-28 ENCOUNTER — Emergency Department (HOSPITAL_COMMUNITY)
Admission: EM | Admit: 2020-05-28 | Discharge: 2020-05-28 | Disposition: A | Discharge status: Home / Self Care | Payer: 59 | Source: Home / Self Care | Attending: Emergency Medicine | Admitting: Emergency Medicine

## 2020-05-28 DIAGNOSIS — F17210 Nicotine dependence, cigarettes, uncomplicated: Secondary | ICD-10-CM | POA: Insufficient documentation

## 2020-05-28 DIAGNOSIS — I2510 Atherosclerotic heart disease of native coronary artery without angina pectoris: Secondary | ICD-10-CM | POA: Insufficient documentation

## 2020-05-28 DIAGNOSIS — J9509 Other tracheostomy complication: Secondary | ICD-10-CM

## 2020-05-28 DIAGNOSIS — Z951 Presence of aortocoronary bypass graft: Secondary | ICD-10-CM | POA: Insufficient documentation

## 2020-05-28 DIAGNOSIS — J9503 Malfunction of tracheostomy stoma: Secondary | ICD-10-CM | POA: Insufficient documentation

## 2020-05-28 DIAGNOSIS — Z8521 Personal history of malignant neoplasm of larynx: Secondary | ICD-10-CM | POA: Insufficient documentation

## 2020-05-28 MED ORDER — ONDANSETRON HCL 4 MG/2ML IJ SOLN
4.0000 mg | Freq: Once | INTRAMUSCULAR | Status: AC
  Administered 2020-05-28: 4 mg via INTRAVENOUS
  Filled 2020-05-28: qty 2

## 2020-05-28 MED ORDER — HEPARIN SOD (PORK) LOCK FLUSH 100 UNIT/ML IV SOLN
500.0000 [IU] | Freq: Once | INTRAVENOUS | Status: AC
  Administered 2020-05-28: 500 [IU]
  Filled 2020-05-28: qty 5

## 2020-05-28 MED ORDER — SODIUM CHLORIDE 0.9 % IV BOLUS
500.0000 mL | Freq: Once | INTRAVENOUS | Status: AC
  Administered 2020-05-28: 500 mL via INTRAVENOUS

## 2020-05-28 NOTE — Discharge Instructions (Addendum)
Follow up with your care team, return to ER for concerns.

## 2020-05-28 NOTE — ED Triage Notes (Addendum)
Pt wife reports the patient has been having trouble moving air with trach and feels short of breath. He had radiation treatment to throat this morning, hx laryngeal cancer. He tried suctioning it w/o much change in symptoms.

## 2020-05-28 NOTE — ED Provider Notes (Signed)
[facility] DEPT Provider Note   CSN: [id number] Arrival date & time: 05/28/20  1239     History Chief Complaint  Patient presents with  . Shortness of Breath    Erik [name] is a 55 y.o. male.  55 year old male with history of supraglottic malignancy with trach presents with complaint of difficulty breathing secondary to obstructed trach. Patient attempted to clear his trach at home without success. Respiratory assessed patient on arrival, suctioned trach and patient is back to baseline at this time. Patient reports feeling nauseous, is scheduled for IV fluid infusion at the hospital tomorrow and requests his fluids be administered in the ER if possible. Patient was recently started on Doxycycline for concern for infection around his trach, states he has been compliant with antibiotics, secretions are unchanged (foul smelling, purulent). Had radiation on Wednesday, chemo was held due to concern for infection.        Past Medical History:  Diagnosis Date  . Coronary artery disease   . Dyspnea   . GERD (gastroesophageal reflux disease)    Phreesia 02/21/2020  . Ruptured lumbar intervertebral disc     Patient Active Problem List   Diagnosis Date Noted  . Mucositis due to antineoplastic therapy 05/06/2020  . Chemotherapy-induced nausea 04/28/2020  . Weight loss, unintentional 04/28/2020  . Dysgeusia 04/28/2020  . G tube feedings ([place]) 04/28/2020  . Nicotine abuse 04/21/2020  . Head and neck cancer ([place])   . Tracheostomy status ([place])   . Malnutrition of moderate degree 03/05/2020  . Malignant neoplasm of supraglottis ([place]) 03/04/2020    Past Surgical History:  Procedure Laterality Date  . CARDIAC CATHETERIZATION     with stent placement  . IR GASTROSTOMY TUBE MOD SED  04/20/2020  . IR IMAGING GUIDED PORT INSERTION  04/20/2020  . TONSILLECTOMY    . TRACHEOSTOMY TUBE PLACEMENT N/A 03/05/2020   Procedure: AWAKE TRACHEOSTOMY, DIRECT LARYNGOSCOPY  WITH BIOPSY;  Surgeon: Melida Quitter, MD;  Location: [facility];  Service: ENT;  Laterality: N/A;       No family history on file.  Social History   Tobacco Use  . Smoking status: Current Every Day Smoker    Types: Cigarettes  . Smokeless tobacco: Former Systems developer    Types: Secondary school teacher  . Vaping Use: Former  Substance Use Topics  . Alcohol use: No  . Drug use: No    Home Medications Prior to Admission medications   Medication Sig Start Date End Date Taking? Authorizing Provider  dexamethasone (DECADRON) 4 MG tablet Take 2 tablets (8 mg total) by mouth daily. Take daily x 3 days starting the day after cisplatin chemotherapy. Take with food. 04/13/20   Benay Pike, MD  doxycycline (VIBRA-TABS) 100 MG tablet Take 1 tablet (100 mg total) by mouth 2 (two) times daily. 05/26/20   Benay Pike, MD  HYDROcodone-acetaminophen (HYCET) 7.5-325 mg/15 ml solution Take 10 mLs by mouth every 6 (six) hours as needed for moderate pain or severe pain (on concurrent chemoradiation). 05/28/20   Benay Pike, MD  ibuprofen (ADVIL,MOTRIN) 200 MG tablet Take 800 mg by mouth every 8 (eight) hours as needed for mild pain. For pain Patient not taking: Reported on 05/12/2020    [provider]  lidocaine (XYLOCAINE) 2 % solution Patient: Mix 1part 2% viscous lidocaine, 1part H20. Swallow 67mL of diluted mixture, 43min before meals and at bedtime, up to QID 04/26/20   Eppie Gibson, MD  lidocaine-prilocaine (EMLA) cream Apply to affected area  once 04/13/20   Benay Pike, MD  LORazepam (ATIVAN) 0.5 MG tablet Take 1 tablet (0.5 mg total) by mouth every 6 (six) hours as needed (Nausea or vomiting). 04/28/20   Benay Pike, MD  Multiple Vitamin (MULTIVITAMIN WITH MINERALS) TABS tablet Take 1 tablet by mouth daily.    [provider]  Nutritional Supplements (FEEDING SUPPLEMENT, OSMOLITE 1.5 CAL,) LIQD Give 1 1/2 cartons of tube feeding at 8am, noon, 4pm and 8pm via tube.  Flush with 76ml of  water before and after each feeding.  Must drink or give via tube 16oz bottle water for additional hydration. 04/28/20   Eppie Gibson, MD  ondansetron (ZOFRAN) 8 MG tablet Take 1 tablet (8 mg total) by mouth 2 (two) times daily as needed. Start on the third day after cisplatin chemotherapy. 04/13/20   Benay Pike, MD  prochlorperazine (COMPAZINE) 10 MG tablet Take 1 tablet (10 mg total) by mouth every 6 (six) hours as needed (Nausea or vomiting). 04/13/20   Benay Pike, MD    Allergies    Patient has no known allergies.  Review of Systems   Review of Systems  Constitutional: Negative for fever.  Respiratory: Negative for shortness of breath.   Cardiovascular: Negative for chest pain.  Gastrointestinal: Positive for nausea. Negative for abdominal pain and vomiting.  Genitourinary: Negative for difficulty urinating.  Musculoskeletal: Negative for arthralgias and myalgias.  Skin: Negative for wound.  Allergic/Immunologic: Positive for immunocompromised state.  Neurological: Negative for weakness.  Hematological: Negative for adenopathy.  All other systems reviewed and are negative.   Physical Exam Updated Vital Signs BP 138/89   Pulse 87   Temp 98.3 F (36.8 C) (Oral)   Resp 20   SpO2 99%   Physical Exam Vitals and nursing note reviewed.  Constitutional:      Comments: Chronically ill appearing   HENT:     Head: Normocephalic and atraumatic.   Cardiovascular:     Rate and Rhythm: Normal rate and regular rhythm.     Heart sounds: Normal heart sounds.  Pulmonary:     Effort: Pulmonary effort is normal.  Skin:    General: Skin is warm and dry.     Findings: No erythema or rash.  Neurological:     Mental Status: He is alert and oriented to person, place, and time.  Psychiatric:        Behavior: Behavior normal.     ED Results / Procedures / Treatments   Labs (all labs ordered are listed, but only abnormal results are displayed) Labs Reviewed - No data to  display  EKG None  Radiology No results found.  Procedures Procedures   Medications Ordered in ED Medications  sodium chloride 0.9 % bolus 500 mL (500 mLs Intravenous New Bag/Given 05/28/20 1452)  ondansetron (ZOFRAN) injection 4 mg (4 mg Intravenous Given 05/28/20 1452)    ED Course  I have reviewed the triage vital signs and the nursing notes.  Pertinent labs & imaging results that were available during my care of the patient were reviewed by me and considered in my medical decision making (see chart for details).  Clinical Course as of 05/28/20 1619  Fri May 29, 6746  4732 55 year old male with concern for obstruction of his trach which was cleared by respiratory therapist on arrival.  Patient is scheduled for IV fluid bolus tomorrow, request is given today while he is at the hospital.  Order was verified as 1 L normal saline scheduled for tomorrow,  this is being administered with a dose of Zofran for his nausea.  Patient feels ready for discharge.  Given return to ER precautions. [LM]    Clinical Course User Index [LM] Roque Lias   MDM Rules/Calculators/A&P                          Final Clinical Impression(s) / ED Diagnoses Final diagnoses:  Tracheostomy obstruction [facility])    Rx / DC Orders ED Discharge Orders    None       Tacy Learn, PA-C 05/28/20 [place], Ankit, MD 05/28/20 2108

## 2020-05-29 ENCOUNTER — Inpatient Hospital Stay: Payer: 59

## 2020-05-31 ENCOUNTER — Inpatient Hospital Stay: Payer: 59 | Admitting: Nutrition

## 2020-05-31 ENCOUNTER — Encounter: Payer: 59 | Admitting: Nutrition

## 2020-05-31 ENCOUNTER — Ambulatory Visit
Admission: RE | Admit: 2020-05-31 | Discharge: 2020-05-31 | Disposition: A | Discharge status: Home / Self Care | Payer: 59 | Source: Ambulatory Visit | Attending: Radiation Oncology | Admitting: Radiation Oncology

## 2020-05-31 ENCOUNTER — Inpatient Hospital Stay: Payer: 59 | Attending: Medical | Admitting: Medical

## 2020-05-31 ENCOUNTER — Other Ambulatory Visit: Payer: Self-pay

## 2020-05-31 VITALS — BP 119/75 | HR 102 | Temp 96.9°F | Resp 18 | Wt 111.5 lb

## 2020-05-31 DIAGNOSIS — Z931 Gastrostomy status: Secondary | ICD-10-CM | POA: Insufficient documentation

## 2020-05-31 DIAGNOSIS — Z7952 Long term (current) use of systemic steroids: Secondary | ICD-10-CM | POA: Insufficient documentation

## 2020-05-31 DIAGNOSIS — F17210 Nicotine dependence, cigarettes, uncomplicated: Secondary | ICD-10-CM | POA: Diagnosis not present

## 2020-05-31 DIAGNOSIS — K1231 Oral mucositis (ulcerative) due to antineoplastic therapy: Secondary | ICD-10-CM | POA: Diagnosis not present

## 2020-05-31 DIAGNOSIS — Z9221 Personal history of antineoplastic chemotherapy: Secondary | ICD-10-CM | POA: Insufficient documentation

## 2020-05-31 DIAGNOSIS — R634 Abnormal weight loss: Secondary | ICD-10-CM | POA: Insufficient documentation

## 2020-05-31 DIAGNOSIS — K219 Gastro-esophageal reflux disease without esophagitis: Secondary | ICD-10-CM | POA: Insufficient documentation

## 2020-05-31 DIAGNOSIS — I2510 Atherosclerotic heart disease of native coronary artery without angina pectoris: Secondary | ICD-10-CM | POA: Diagnosis not present

## 2020-05-31 DIAGNOSIS — Z923 Personal history of irradiation: Secondary | ICD-10-CM | POA: Insufficient documentation

## 2020-05-31 DIAGNOSIS — C321 Malignant neoplasm of supraglottis: Secondary | ICD-10-CM | POA: Insufficient documentation

## 2020-05-31 DIAGNOSIS — Z79899 Other long term (current) drug therapy: Secondary | ICD-10-CM | POA: Insufficient documentation

## 2020-05-31 DIAGNOSIS — T451X5A Adverse effect of antineoplastic and immunosuppressive drugs, initial encounter: Secondary | ICD-10-CM | POA: Diagnosis not present

## 2020-05-31 DIAGNOSIS — Z791 Long term (current) use of non-steroidal anti-inflammatories (NSAID): Secondary | ICD-10-CM | POA: Diagnosis not present

## 2020-05-31 DIAGNOSIS — R490 Dysphonia: Secondary | ICD-10-CM | POA: Insufficient documentation

## 2020-05-31 NOTE — Progress Notes (Signed)
Nutrition follow-up completed with patient while he was in the symptom management clinic. Last weight documented was 111.3 pounds on April 27. Patient is status post 6 cycles of cisplatin for [place] cancer.  He will finish radiation therapy tomorrow. Reports IV fluids have been given secondary to nausea along with IV Zofran however he did not feel it helped much. Reports he has been giving 7 cartons of Osmolite 1.5 daily via PEG but not consistently. He has not been doing his swallowing exercises. Reports issues with constipation but is taking milk of magnesia.  He denies current problems at this time. He has no taste.  Nutrition needs: 2200-2500 cal, 100-110 g protein, 2.5 L fluid.  7 cartons Osmolite 1.5 provides 2485 cal and 104.3 g protein.  Nutrition diagnosis: Unintended weight loss continues.  Intervention: Consistently give 7 cartons Osmolite 1.5 daily via PEG with 60 mL free water before and after bolus feedings 4 times a day.  Continue an additional 16 ounces of water via PEG and fluids by mouth as tolerated. Recommended patient comply with swallowing exercise protocol as outlined by speech pathologist.  Educated on importance. Encouraged managing nausea and constipation with medications as needed. Educated patient to begin increasing water by mouth and then begin adding other thin liquids as tolerated.  Explained importance of beginning p.o. nutrition in order to wean tube feeding over the next 3 to 6 months.  Monitoring, evaluation, goals: Patient will tolerate tube feeding at goal rate along with oral intake to promote weight gain and healing.  Next visit to be scheduled in approximately 4 weeks.  **Disclaimer: This note was dictated with voice recognition software. Similar sounding words can inadvertently be transcribed and this note may contain transcription errors which may not have been corrected upon publication of note.**

## 2020-05-31 NOTE — Progress Notes (Signed)
[facility] Progress Note   Erik [name] [id number] [birth year] 55 y.o.  Erik [name] is managed by Dr. Benay Pike  Actively treated with chemotherapy/immunotherapy/hormonal therapy: yes  Current therapy: Concurrent chemoradiation with cisplatin  Last treated: 05/12/2020 (cycle 6, day 1)  Next scheduled appointment with provider: 06/04/2020  Assessment: Plan:    Malignant neoplasm of supraglottis ([place])   Malignant neoplasm of the glottis: Erik [name] presents to the clinic today as planned for consideration of cycle 7, day 1 of concurrent chemoradiation with the patient receiving cisplatin.  Erik last radiation treatment is scheduled for tomorrow. Unfortunately, no appointment was scheduled for Erik infusion today. Infusion was able to work him in today but the patient had another appointment already scheduled and could not stay today. He could return tomorrow but no infusion appointment was available. This was discussed with Dr. Chryl Heck. She reviewed Erik previous treatments and noted that Erik [name] received a total of 240 mg of cisplatin. She agreed that this weeks treatment could be dropped. He will have him return to see Dr. Chryl Heck as scheduled on 06/04/2020.  Erik labs from today show that Erik platelet count has rebounded to 168 today. This is up from 117. A WBC returned lower at 2.0 with an Bright at 1.6 today. Neutropenic precautions were reviewed with the patient and Erik [name].   Please see After Visit Summary for patient specific instructions.  Future Appointments  Date Time Provider [place]  05/31/2020 10:30 AM Karie Mainland, RD [facility] None  06/01/2020  9:45 AM [facility] [id number] [facility] None  06/03/2020  2:00 PM Cori Razor [facility]  06/04/2020  9:20 AM Benay Pike, MD [facility] None  06/22/2020 10:00 AM Breedlove Blue, Blaire L, PT [facility] None    No orders of the defined types were placed in this encounter.       Subjective:   Patient ID:  Erik [name] is a 55 y.o. (DOB 08-09-1965) male.  Chief Complaint: No chief complaint on file.   HPI Erik [name]  is a 55 y.o. male with a diagnosis of a T4 malignant neoplasm of the glottis.  Erik [name] is managed by Dr.Iruku and presents to the clinic today as planned for consideration of cycle 7, day 1 of concurrent chemoradiation with the patient receiving cisplatin.  Erik last radiation treatment is scheduled for tomorrow. Unfortunately, no appointment was scheduled for Erik infusion today. Infusion was able to work him in today but the patient had another appointment already scheduled and could not stay today. He could return tomorrow but no infusion appointment was available. This was discussed with Dr. Chryl Heck. She reviewed Erik previous treatments and noted that Erik [name] received a total of 240 mg of cisplatin. She agreed that this weeks treatment could be dropped. He continues to tolerate Erik treatment well overall except for Erik sore throat.  He denies nausea, vomiting, constipation, diarrhea, fevers, chills, or sweats.  He continues to intake food via Erik PEG tube.  He continues drinking small sips of water.  Erik weight is stable at 111 pounds today. Erik BUN was slightly high at 35 today. He and Erik [name] were told to increase Erik free fluid after Erik feedings. He was seen in the ER on 04/29 with an obstructed trach as outlined below:  55 year old male with history of supraglottic malignancy with trach presents with complaint of difficulty breathing secondary to obstructed trach. Patient attempted to clear Erik trach at home without success. Respiratory  assessed patient on arrival, suctioned trach and patient is back to baseline at this time. Patient reports feeling nauseous, is scheduled for IV fluid infusion at the hospital tomorrow and requests Erik fluids be administered in the ER if possible. Patient was recently started on Doxycycline for concern for infection  around Erik trach, states he has been compliant with antibiotics, secretions are unchanged (foul smelling, purulent). Had radiation on Wednesday, chemo was held due to concern for infection.   Medications: I have reviewed the patient's current medications.  Allergies: No Known Allergies  Past Medical History:  Diagnosis Date  . Coronary artery disease   . Dyspnea   . GERD (gastroesophageal reflux disease)    Phreesia 02/21/2020  . Ruptured lumbar intervertebral disc     Past Surgical History:  Procedure Laterality Date  . CARDIAC CATHETERIZATION     with stent placement  . IR GASTROSTOMY TUBE MOD SED  04/20/2020  . IR IMAGING GUIDED PORT INSERTION  04/20/2020  . TONSILLECTOMY    . TRACHEOSTOMY TUBE PLACEMENT N/A 03/05/2020   Procedure: AWAKE TRACHEOSTOMY, DIRECT LARYNGOSCOPY WITH BIOPSY;  Surgeon: Melida Quitter, MD;  Location: [facility];  Service: ENT;  Laterality: N/A;    No family history on file.  Social History   Socioeconomic History  . Marital status: Married    Spouse name: Not on file  . Number of children: Not on file  . Years of education: Not on file  . Highest education level: Not on file  Occupational History  . Not on file  Tobacco Use  . Smoking status: Current Every Day Smoker    Types: Cigarettes  . Smokeless tobacco: Former Systems developer    Types: Secondary school teacher  . Vaping Use: Former  Substance and Sexual Activity  . Alcohol use: No  . Drug use: No  . Sexual activity: Yes  Other Topics Concern  . Not on file  Social History Narrative  . Not on file   Social Determinants of Health   Financial Resource Strain: High Risk  . Difficulty of Paying Living Expenses: Hard  Food Insecurity: Food Insecurity Present  . Worried About Charity fundraiser in the Last Year: Sometimes true  . Ran Out of Food in the Last Year: Sometimes true  Transportation Needs: No Transportation Needs  . Lack of Transportation (Medical): No  . Lack of Transportation (Non-Medical):  No  Physical Activity: Not on file  Stress: Not on file  Social Connections: Not on file  Intimate Partner Violence: Not on file    Past Medical History, Surgical history, Social history, and Family history were reviewed and updated as appropriate.   Please see review of systems for further details on the patient's review from today.   Review of Systems:  Review of Systems  Constitutional: Negative for chills, diaphoresis and fever.  HENT: Positive for trouble swallowing. Negative for voice change.   Respiratory: Negative for cough, chest tightness, shortness of breath and wheezing.   Cardiovascular: Negative for chest pain and palpitations.  Gastrointestinal: Negative for abdominal pain, constipation, diarrhea, nausea and vomiting.  Musculoskeletal: Negative for back pain and myalgias.  Neurological: Negative for dizziness, light-headedness and headaches.    Objective:   Physical Exam:  There were no vitals taken for this visit. ECOG: 0  Physical Exam Constitutional:      General: He is not in acute distress.    Appearance: He is not diaphoretic.  HENT:     Head: Normocephalic and atraumatic.  Eyes:     General: No scleral icterus.       Right eye: No discharge.        Left eye: No discharge.     Conjunctiva/sclera: Conjunctivae normal.  Neck:   Cardiovascular:     Rate and Rhythm: Normal rate and regular rhythm.     Heart sounds: Normal heart sounds. No murmur heard. No friction rub. No gallop.   Pulmonary:     Effort: Pulmonary effort is normal. No respiratory distress.     Breath sounds: Normal breath sounds. No wheezing or rales.  Abdominal:    Skin:    General: Skin is warm and dry.     Findings: No erythema or rash.  Neurological:     Mental Status: He is alert.     Lab Review:     Component Value Date/Time   NA 138 05/26/2020 0822   K 4.7 05/26/2020 0822   CL 94 (L) 05/26/2020 0822   CO2 32 05/26/2020 0822   GLUCOSE 108 (H) 05/26/2020 0822    BUN 35 (H) 05/26/2020 0822   CREATININE 0.78 05/26/2020 0822   CALCIUM 9.9 05/26/2020 0822   PROT 7.3 03/23/2020 1013   ALBUMIN 3.3 (L) 03/23/2020 1013   AST 24 03/23/2020 1013   ALT 41 03/23/2020 1013   ALKPHOS 88 03/23/2020 1013   BILITOT 0.3 03/23/2020 1013   GFRNONAA >60 05/26/2020 0822       Component Value Date/Time   WBC 2.0 (L) 05/26/2020 0822   WBC 4.7 05/12/2020 0822   RBC 3.27 (L) 05/26/2020 0822   HGB 10.3 (L) 05/26/2020 0822   HCT 31.0 (L) 05/26/2020 0822   PLT 168 05/26/2020 0822   MCV 94.8 05/26/2020 0822   MCH 31.5 05/26/2020 0822   MCHC 33.2 05/26/2020 0822   RDW 14.9 05/26/2020 0822   LYMPHSABS 0.1 (L) 05/26/2020 0822   MONOABS 0.3 05/26/2020 0822   EOSABS 0.0 05/26/2020 0822   BASOSABS 0.0 05/26/2020 0822   -------------------------------  Imaging from last 24 hours (if applicable):  Radiology interpretation: No results found.      Case discussed with Dr. Chryl Heck.

## 2020-06-01 ENCOUNTER — Encounter: Payer: Self-pay | Admitting: Radiation Oncology

## 2020-06-01 ENCOUNTER — Ambulatory Visit
Admission: RE | Admit: 2020-06-01 | Discharge: 2020-06-01 | Disposition: A | Discharge status: Home / Self Care | Payer: 59 | Source: Ambulatory Visit | Attending: Radiation Oncology | Admitting: Radiation Oncology

## 2020-06-01 DIAGNOSIS — C321 Malignant neoplasm of supraglottis: Secondary | ICD-10-CM | POA: Diagnosis not present

## 2020-06-01 NOTE — Progress Notes (Signed)
Oncology Nurse Navigator Documentation     Provided written post-RT guidance:  Importance of keeping all follow-up appts, especially those with Nutrition and SLP.  Importance of protecting treatment area from sun.  Continuation of Sonafine application 2-3 times daily, application of antibiotic ointment to areas of raw skin; when supply of Sonafine exhausted transition to OTC lotion with vitamin E. Provided/reviewed Epic calendar of upcoming appts. Explained my role as navigator will continue for several more months, encouraged him to call me with needs/concerns.    Harlow Asa RN, BSN, OCN Head & Neck Oncology Nurse [place] [facility] Phone # [phone]  Fax # [phone]

## 2020-06-03 ENCOUNTER — Telehealth: Payer: Self-pay | Admitting: Hematology and Oncology

## 2020-06-03 ENCOUNTER — Other Ambulatory Visit: Payer: Self-pay | Admitting: Hematology and Oncology

## 2020-06-03 ENCOUNTER — Ambulatory Visit: Payer: 59 | Attending: Radiation Oncology

## 2020-06-03 ENCOUNTER — Other Ambulatory Visit: Payer: Self-pay

## 2020-06-03 DIAGNOSIS — R1310 Dysphagia, unspecified: Secondary | ICD-10-CM | POA: Diagnosis present

## 2020-06-03 DIAGNOSIS — C329 Malignant neoplasm of larynx, unspecified: Secondary | ICD-10-CM | POA: Diagnosis present

## 2020-06-03 DIAGNOSIS — R293 Abnormal posture: Secondary | ICD-10-CM | POA: Diagnosis present

## 2020-06-03 NOTE — Therapy (Signed)
[facility] [address] [place], [place], [address] Phone: [phone]   Fax:  [phone]  Speech Language Pathology Treatment  Patient Details  Name: Erik [name] MRN: [id number] Date of Birth: 1965/02/24 Referring Provider (SLP): Eppie Gibson, MD   Encounter Date: 06/03/2020   End of Session - 06/03/20 1456    Visit Number 2    Number of Visits 7    Date for SLP Re-Evaluation 08/04/20    SLP Start Time 1330    SLP Stop Time  1740    SLP Time Calculation (min) 37 min    Activity Tolerance Patient tolerated treatment well           Past Medical History:  Diagnosis Date  . Coronary artery disease   . Dyspnea   . GERD (gastroesophageal reflux disease)    Phreesia 02/21/2020  . Ruptured lumbar intervertebral disc     Past Surgical History:  Procedure Laterality Date  . CARDIAC CATHETERIZATION     with stent placement  . IR GASTROSTOMY TUBE MOD SED  04/20/2020  . IR IMAGING GUIDED PORT INSERTION  04/20/2020  . TONSILLECTOMY    . TRACHEOSTOMY TUBE PLACEMENT N/A 03/05/2020   Procedure: AWAKE TRACHEOSTOMY, DIRECT LARYNGOSCOPY WITH BIOPSY;  Surgeon: Melida Quitter, MD;  Location: [facility];  Service: ENT;  Laterality: N/A;    There were no vitals filed for this visit.   Subjective Assessment - 06/03/20 1449    Subjective "I did them (HEP) some."    Currently in Pain? Yes    Pain Score 4     Pain Location Throat    Pain Orientation Medial    Pain Descriptors / Indicators Sore                 ADULT SLP TREATMENT - 06/03/20 1450      General Information   Behavior/Cognition Alert;Cooperative;Pleasant mood      Treatment Provided   Treatment provided Dysphagia      Dysphagia Treatment   Temperature Spikes Noted No    Respiratory Status Trach    Treatment Methods Skilled observation;Therapeutic exercise;Compensation strategy training;Patient/caregiver education    Patient observed directly with  PO's Yes    Type of PO's observed Thin liquids   x3 sips   Liquids provided via Cup    Oral Phase Signs & Symptoms --   none noted   Pharyngeal Phase Signs & Symptoms Delayed throat clear   unsure if from thickened saliva or penetration/aspiration   Other treatment/comments SLP told pt to try 8 oz water/day unless he is coughing with water and then do thorough oral care and ice chips immediately after oral care. SLP also suggested cycling through HEP exercises as many times as pt can, instead of multiple reps of one exercise until fatigue. Pt req'd occasional min A with HEP today - he stated he completed x2-3/week since last ST. SLP reiterated rationale for HEP.      Assessment / Recommendations / Plan   Plan Continue with current plan of care      Progression Toward Goals   Progression toward goals Progressing toward goals            SLP Education - 06/03/20 1455    Education Details rationale for HEP, cycle through HEP instead of multiple reps of one then the next etc., if cough with water do thorough oral care then ice chips immediately following    Person(s) Educated Patient;Spouse    Methods Explanation;Demonstration;Verbal  cues    Comprehension Returned demonstration;Verbalized understanding;Verbal cues required;Need further instruction            SLP Short Term Goals - 06/03/20 1448      SLP SHORT TERM GOAL #1   Title pt will complete HEP with rare min A    Time 1    Period --   visits, for all STGs   Status On-going      SLP SHORT TERM GOAL #2   Title pt will tell SLP why pt is completing HEP with modified independence    Time 1    Status On-going      SLP SHORT TERM GOAL #3   Title pt will describe 3 overt s/s aspiration PNA with modified independence    Time 1    Status On-going      SLP SHORT TERM GOAL #4   Title pt will tell SLP how a food journal could hasten return to a more normalized diet    Time 1    Status On-going            SLP Long Term Goals  - 06/03/20 1459      SLP LONG TERM GOAL #1   Title pt will complete HEP with modified independence over two visits    Time 2    Period --   visits, for all LTGs   Status On-going      SLP LONG TERM GOAL #2   Title pt will describe how to modify HEP over time, over two sessions    Time 3    Status On-going      SLP LONG TERM GOAL #3   Title pt will describe the timeline associated with reduction in HEP frequency with modified independence    Time 5    Status On-going            Plan - 06/03/20 1456    Clinical Impression Statement At this time pt swallowing is deemed Erik [name] with water - ? delayed throat clear due to thickened saliva or due to penetration/aspiration. Pt clearing throat/coughing during session due to thick saliva. SLP reviewed the individualized HEP for dysphagia and pt completed each exercise on their own with occasional min cues faded to modified independent. Pt has not completed HEP with frequency recommended by SLP. Erik [name] is having very very little by mouth at this time - see "other commnets" for details re: PO recommendation and other details of today's session. There are no overt s/s aspiration PNA reported by pt at this time. Data indicate that pt's swallow ability will likely decrease over the course of chemoradiation therapy and could very well decline over time following conclusion of their chemoradiation therapy due to muscle disuse atrophy and/or muscle fibrosis. Pt will cont to need to be seen by SLP in order to assess safety of PO intake, assess the need for recommending any objective swallow assessment, and ensuring pt correctly completes the individualized HEP.    Speech Therapy Frequency --   once approx every 4 weeks   Duration --   7 total visits   Treatment/Interventions Aspiration precaution training;Pharyngeal strengthening exercises;Diet toleration management by SLP;Patient/family education;Trials of upgraded texture/liquids;SLP instruction and  feedback;Compensatory strategies    Potential to Achieve Goals Good    SLP Home Exercise Plan provided    Consulted and Agree with Plan of Care Patient           Patient will benefit from skilled therapeutic intervention in order to  improve the following deficits and impairments:   Dysphagia, unspecified type    Problem List Patient Active Problem List   Diagnosis Date Noted  . Mucositis due to antineoplastic therapy 05/06/2020  . Chemotherapy-induced nausea 04/28/2020  . Weight loss, unintentional 04/28/2020  . Dysgeusia 04/28/2020  . G tube feedings ([place]) 04/28/2020  . Nicotine abuse 04/21/2020  . Head and neck cancer ([place])   . History of tracheostomy 03/16/2020  . Malnutrition of moderate degree 03/05/2020  . 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

## 2020-06-03 NOTE — Telephone Encounter (Signed)
Scheduled appt per 5/5 sch msg. Pt aware.  

## 2020-06-04 ENCOUNTER — Encounter: Payer: Self-pay | Admitting: Hematology and Oncology

## 2020-06-04 ENCOUNTER — Inpatient Hospital Stay (HOSPITAL_BASED_OUTPATIENT_CLINIC_OR_DEPARTMENT_OTHER): Payer: 59 | Admitting: Hematology and Oncology

## 2020-06-04 DIAGNOSIS — K1231 Oral mucositis (ulcerative) due to antineoplastic therapy: Secondary | ICD-10-CM

## 2020-06-04 DIAGNOSIS — R110 Nausea: Secondary | ICD-10-CM | POA: Diagnosis not present

## 2020-06-04 DIAGNOSIS — R634 Abnormal weight loss: Secondary | ICD-10-CM | POA: Diagnosis not present

## 2020-06-04 DIAGNOSIS — T451X5A Adverse effect of antineoplastic and immunosuppressive drugs, initial encounter: Secondary | ICD-10-CM

## 2020-06-04 DIAGNOSIS — C329 Malignant neoplasm of larynx, unspecified: Secondary | ICD-10-CM

## 2020-06-04 DIAGNOSIS — C321 Malignant neoplasm of supraglottis: Secondary | ICD-10-CM | POA: Diagnosis not present

## 2020-06-04 NOTE — Assessment & Plan Note (Signed)
Improving. He is now using Hydrocodone 2 to 3 times a day. He will keep [name] posted if he needs a refill on this.

## 2020-06-04 NOTE — Assessment & Plan Note (Signed)
Weight has remained stable since last visit. He is using 7 cans of osmolite via G tube. We will continue to monitor this.

## 2020-06-04 NOTE — Assessment & Plan Note (Signed)
This is not expected to continue anymore since he hasnt received chemo in over 2 weeks. He can use compazine as needed if he still needs nausea medication. We will not be refilling ativan at this time.

## 2020-06-04 NOTE — Assessment & Plan Note (Signed)
[id number] Updated staging. He declined surgery. 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 If he has incomplete response with chemo radiation he will proceed with salvage surgery. He completed 5 weekly cycles of cisplatin. No chemotherapy related adverse effects that he reports We offered to complete C6 earlier this week but he didn't want to do it that day. He already finished radiation, hence we will not proceed with any more chemo. Overall he received 200 mg/m2 of cisplatin. Clinically he responded very well

## 2020-06-04 NOTE — Progress Notes (Signed)
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 He declined surgery. 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 If he has incomplete response with chemo radiation he will proceed with salvage surgery. He completed 5 weekly cycles of cisplatin. No chemotherapy related adverse effects that he reports We offered to complete C6 earlier this week but he didn't want to do it that day. He already finished radiation, hence we will not proceed with any more chemo. Overall he received 200 mg/m2 of cisplatin. Clinically he responded very well  Mucositis due to antineoplastic therapy Improving. He is now using Hydrocodone 2 to 3 times a day. He will keep [name] posted if he needs a refill on this.  Weight loss, unintentional Weight has remained stable since last visit. He is using 7 cans of osmolite via G tube. We will continue to monitor this.  Chemotherapy-induced nausea This is not expected to continue anymore since he hasnt received chemo in over 2 weeks. He can use compazine as needed if he still needs nausea medication. We will not be refilling ativan at this time.  No orders of the defined types were placed in this encounter.    HISTORY OF PRESENTING ILLNESS:  Erik [name] 55 y.o. male is here because of supraglottic cancer s.p chemoradiation  Oncology History Overview Note  Chronology  Pt had  noticed ongoing hoarseness for almost 6 months, and was seen by a doctor who suggested that it may be related to GERD He used medication for GERD but his hoarseness continued to worsen. He then started getting SOB and went to the ER. He was found to have airway obstruction and underwent elective tracheostomy and direct laryngoscopy with biopsy.  Pertinent imaging and pathology results so far.  03/04/2020  Mucosal irregularity and mucosal/submucosal edema of the supraglottic and glottic larynx. Primary differential considerations include infectious/inflammatory laryngeal edema versus laryngeal malignancy. ENT consultation and direct visualization recommended. Associated mild-to-moderate narrowing, and mild rightward deviation, of the airway at this level.   Additional edema within the cutaneous/subcutaneous ventral neck.   Mildly enlarged left level 2/3 lymph node which may be reactive or could reflect metastatic nodal disease.   Nonspecific 1.4 cm part cystic/part solid cutaneous/subcutaneous lesion within the midline upper neck. Direct visualization recommended.   03/05/2020  FINAL MICROSCOPIC DIAGNOSIS:   A. LEFT VOCAL CORD, EXCISION:  - Invasive moderately differentiated squamous cell carcinoma, see  comment   B. RIGHT VOCAL CORD, EXCISION:  - Invasive moderately differentiated squamous cell carcinoma, see  comment   C. LEFT FALSE VOCAL CORD, EXCISION:  - Invasive moderately differentiated squamous cell carcinoma, see  comment   P 16 positive He is now on weekly cisplatin. He completed 6 weekly cycles of cisplatin   Laryngeal cancer ([place])  03/04/2020 Initial Diagnosis   Malignant neoplasm of supraglottis ([place])   03/23/2020 Cancer Staging   Staging form: Larynx - Supraglottis, AJCC 8th Edition - Clinical stage from 03/23/2020: Stage IVA (cT4a, cN1, cM0) - Signed by  Eppie Gibson, MD on 04/09/2020 Stage prefix: Initial diagnosis   04/15/2020 -  Chemotherapy    Patient is  on Treatment Plan: HEAD/NECK CISPLATIN Q7D         INTERIM HISTORY  Ms Vandenberghe is here for follow up since he completed chemoradiation. He is feeling better, still cant eat anything. He is using G tube for feeding. Pain mostly in throat, he is using pain medication a couple times a day approximately. He has been taking ativan once a day, says its for nausea. No change in bowel habits No change in urinary habits Breathing is better except when trach is plugged. He is hoping trach can be removed Rest of the pertinent 10 point ROS reviewed and negative.  REVIEW OF SYSTEMS:   Constitutional: Denies fevers, chills or abnormal night sweats Eyes: Denies blurriness of vision, double vision or watery eyes Ears, nose, mouth, throat, and face: Denies mucositis or sore throat Respiratory: Denies cough, dyspnea or wheezes Cardiovascular: Denies palpitation, chest discomfort or lower extremity swelling Gastrointestinal:  Denies nausea, heartburn or change in bowel habits Skin: Denies abnormal skin rashes Lymphatics: Denies new lymphadenopathy or easy bruising Neurological:Denies numbness, tingling or new weaknesses Behavioral/Psych: Mood is stable, no new changes  All other systems were reviewed with the patient and are negative.   MEDICAL HISTORY:  Past Medical History:  Diagnosis Date  . Coronary artery disease   . Dyspnea   . GERD (gastroesophageal reflux disease)    Phreesia 02/21/2020  . Ruptured lumbar intervertebral disc     SURGICAL HISTORY: Past Surgical History:  Procedure Laterality Date  . CARDIAC CATHETERIZATION     with stent placement  . IR GASTROSTOMY TUBE MOD SED  04/20/2020  . IR IMAGING GUIDED PORT INSERTION  04/20/2020  . TONSILLECTOMY    . TRACHEOSTOMY TUBE PLACEMENT N/A 03/05/2020   Procedure: AWAKE TRACHEOSTOMY, DIRECT LARYNGOSCOPY WITH BIOPSY;  Surgeon: Melida Quitter, MD;  Location: [facility];  Service: ENT;  Laterality: N/A;    SOCIAL HISTORY: Social History    Socioeconomic History  . Marital status: Married    Spouse name: Not on file  . Number of children: Not on file  . Years of education: Not on file  . Highest education level: Not on file  Occupational History  . Not on file  Tobacco Use  . Smoking status: Current Every Day Smoker    Types: Cigarettes  . Smokeless tobacco: Former Systems developer    Types: Secondary school teacher  . Vaping Use: Former  Substance and Sexual Activity  . Alcohol use: No  . Drug use: No  . Sexual activity: Yes  Other Topics Concern  . Not on file  Social History Narrative  . Not on file   Social Determinants of Health   Financial Resource Strain: High Risk  . Difficulty of Paying Living Expenses: Hard  Food Insecurity: Food Insecurity Present  . Worried About Charity fundraiser in the Last Year: Sometimes true  . Ran Out of Food in the Last Year: Sometimes true  Transportation Needs: No Transportation Needs  . Lack of Transportation (Medical): No  . Lack of Transportation (Non-Medical): No  Physical Activity: Not on file  Stress: Not on file  Social Connections: Not on file  Intimate Partner Violence: Not on file    FAMILY HISTORY: No family history on file.  ALLERGIES:  has No Known Allergies.  MEDICATIONS:  Current Outpatient Medications  Medication Sig Dispense Refill  . dexamethasone (DECADRON) 4 MG  tablet Take 2 tablets (8 mg total) by mouth daily. Take daily x 3 days starting the day after cisplatin chemotherapy. Take with food. 30 tablet 1  . doxycycline (VIBRA-TABS) 100 MG tablet Take 1 tablet (100 mg total) by mouth 2 (two) times daily. 14 tablet 0  . HYDROcodone-acetaminophen (HYCET) 7.5-325 mg/15 ml solution Take 10 mLs by mouth every 6 (six) hours as needed for moderate pain or severe pain (on concurrent chemoradiation). 473 mL 0  . lidocaine (XYLOCAINE) 2 % solution Patient: Mix 1part 2% viscous lidocaine, 1part H20. Swallow 4mL of diluted mixture, 39min before meals and at bedtime, up  to QID 200 mL 4  . Multiple Vitamin (MULTIVITAMIN WITH MINERALS) TABS tablet Take 1 tablet by mouth daily.    . Nutritional Supplements (FEEDING SUPPLEMENT, OSMOLITE 1.5 CAL,) LIQD Give 1 1/2 cartons of tube feeding at 8am, noon, 4pm and 8pm via tube.  Flush with 66ml of water before and after each feeding.  Must drink or give via tube 16oz bottle water for additional hydration. 1422 mL 0  . ondansetron (ZOFRAN) 8 MG tablet Take 1 tablet (8 mg total) by mouth 2 (two) times daily as needed. Start on the third day after cisplatin chemotherapy. 30 tablet 1  . prochlorperazine (COMPAZINE) 10 MG tablet Take 1 tablet (10 mg total) by mouth every 6 (six) hours as needed (Nausea or vomiting). 30 tablet 1  . ibuprofen (ADVIL,MOTRIN) 200 MG tablet Take 800 mg by mouth every 8 (eight) hours as needed for mild pain. For pain (Patient not taking: No sig reported)    . lidocaine-prilocaine (EMLA) cream Apply to affected area once 30 g 3   No current facility-administered medications for this visit.   PHYSICAL EXAMINATION: ECOG PERFORMANCE STATUS: 1 - Symptomatic but completely ambulatory  Vitals:   06/04/20 0930  BP: 126/88  Pulse: (!) 108  Resp: 20  Temp: (!) 97.5 F (36.4 C)  SpO2: 100%   Filed Weights   06/04/20 0930  Weight: 112 lb 1.6 oz (50.8 kg)    GENERAL:alert, no distress and comfortable SKIN: skin color, texture, turgor are normal, no rashes or significant lesions EYES: normal, conjunctiva are pink and non-injected, sclera clear OROPHARYNX:no exudate, no erythema and lips, buccal mucosa, and tongue normal  NECK: supple, thyroid normal size, non-tender, without nodularity LYMPH:  no palpable lymphadenopathy in the cervical, axillary or inguinal LUNGS: clear to auscultation and percussion with normal breathing effort HEART: regular rate & rhythm and no murmurs and no lower extremity edema ABDOMEN:abdomen soft, non-tender and normal bowel sounds Musculoskeletal:no cyanosis of digits and  no clubbing  PSYCH: alert & oriented x 3 with fluent speech NEURO: no focal motor/sensory deficits  LABORATORY DATA:  I have reviewed the data as listed Lab Results  Component Value Date   WBC 2.0 (L) 05/26/2020   HGB 10.3 (L) 05/26/2020   HCT 31.0 (L) 05/26/2020   MCV 94.8 05/26/2020   PLT 168 05/26/2020     Chemistry      Component Value Date/Time   NA 138 05/26/2020 0822   K 4.7 05/26/2020 0822   CL 94 (L) 05/26/2020 0822   CO2 32 05/26/2020 0822   BUN 35 (H) 05/26/2020 0822   CREATININE 0.78 05/26/2020 0822      Component Value Date/Time   CALCIUM 9.9 05/26/2020 0822   ALKPHOS 88 03/23/2020 1013   AST 24 03/23/2020 1013   ALT 41 03/23/2020 1013   BILITOT 0.3 03/23/2020 1013  RADIOGRAPHIC STUDIES: I have personally reviewed the radiological images as listed and agreed with the findings in the report. No results found.  All questions were answered. The patient knows to call the clinic with any problems, questions or concerns.   Benay Pike, MD 06/04/2020 7:22 PM

## 2020-06-07 ENCOUNTER — Telehealth: Payer: Self-pay | Admitting: Hematology and Oncology

## 2020-06-07 NOTE — Telephone Encounter (Signed)
Scheduled appts per 5/9 sch msg. Called pt, no answer. Left msg with appts date and times.

## 2020-06-08 ENCOUNTER — Inpatient Hospital Stay: Payer: 59 | Admitting: Nutrition

## 2020-06-08 ENCOUNTER — Other Ambulatory Visit: Payer: Self-pay | Admitting: Hematology and Oncology

## 2020-06-08 ENCOUNTER — Inpatient Hospital Stay: Payer: 59

## 2020-06-08 ENCOUNTER — Inpatient Hospital Stay: Payer: 59 | Admitting: Hematology and Oncology

## 2020-06-08 MED ORDER — HYDROCODONE-ACETAMINOPHEN 7.5-325 MG/15ML PO SOLN: 10.0000 mL | Freq: Three times a day (TID) | ORAL | 0 refills | Status: DC | PRN

## 2020-06-08 NOTE — Progress Notes (Signed)
Prescription for hycet sent to Patient pharmacy of choice. Recommended he now take it only 2-3 times a day, this should last him about 16 days if he uses its 3 times a day He understands that we will be tapering this medication off in about 3/4 weeks and it will not be a long term pain medication.  Erik [name]

## 2020-06-09 ENCOUNTER — Other Ambulatory Visit (HOSPITAL_COMMUNITY): Payer: Self-pay

## 2020-06-09 ENCOUNTER — Other Ambulatory Visit: Payer: Self-pay

## 2020-06-09 ENCOUNTER — Telehealth: Payer: Self-pay

## 2020-06-09 DIAGNOSIS — K1231 Oral mucositis (ulcerative) due to antineoplastic therapy: Secondary | ICD-10-CM

## 2020-06-09 MED ORDER — HYDROCODONE-ACETAMINOPHEN 7.5-325 MG/15ML PO SOLN
10.0000 mL | Freq: Three times a day (TID) | ORAL | 0 refills | Status: DC | PRN
  Filled 2020-06-09: qty 383, 13d supply, fill #0
  Filled 2020-06-09: qty 90, 3d supply, fill #0

## 2020-06-09 NOTE — Telephone Encounter (Signed)
Returned call requesting pain medication prescription to be switched to [facility] outpatient pharmacy. Informed patient that refill has been sent. Verbalized understanding and appreciation.

## 2020-06-10 ENCOUNTER — Other Ambulatory Visit (HOSPITAL_COMMUNITY): Payer: Self-pay

## 2020-06-11 ENCOUNTER — Other Ambulatory Visit (HOSPITAL_COMMUNITY): Payer: Self-pay

## 2020-06-16 ENCOUNTER — Other Ambulatory Visit: Payer: Self-pay

## 2020-06-16 ENCOUNTER — Ambulatory Visit
Admission: RE | Admit: 2020-06-16 | Discharge: 2020-06-16 | Disposition: A | Discharge status: Home / Self Care | Payer: 59 | Source: Ambulatory Visit | Attending: Radiation Oncology | Admitting: Radiation Oncology

## 2020-06-16 VITALS — BP 114/74 | HR 110 | Temp 97.8°F | Resp 20 | Ht 69.0 in | Wt 113.0 lb

## 2020-06-16 DIAGNOSIS — Z79899 Other long term (current) drug therapy: Secondary | ICD-10-CM | POA: Insufficient documentation

## 2020-06-16 DIAGNOSIS — C329 Malignant neoplasm of larynx, unspecified: Secondary | ICD-10-CM

## 2020-06-16 DIAGNOSIS — C321 Malignant neoplasm of supraglottis: Secondary | ICD-10-CM | POA: Insufficient documentation

## 2020-06-16 DIAGNOSIS — Z923 Personal history of irradiation: Secondary | ICD-10-CM | POA: Insufficient documentation

## 2020-06-16 NOTE — Progress Notes (Signed)
Mr. Gonce presents today for follow-up after completing radiation to his supraglottis on 06/01/2020  Pain issues, if any: Report lingering throat pain, but states it has greatly improved since completing treatment. States it mainly occurs when he tries to swallow Using a feeding tube?: Yes--reports he's instilling 7 cartons of Osmolite daily without difficulty Weight changes, if any:  Wt Readings from Last 3 Encounters:  06/16/20 113 lb (51.3 kg)  06/04/20 112 lb 1.6 oz (50.8 kg)  05/31/20 111 lb 8 oz (50.6 kg)   Swallowing issues, if any: Yes--still unable to tolerate solid foods, though he hopes to try pudding or jello today. He is able to tolerate thin liquids without difficulty  Smoking or chewing tobacco? None Using fluoride trays daily? N/A Last ENT visit was on: Not since diagnosis Other notable issues, if any: Had F/U with his medical oncologist Dr. Benay Pike on 06/04/2020. Continues to deal with daily fatigue. Denies any ear or jaw pain, or difficulty opening his mouth. Denies any signs or symptoms of lymphedema to his chin or neck. Reports sense of taste is slowly returning, but states he continues to struggle with thick saliva/phlegm. Reports skin in treatment field is healing well.   Vitals:   06/16/20 1123  BP: 114/74  Pulse: (!) 110  Resp: 20  Temp: 97.8 F (36.6 C)  SpO2: 96%

## 2020-06-18 ENCOUNTER — Encounter: Payer: Self-pay | Admitting: Radiation Oncology

## 2020-06-18 NOTE — Progress Notes (Signed)
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   Erik [name] presents today for follow-up after completing radiation to his supraglottis on 06/01/2020  Pain issues, if any: Report lingering throat pain, but states it has greatly improved since completing treatment. States it mainly occurs when he tries to swallow Using a feeding tube?: Yes--reports he's instilling 7 cartons of Osmolite daily without difficulty Weight changes, if any:  Wt Readings from Last 3 Encounters:  06/16/20 113 lb (51.3 kg)  06/04/20 112 lb 1.6 oz (50.8 kg)  05/31/20 111 lb 8 oz (50.6 kg)   Swallowing issues, if any: Yes--still unable to tolerate solid foods, though he hopes to try pudding or jello today. He is able to tolerate thin liquids without difficulty  Smoking or chewing tobacco? None Using fluoride trays daily? N/A Last ENT visit was on: Not since diagnosis Other notable issues, if any: Had F/U with his medical oncologist Dr. Benay Pike on 06/04/2020. Continues to deal with daily fatigue. Denies any ear or jaw pain, or difficulty opening his mouth. Denies any signs or symptoms of lymphedema to his chin or neck. Reports sense of taste is slowly returning, but states he continues to struggle with thick saliva/phlegm. Reports skin in treatment field is healing well.   Vitals:   06/16/20 1123  BP: 114/74  Pulse: (!)  110  Resp: 20  Temp: 97.8 F (36.6 C)  SpO2: 96%                       ALLERGIES:  has No Known Allergies.  Meds: Current Outpatient Medications  Medication Sig Dispense Refill  . dexamethasone (DECADRON) 4 MG tablet Take 2 tablets (8 mg total) by mouth daily. Take daily x 3 days starting the day after cisplatin chemotherapy. Take with food. 30 tablet 1  . doxycycline (VIBRA-TABS) 100 MG tablet Take 1 tablet (100 mg total) by mouth 2 (two) times daily. 14 tablet 0  . HYDROcodone-acetaminophen (HYCET) 7.5-325 mg/15 ml solution Take 10 mLs by mouth every 8 (eight) hours as needed for moderate pain or severe pain (on concurrent chemoradiation). 473 mL 0  . ibuprofen (ADVIL,MOTRIN) 200 MG tablet Take 800 mg by mouth every 8 (eight) hours as needed for mild pain. For pain (Patient not taking: No sig reported)    . lidocaine (XYLOCAINE) 2 % solution Patient: Mix 1part 2% viscous lidocaine, 1part H20. Swallow 15mL of diluted mixture, 80min before meals and at bedtime, up to QID 200 mL 4  . lidocaine-prilocaine (EMLA) cream Apply to affected area once 30 g 3  . Multiple Vitamin (MULTIVITAMIN WITH MINERALS) TABS tablet Take 1 tablet by mouth daily.    . Nutritional Supplements (FEEDING SUPPLEMENT, OSMOLITE 1.5 CAL,) LIQD Give 1 1/2 cartons of tube feeding at 8am, noon, 4pm and 8pm via tube.  Flush with 64ml of water before and after each feeding.  Must drink  or give via tube 16oz bottle water for additional hydration. 1422 mL 0  . ondansetron (ZOFRAN) 8 MG tablet Take 1 tablet (8 mg total) by mouth 2 (two) times daily as needed. Start on the third day after cisplatin chemotherapy. 30 tablet 1  . prochlorperazine (COMPAZINE) 10 MG tablet Take 1 tablet (10 mg total) by mouth every 6 (six) hours as needed (Nausea or vomiting). 30 tablet 1   No current facility-administered medications for this encounter.    Physical Findings: The patient is in no acute distress. Patient is alert and oriented. Wt  Readings from Last 3 Encounters:  06/16/20 113 lb (51.3 kg)  06/04/20 112 lb 1.6 oz (50.8 kg)  05/31/20 111 lb 8 oz (50.6 kg)    height is 5\' 9"  (1.753 m) and weight is 113 lb (51.3 kg). His temperature is 97.8 F (36.6 C). His blood pressure is 114/74 and his pulse is 110 (abnormal). His respiration is 20 and oxygen saturation is 96%. .  General: Alert and oriented, in no acute distress HEENT: Head is normocephalic. Extraocular movements are intact. Oropharynx/oral cavity is notable for less thick saliva in mouth.  No active mucositis or thrush visible on physical exam Neck: Neck is notable for intact trach device, moist desquamation has healed around the stoma Skin: Skin in treatment fields shows satisfactory healing thus far around the stoma particularly.  He still has dry hyperpigmented skin in the treatment fields Psychiatric: Judgment and insight are intact. Affect is appropriate.   Lab Findings: Lab Results  Component Value Date   WBC 2.0 (L) 05/26/2020   HGB 10.3 (L) 05/26/2020   HCT 31.0 (L) 05/26/2020   MCV 94.8 05/26/2020   PLT 168 05/26/2020    Lab Results  Component Value Date   TSH 1.605 03/23/2020    Radiographic Findings: No results found.  Impression/Plan:    1) Head and Neck Cancer Status: Healing well from radiation therapy.  I recommended that he resume using the wound care cream over his skin twice a day in the treatment fields given his dry skin  2) Nutritional Status: Maintaining his weight PEG tube: Continue using for supplementation and advance oral diet as tolerated under the supervision of speech-language pathology  3) Risk Factors: The patient has been educated about risk factors including alcohol and tobacco abuse; they understand that avoidance of alcohol and tobacco is important to prevent recurrences as well as other cancers, he is abstaining from tobacco and I applauded him for this  4) Swallowing: Continue advancing diet as supervised by  speech-language pathology  5) Dental: Encouraged to continue regular followup with dentistry, and dental hygiene including fluoride rinses.   6) Thyroid function: Check annually with medical oncology Lab Results  Component Value Date   TSH 1.605 03/23/2020    7) Other: Follow-up in 3 months with restaging PET scan - our navigator will arrange scheduling  On date of service, in total, I spent 15 minutes on this encounter. Patient was seen in person. _____________________________________   Eppie Gibson, MD

## 2020-06-21 ENCOUNTER — Other Ambulatory Visit: Payer: Self-pay

## 2020-06-21 DIAGNOSIS — C329 Malignant neoplasm of larynx, unspecified: Secondary | ICD-10-CM

## 2020-06-22 ENCOUNTER — Ambulatory Visit: Payer: 59 | Admitting: Physical Therapy

## 2020-06-22 ENCOUNTER — Other Ambulatory Visit: Payer: Self-pay

## 2020-06-22 ENCOUNTER — Encounter: Payer: Self-pay | Admitting: Physical Therapy

## 2020-06-22 DIAGNOSIS — R293 Abnormal posture: Secondary | ICD-10-CM

## 2020-06-22 DIAGNOSIS — C329 Malignant neoplasm of larynx, unspecified: Secondary | ICD-10-CM

## 2020-06-22 DIAGNOSIS — R1310 Dysphagia, unspecified: Secondary | ICD-10-CM | POA: Diagnosis not present

## 2020-06-22 NOTE — Therapy (Signed)
[place], [place], [address] Phone: [phone]   Fax:  [phone]  Physical Therapy Treatment  Patient Details  Name: Erik [name] MRN: [id number] Date of Birth: Jan 04, 1966 Referring Provider (PT): Reita May Date: 06/22/2020   PT End of Session - 06/22/20 1021    Visit Number 2    Number of Visits 2    Date for PT Re-Evaluation 07/01/20    PT Start Time 1003    PT Stop Time 1016    PT Time Calculation (min) 13 min    Activity Tolerance Patient tolerated treatment well    Behavior During Therapy [facility] for tasks assessed/performed           Past Medical History:  Diagnosis Date  . Coronary artery disease   . Dyspnea   . GERD (gastroesophageal reflux disease)    Phreesia 02/21/2020  . Ruptured lumbar intervertebral disc     Past Surgical History:  Procedure Laterality Date  . CARDIAC CATHETERIZATION     with stent placement  . IR GASTROSTOMY TUBE MOD SED  04/20/2020  . IR IMAGING GUIDED PORT INSERTION  04/20/2020  . TONSILLECTOMY    . TRACHEOSTOMY TUBE PLACEMENT N/A 03/05/2020   Procedure: AWAKE TRACHEOSTOMY, DIRECT LARYNGOSCOPY WITH BIOPSY;  Surgeon: Melida Quitter, MD;  Location: [facility];  Service: ENT;  Laterality: N/A;    There were no vitals filed for this visit.   Subjective Assessment - 06/22/20 1005    Subjective I am starting to feel better. My fatigue level is getting better.    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 P16 +, 03/04/20 CT scan revealed mucosal irregularity and mucosal/submucosal edema of the supraglottic and glottic larynx. Primary differential considerations included infectious/inflammatory laryngeal edema versus laryngeal malignancy. There was also noted to be associated mild-to-moderate narrowing, and mild rightward deviation, of the airway at that level.  There was additional edema within the cutaneous/subcutaneous ventral neck. Additionally, there was a mildly enlarged left level 2/3 lymph node, which may have reactive or could have reflected metastatic nodal disease. Finally, there was a non-specific 1.4 cm part cystic/part solid cutaneous/subcutaneous lesion within the midline upper neck, 03/05/20 Dr Redmond Baseman then performed an awake tracheostomy and direct laryngoscopy with biopsy. 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 p16(+), will receive 35 fractions to his supraglottis and bilateral neck and weekly cisplatin ([place]) . He started on 04/14/20 and will complete on 06/01/20, 04/20/20 PEG/PAC placed    Patient Stated Goals to gain info from provider    Currently in Pain? No/denies    Pain Score 0-No pain              [facility] PT Assessment - 06/22/20 0001      Sit to Stand   Comments 30 sec sit to stand - 17 reps - 17 is average for his age - some shortness of breath      AROM   Cervical Flexion WFL   slightly limited due to trach   Cervical Extension WFL   slightly limited due to trach   Cervical - Right [place] [facility]    Cervical - Left Side Bend [facility]    Cervical - Right Rotation [facility]    Cervical - Left Rotation [facility]  LYMPHEDEMA/ONCOLOGY QUESTIONNAIRE - 06/22/20 0001      Lymphedema Assessments   Lymphedema Assessments Head and Neck      Head and Neck   Other not measured due to trach but therapist assessed and no signs of edema are present today                              PT Education - 06/22/20 1027    Education Details importance of walking to reduce fatigue, when returning to work start slowly and build up hours overtime to help decrease fatigue    Person(s) Educated Patient    Methods Explanation    Comprehension Verbalized understanding               PT Long Term Goals - 06/22/20 1026       PT LONG TERM GOAL #1   Title Pt will return to baseline cervical ROM and not demonstrate any signs or symptoms of lymphedema.    Time 8    Period Weeks    Status Achieved                 Plan - 06/22/20 1042    Clinical Impression Statement Pt returns to PT following completion of radiation and chemotherapy. He reports he is doing well and is starting to have less fatigue. Pt's cervical ROM is still WFL - some very slight end range tightness most likely due to trach in direction of flexion and extension. Educated pt to stretch in to these directions to help decrease any tightness. Pt does not demonstrate any signs or symptoms of lymphedema. Educated pt that if he notices any swelling in his anterior neck in the future to let his doctor know so she can refer him back to PT. Pt also able to complete 17 sit to stands compared to 16 at eval in 30 seconds which is average for his age. Pt will be discharged from skilled PT services at this time as pt has met goal for therapy.    PT Frequency --   eval and 1 f/u visit   PT Duration 8 weeks    PT Treatment/Interventions ADLs/Self Care Home Management;Patient/family education;Therapeutic exercise    PT Next Visit Plan reassess baselines    PT Home Exercise Plan head and neck ROM exercises    Consulted and Agree with Plan of Care Patient           Patient will benefit from skilled therapeutic intervention in order to improve the following deficits and impairments:  Pain,Postural dysfunction  Visit Diagnosis: Abnormal posture  Carcinoma larynx ([place])     Problem List Patient Active Problem List   Diagnosis Date Noted  . Mucositis due to antineoplastic therapy 05/06/2020  . Chemotherapy-induced nausea 04/28/2020  . Weight loss, unintentional 04/28/2020  . Dysgeusia 04/28/2020  . G tube feedings ([place]) 04/28/2020  . Nicotine abuse 04/21/2020  . Head and neck cancer ([place])   . History of tracheostomy 03/16/2020  . Malnutrition  of moderate degree 03/05/2020  . Laryngeal cancer [facility]) 03/04/2020    Allyson Sabal [facility] 06/22/2020, 10:46 AM  [place], [place], [address] Phone: [phone]   Fax:  [phone]  Name: Erik [name] MRN: [id number] Date of Birth: [birth year]  Manus Gunning, PT 06/22/20 10:46 AM  PHYSICAL THERAPY DISCHARGE SUMMARY  Visits from Start of Care: 2  Current functional level related to goals /  functional outcomes: Goal met   Remaining deficits: None   Education / Equipment: HEP  Plan: Patient agrees to discharge.  Patient goals were met. Patient is being discharged due to meeting the stated rehab goals.  ?????    Allyson Sabal [place], [place] 06/22/20 10:46 AM

## 2020-06-30 ENCOUNTER — Telehealth: Payer: Self-pay | Admitting: Nutrition

## 2020-06-30 ENCOUNTER — Inpatient Hospital Stay: Payer: 59 | Attending: Hematology and Oncology | Admitting: Nutrition

## 2020-06-30 NOTE — Telephone Encounter (Signed)
Telephone follow up completed with patient. He is s/p treatment for Larynx cancer on May 3. He is safe for thin liquids per SLP and has tried Jello and pudding. Reports he consistently infuses 7 cartons of Osmolite 1.5 via PEG. Weight improved slightly to 113 pounds on May 18 from 111.5 pounds on May 2. Noted taste improving. He has no questions or concerns.  Educated patient to continue 7 cartons of Osmolite 1.5 via feeding tube daily with additional 16 oz water daily. Flush tube with 60 mL water before and after bolus feeding. Educated to begin thin liquids as tolerated often throughout the day. Educated on soft solids such as applesauce, ice cream, pudding and mashed potatoes with gravy. Patient verbalized understanding.   Follow up call in ~4 weeks.

## 2020-07-01 ENCOUNTER — Other Ambulatory Visit: Payer: Self-pay

## 2020-07-01 ENCOUNTER — Ambulatory Visit: Payer: 59 | Attending: Radiation Oncology

## 2020-07-01 DIAGNOSIS — R1310 Dysphagia, unspecified: Secondary | ICD-10-CM | POA: Insufficient documentation

## 2020-07-01 NOTE — Therapy (Signed)
[place] [address] [place], [place], [address] Phone: [phone]   Fax:  [phone]  Speech Language Pathology Treatment  Patient Details  Name: Erik [name] MRN: [id number] Date of Birth: 04/17/1965 Referring Provider (SLP): Eppie Gibson, MD   Encounter Date: 07/01/2020   End of Session - 07/01/20 1551    Visit Number 3    Number of Visits 7    Date for SLP Re-Evaluation 08/04/20    SLP Start Time 1450    SLP Stop Time  1522    SLP Time Calculation (min) 32 min    Activity Tolerance Patient tolerated treatment well           Past Medical History:  Diagnosis Date  . Coronary artery disease   . Dyspnea   . GERD (gastroesophageal reflux disease)    Phreesia 02/21/2020  . Ruptured lumbar intervertebral disc     Past Surgical History:  Procedure Laterality Date  . CARDIAC CATHETERIZATION     with stent placement  . IR GASTROSTOMY TUBE MOD SED  04/20/2020  . IR IMAGING GUIDED PORT INSERTION  04/20/2020  . TONSILLECTOMY    . TRACHEOSTOMY TUBE PLACEMENT N/A 03/05/2020   Procedure: AWAKE TRACHEOSTOMY, DIRECT LARYNGOSCOPY WITH BIOPSY;  Surgeon: Melida Quitter, MD;  Location: [facility];  Service: ENT;  Laterality: N/A;    There were no vitals filed for this visit.   Subjective Assessment - 07/01/20 1507    Subjective Pt reports performed HEP less than previous session. Is drinking approx one cup tea or water/day - very minimial solids.    Currently in Pain? No/denies                 ADULT SLP TREATMENT - 07/01/20 1513      Treatment Provided   Treatment provided Dysphagia      Dysphagia Treatment   Temperature Spikes Noted Yes   99.9 last week for approx 24 hours.   Respiratory Status Trach    Treatment Methods Skilled observation;Therapeutic exercise;Patient/caregiver education    Patient observed directly with PO's Yes    Type of PO's observed Dysphagia 1 (puree);Thin liquids    Liquids  provided via Cup    Oral Phase Signs & Symptoms --   none noted today   Pharyngeal Phase Signs & Symptoms Other (comment)   none noted today   Other treatment/comments Erik [name] told SLP he has water or tea every day, and conveyed to SLP rationale for HEP with modified independence; He told SLP 3 overt s/sx aspiration PNA with modified independence, and todl SLP the reason for food journal. He had water and applesauce today - no overt s/sx aspiration with this so SLP recommended dys I-II foods with small sips thin liquids in order to incr POs and decr PEG feeds to ultimately have pt be safely independent with POs without PEG. SLP provided min A with HEP.      Assessment / Recommendations / Plan   Plan Continue with current plan of care      Dysphagia Recommendations   Diet recommendations Dysphagia 1 (puree);Thin liquid;Dysphagia 2 (fine chop)    Liquids provided via Cup    Medication Administration --   as tolerated   Compensations Small sips/bites      Progression Toward Goals   Progression toward goals Progressing toward goals            SLP Education - 07/01/20 1550    Education Details overt s/sx aspiration PNA, benefits  of food journal    Person(s) Educated Patient;Spouse    Methods Explanation;Handout    Comprehension Verbalized understanding            SLP Short Term Goals - 07/01/20 1544      SLP SHORT TERM GOAL #1   Title pt will complete HEP with rare min A    Period --   visits, for all STGs   Status Achieved      SLP SHORT TERM GOAL #2   Title pt will tell SLP why pt is completing HEP with modified independence    Status Achieved      SLP SHORT TERM GOAL #3   Title pt will describe 3 overt s/s aspiration PNA with modified independence    Status Achieved      SLP SHORT TERM GOAL #4   Title pt will tell SLP how a food journal could hasten return to a more normalized diet    Status Achieved            SLP Long Term Goals - 07/01/20 1552      SLP LONG TERM  GOAL #1   Title pt will complete HEP with modified independence over two visits    Time 1    Period --   visits, for all LTGs   Status On-going      SLP LONG TERM GOAL #2   Title pt will describe how to modify HEP over time, over two sessions    Time 2    Status On-going      SLP LONG TERM GOAL #3   Title pt will describe the timeline associated with reduction in HEP frequency with modified independence    Time 4    Status On-going            Plan - 07/01/20 1541    Clinical Impression Statement At this time pt swallowing is deemed [facility] with water and pureed solids. SLP encouraged pt to eat pureed solids more often each day even though it may not taste good. Not throat clearing during session today. SLP reviewed pt's individualized HEP for dysphagia and pt completed each exercise on their own with rare min cues faded to modified independent. Pt has again not completed HEP with frequency recommended by SLP. Erik [name] is having more liquids by mouth this month - see "other commnets" for details of today's session. There are no overt s/s aspiration PNA reported by pt at this time. Data indicate that pt's swallow ability will likely decrease over the course of chemoradiation therapy and could very well decline over time following conclusion of their chemoradiation therapy due to muscle disuse atrophy and/or muscle fibrosis. Pt will cont to need to be seen by SLP in order to assess safety of PO intake, assess the need for recommending any objective swallow assessment, and ensuring pt correctly completes the individualized HEP.    Speech Therapy Frequency --   once approx every 4 weeks   Duration --   7 total visits   Treatment/Interventions Aspiration precaution training;Pharyngeal strengthening exercises;Diet toleration management by SLP;Patient/family education;Trials of upgraded texture/liquids;SLP instruction and feedback;Compensatory strategies    Potential to Achieve Goals Good    SLP Home  Exercise Plan provided    Consulted and Agree with Plan of Care Patient           Patient will benefit from skilled therapeutic intervention in order to improve the following deficits and impairments:   Dysphagia, unspecified type  Problem List Patient Active Problem List   Diagnosis Date Noted  . Mucositis due to antineoplastic therapy 05/06/2020  . Chemotherapy-induced nausea 04/28/2020  . Weight loss, unintentional 04/28/2020  . Dysgeusia 04/28/2020  . G tube feedings ([place]) 04/28/2020  . Nicotine abuse 04/21/2020  . Head and neck cancer ([place])   . History of tracheostomy 03/16/2020  . Malnutrition of moderate degree 03/05/2020  . Laryngeal cancer ([place]) 03/04/2020    [facility] ,[place], [place]  07/01/2020, 3:52 PM  [facility] [place] [address] [place], [place], [address] Phone: [phone]   Fax:  [phone]   Name: Erik [name] MRN: [id number] Date of Birth: 11-13-1965

## 2020-08-03 ENCOUNTER — Ambulatory Visit: Payer: 59

## 2020-08-03 ENCOUNTER — Encounter: Payer: Self-pay | Admitting: Hematology and Oncology

## 2020-08-03 ENCOUNTER — Other Ambulatory Visit: Payer: Self-pay

## 2020-08-03 ENCOUNTER — Inpatient Hospital Stay (HOSPITAL_BASED_OUTPATIENT_CLINIC_OR_DEPARTMENT_OTHER): Payer: 59 | Admitting: Hematology and Oncology

## 2020-08-03 ENCOUNTER — Inpatient Hospital Stay: Payer: 59 | Attending: Hematology and Oncology

## 2020-08-03 VITALS — BP 118/81 | HR 86 | Temp 97.6°F | Resp 19 | Ht 69.0 in | Wt 120.2 lb

## 2020-08-03 DIAGNOSIS — Z931 Gastrostomy status: Secondary | ICD-10-CM

## 2020-08-03 DIAGNOSIS — C321 Malignant neoplasm of supraglottis: Secondary | ICD-10-CM | POA: Diagnosis not present

## 2020-08-03 DIAGNOSIS — R600 Localized edema: Secondary | ICD-10-CM | POA: Diagnosis not present

## 2020-08-03 DIAGNOSIS — C329 Malignant neoplasm of larynx, unspecified: Secondary | ICD-10-CM | POA: Diagnosis not present

## 2020-08-03 DIAGNOSIS — Z9889 Other specified postprocedural states: Secondary | ICD-10-CM | POA: Diagnosis not present

## 2020-08-03 DIAGNOSIS — Z95828 Presence of other vascular implants and grafts: Secondary | ICD-10-CM | POA: Insufficient documentation

## 2020-08-03 LAB — CBC WITH DIFFERENTIAL (CANCER CENTER ONLY)
Abs Immature Granulocytes: 0.04 10*3/uL (ref 0.00–0.07)
Basophils Absolute: 0 10*3/uL (ref 0.0–0.1)
Basophils Relative: 0 %
Eosinophils Absolute: 0.2 10*3/uL (ref 0.0–0.5)
Eosinophils Relative: 2 %
HCT: 31.7 % — ABNORMAL LOW (ref 39.0–52.0)
Hemoglobin: 10.1 g/dL — ABNORMAL LOW (ref 13.0–17.0)
Immature Granulocytes: 0 %
Lymphocytes Relative: 4 %
Lymphs Abs: 0.4 10*3/uL — ABNORMAL LOW (ref 0.7–4.0)
MCH: 30.7 pg (ref 26.0–34.0)
MCHC: 31.9 g/dL (ref 30.0–36.0)
MCV: 96.4 fL (ref 80.0–100.0)
Monocytes Absolute: 0.9 10*3/uL (ref 0.1–1.0)
Monocytes Relative: 9 %
Neutro Abs: 8.4 10*3/uL — ABNORMAL HIGH (ref 1.7–7.7)
Neutrophils Relative %: 85 %
Platelet Count: 483 10*3/uL — ABNORMAL HIGH (ref 150–400)
RBC: 3.29 MIL/uL — ABNORMAL LOW (ref 4.22–5.81)
RDW: 14.6 % (ref 11.5–15.5)
WBC Count: 9.9 10*3/uL (ref 4.0–10.5)
nRBC: 0 % (ref 0.0–0.2)

## 2020-08-03 LAB — BASIC METABOLIC PANEL - CANCER CENTER ONLY
Anion gap: 9 (ref 5–15)
BUN: 25 mg/dL — ABNORMAL HIGH (ref 6–20)
CO2: 29 mmol/L (ref 22–32)
Calcium: 9.8 mg/dL (ref 8.9–10.3)
Chloride: 99 mmol/L (ref 98–111)
Creatinine: 0.69 mg/dL (ref 0.61–1.24)
GFR, Estimated: >60 mL/min (ref >60)
Glucose, Bld: 120 mg/dL — ABNORMAL HIGH (ref 70–99)
Potassium: 4 mmol/L (ref 3.5–5.1)
Sodium: 137 mmol/L (ref 135–145)

## 2020-08-03 LAB — MAGNESIUM: Magnesium: 1.9 mg/dL (ref 1.7–2.4)

## 2020-08-03 MED ORDER — HEPARIN SOD (PORK) LOCK FLUSH 100 UNIT/ML IV SOLN
500.0000 [IU] | Freq: Once | INTRAVENOUS | Status: AC
  Administered 2020-08-03: 500 [IU]
  Filled 2020-08-03: qty 5

## 2020-08-03 MED ORDER — METHYLPREDNISOLONE 4 MG PO TBPK: ORAL_TABLET | ORAL | 0 refills | Status: DC

## 2020-08-03 MED ORDER — SODIUM CHLORIDE 0.9% FLUSH
10.0000 mL | Freq: Once | INTRAVENOUS | Status: AC
  Administered 2020-08-03: 10 mL
  Filled 2020-08-03: qty 10

## 2020-08-03 NOTE — Progress Notes (Signed)
[place] FOLLOW UP NOTE  Patient Care Team: [name], NP as PCP - General (Nurse Practitioner) Melida Quitter, MD as Consulting Physician (Otolaryngology) Benay Pike, MD as Consulting Physician (Hematology and Oncology) Eppie Gibson, MD as Consulting Physician (Radiation Oncology) Malmfelt, Stephani Police, RN as Oncology Nurse Navigator  CHIEF COMPLAINTS/PURPOSE OF CONSULTATION:  Follow up after completion of chemotherapy  ASSESSMENT & PLAN:  Laryngeal cancer [facility]) This is a very pleasant 55 year old male patient with past medical history significant for 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  Since his last visit, his mucositis has completely resolved.  He has gained about 8 pounds of weight and is using his G-tube for most of the feedings. His repeat PET scan or end of treatment PET scan is due in August and he has a follow-up with Dr. Isidore Moos after the PET/CT. He continues to be dependent on the tracheostomy for now given the ongoing laryngeal edema.  He had a follow-up with Dr. Redmond Baseman recently when he was found to have no evidence of cancer on the larynx. If PET/CT without any concerning for disease recurrence or residual disease, he can follow-up with [name] in December with labs including CBC, CMP and TSH.  History of tracheostomy He has pain around the trach site and has been using ibuprofen about 3 times a day.  We will try course of Solu-Medrol or Medrol Dosepak to see if this will help the laryngeal edema.   G tube feedings ([place]) Continue G-tube feedings, referral will be placed to speech and language therapist given concern for aspiration to proceed with oral feedings.  Orders Placed This Encounter  Procedures   CBC with Differential/Platelet    Standing Status:   Standing    Number of Occurrences:   22    Standing Expiration Date:   08/03/2021   CMP ([place] only)     Standing Status:   Future    Standing Expiration Date:   08/03/2021   TSH    Standing Status:   Standing    Number of Occurrences:   22    Standing Expiration Date:   08/03/2021   SLP eval and treat    Laryngeal ca, s.p chemoradiation, has trach, concern for aspiration with oral feeds.    Standing Status:   Future    Standing Expiration Date:   08/03/2021      HISTORY OF PRESENTING ILLNESS:  Erik [name] 55 y.o. male is here because of supraglottic cancer s.p chemoradiation  Oncology History Overview Note  Chronology  Pt had noticed ongoing hoarseness for almost 6 months, and was seen by a doctor who suggested that it may be related to GERD He used medication for GERD but his hoarseness continued to worsen. He then started getting SOB and went to the ER. He was found to have airway obstruction and underwent elective tracheostomy and direct laryngoscopy with biopsy.  Pertinent imaging and pathology results so far.  03/04/2020  Mucosal irregularity and mucosal/submucosal edema of the supraglottic and glottic larynx. Primary differential considerations include infectious/inflammatory laryngeal edema versus laryngeal malignancy. ENT consultation and direct visualization recommended. Associated mild-to-moderate narrowing, and mild rightward deviation, of the airway at this level.   Additional edema within the cutaneous/subcutaneous ventral neck.   Mildly enlarged left level 2/3 lymph node which may be reactive or could reflect metastatic nodal disease.   Nonspecific 1.4 cm part cystic/part solid cutaneous/subcutaneous lesion within the midline upper  neck. Direct visualization recommended.   03/05/2020  FINAL MICROSCOPIC DIAGNOSIS:   A. LEFT VOCAL CORD, EXCISION:  - Invasive moderately differentiated squamous cell carcinoma, see  comment   B. RIGHT VOCAL CORD, EXCISION:  - Invasive moderately differentiated squamous cell carcinoma, see  comment   C. LEFT FALSE VOCAL CORD,  EXCISION:  - Invasive moderately differentiated squamous cell carcinoma, see  comment   P 16 positive He is now on weekly cisplatin. He completed 6 weekly cycles of cisplatin   Laryngeal cancer ([place])  03/04/2020 Initial Diagnosis   Malignant neoplasm of supraglottis ([place])    03/23/2020 Cancer Staging   Staging form: Larynx - Supraglottis, AJCC 8th Edition - Clinical stage from 03/23/2020: Stage IVA (cT4a, cN1, cM0) - Signed by Eppie Gibson, MD on 04/09/2020  Stage prefix: Initial diagnosis    04/15/2020 -  Chemotherapy    Patient is on Treatment Plan: HEAD/NECK CISPLATIN Q7D         INTERIM HISTORY  Ms [name] is here for follow up since he completed chemoradiation.  Since last visit, he continues to improve.  He recently had a follow-up with Dr. Redmond Baseman, had a laryngoscopy which showed no evidence of tumor however was noted to have laryngeal edema likely from treatment. He is using his G-tube for most of the feedings, gained about 8 pounds since his last visit. He continues to have some discomfort around the trachea, take some ibuprofen on a daily basis.  No more mucositis, neuropathy or hearing loss. Rest of the pertinent 10 point ROS reviewed and neg.  MEDICAL HISTORY:  Past Medical History:  Diagnosis Date   Coronary artery disease    Dyspnea    GERD (gastroesophageal reflux disease)    Phreesia 02/21/2020   Ruptured lumbar intervertebral disc     SURGICAL HISTORY: Past Surgical History:  Procedure Laterality Date   CARDIAC CATHETERIZATION     with stent placement   IR GASTROSTOMY TUBE MOD SED  04/20/2020   IR IMAGING GUIDED PORT INSERTION  04/20/2020   TONSILLECTOMY     TRACHEOSTOMY TUBE PLACEMENT N/A 03/05/2020   Procedure: AWAKE TRACHEOSTOMY, DIRECT LARYNGOSCOPY WITH BIOPSY;  Surgeon: Melida Quitter, MD;  Location: [facility];  Service: ENT;  Laterality: N/A;    SOCIAL HISTORY: Social History   Socioeconomic History   Marital status: Married    Spouse name: Not on  file   Number of children: Not on file   Years of education: Not on file   Highest education level: Not on file  Occupational History   Not on file  Tobacco Use   Smoking status: Every Day    Pack years: 0.00    Types: Cigarettes   Smokeless tobacco: Former    Types: Nurse, children's Use: Former  Substance and Sexual Activity   Alcohol use: No   Drug use: No   Sexual activity: Yes  Other Topics Concern   Not on file  Social History Narrative   Not on file   Social Determinants of Health   Financial Resource Strain: High Risk   Difficulty of Paying Living Expenses: Hard  Food Insecurity: Food Insecurity Present   Worried About Estate manager/land agent of Food in the Last Year: Sometimes true   Arboriculturist in the Last Year: Sometimes true  Transportation Needs: No Transportation Needs   Lack of Transportation (Medical): No   Lack of Transportation (Non-Medical): No  Physical Activity: Not on file  Stress: Not on file  Social Connections: Not on file  Intimate Partner Violence: Not on file    FAMILY HISTORY: No family history on file.  ALLERGIES:  has No Known Allergies.  MEDICATIONS:  Current Outpatient Medications  Medication Sig Dispense Refill   doxycycline (VIBRA-TABS) 100 MG tablet Take 1 tablet (100 mg total) by mouth 2 (two) times daily. 14 tablet 0   HYDROcodone-acetaminophen (HYCET) 7.5-325 mg/15 ml solution Take 10 mLs by mouth every 8 (eight) hours as needed for moderate pain or severe pain (on concurrent chemoradiation). 473 mL 0   lidocaine (XYLOCAINE) 2 % solution Patient: Mix 1part 2% viscous lidocaine, 1part H20. Swallow 44mL of diluted mixture, 78min before meals and at bedtime, up to QID 200 mL 4   lidocaine-prilocaine (EMLA) cream Apply to affected area once 30 g 3   methylPREDNISolone (MEDROL DOSEPAK) 4 MG TBPK tablet Take by mouth as directed. 6 tablets on first day followed by 5 on day 2 , 4 on day 3, 3 on day 4, 2 on day 5 and 1 on day 6 21 each  0   Multiple Vitamin (MULTIVITAMIN WITH MINERALS) TABS tablet Take 1 tablet by mouth daily.     Nutritional Supplements (FEEDING SUPPLEMENT, OSMOLITE 1.5 CAL,) LIQD Give 1 1/2 cartons of tube feeding at 8am, noon, 4pm and 8pm via tube.  Flush with 42ml of water before and after each feeding.  Must drink or give via tube 16oz bottle water for additional hydration. 1422 mL 0   ondansetron (ZOFRAN) 8 MG tablet Take 1 tablet (8 mg total) by mouth 2 (two) times daily as needed. Start on the third day after cisplatin chemotherapy. 30 tablet 1   prochlorperazine (COMPAZINE) 10 MG tablet Take 1 tablet (10 mg total) by mouth every 6 (six) hours as needed (Nausea or vomiting). 30 tablet 1   ibuprofen (ADVIL,MOTRIN) 200 MG tablet Take 800 mg by mouth every 8 (eight) hours as needed for mild pain. For pain (Patient not taking: Reported on 08/03/2020)     No current facility-administered medications for this visit.   PHYSICAL EXAMINATION: ECOG PERFORMANCE STATUS: 1 - Symptomatic but completely ambulatory  Vitals:   08/03/20 1218  BP: 118/81  Pulse: 86  Resp: 19  Temp: 97.6 F (36.4 C)  SpO2: 96%    Filed Weights   08/03/20 1218  Weight: 120 lb 3.2 oz (54.5 kg)     GENERAL:alert, no distress and comfortable SKIN: skin color, texture, turgor are normal, no rashes or significant lesions EYES: normal, conjunctiva are pink and non-injected, sclera clear OROPHARYNX:no exudate, no erythema and lips, buccal mucosa, and tongue normal  NECK: supple, thyroid normal size, non-tender, without nodularity LYMPH:  no palpable lymphadenopathy in the cervical, axillary or inguinal LUNGS: clear to auscultation and percussion with normal breathing effort HEART: regular rate & rhythm and no murmurs and no lower extremity edema ABDOMEN:abdomen soft, non-tender and normal bowel sounds Musculoskeletal:no cyanosis of digits and no clubbing  PSYCH: alert & oriented x 3 with fluent speech NEURO: no focal motor/sensory  deficits  LABORATORY DATA:  I have reviewed the data as listed Lab Results  Component Value Date   WBC 9.9 08/03/2020   HGB 10.1 (L) 08/03/2020   HCT 31.7 (L) 08/03/2020   MCV 96.4 08/03/2020   PLT 483 (H) 08/03/2020     Chemistry      Component Value Date/Time   NA 137 08/03/2020 1157   K 4.0 08/03/2020 1157   CL 99 08/03/2020 1157   CO2  29 08/03/2020 1157   BUN 25 (H) 08/03/2020 1157   CREATININE 0.69 08/03/2020 1157      Component Value Date/Time   CALCIUM 9.8 08/03/2020 1157   ALKPHOS 88 03/23/2020 1013   AST 24 03/23/2020 1013   ALT 41 03/23/2020 1013   BILITOT 0.3 03/23/2020 1013       RADIOGRAPHIC STUDIES: I have personally reviewed the radiological images as listed and agreed with the findings in the report. No results found.  All questions were answered. The patient knows to call the clinic with any problems, questions or concerns.   Benay Pike, MD 08/03/2020 1:55 PM

## 2020-08-03 NOTE — Assessment & Plan Note (Signed)
Continue G-tube feedings, referral will be placed to speech and language therapist given concern for aspiration to proceed with oral feedings.

## 2020-08-03 NOTE — Assessment & Plan Note (Addendum)
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  Since his last visit, his mucositis has completely resolved.  He has gained about 8 pounds of weight and is using his G-tube for most of the feedings. His repeat PET scan or end of treatment PET scan is due in August and he has a follow-up with Dr. Isidore Moos after the PET/CT. He continues to be dependent on the tracheostomy for now given the ongoing laryngeal edema.  He had a follow-up with Dr. Redmond Baseman recently when he was found to have no evidence of cancer on the larynx. If PET/CT without any concerning for disease recurrence or residual disease, he can follow-up with [name] in December with labs including CBC, CMP and TSH.

## 2020-08-03 NOTE — Assessment & Plan Note (Signed)
He has pain around the trach site and has been using ibuprofen about 3 times a day.  We will try course of Solu-Medrol or Medrol Dosepak to see if this will help the laryngeal edema.

## 2020-08-04 ENCOUNTER — Ambulatory Visit: Payer: 59 | Attending: Radiation Oncology

## 2020-08-09 ENCOUNTER — Ambulatory Visit: Payer: 59 | Admitting: Nutrition

## 2020-08-09 NOTE — Progress Notes (Signed)
Nutrition telephone follow-up completed with patient status post treatment for [place] cancer on May 3.  Chart reviewed and noted patient's weight improved to 120.2 pounds on July 5 increased from 113 pounds May 18.  Patient is status post swallow evaluation with recommendations for a D1/D2 diet and small sips of thin liquids.  Patient was encouraged at that time to increase small amounts of food throughout the day despite taste.  Patient reported drinking 1 cup of tea or water every day.  He continues to use Osmolite 1.5 via PEG and states he is using 7 cartons daily.  Patient was educated to try to increase food intake along with liquid intake as noted by speech pathologist.  Stressed importance of increasing oral intake so tube feeding could be decreased.  Patient agreeable to in person appointments for more detailed follow-up on Tuesday, August 16.  **Disclaimer: This note was dictated with voice recognition software. Similar sounding words can inadvertently be transcribed and this note may contain transcription errors which may not have been corrected upon publication of note.**

## 2020-08-14 ENCOUNTER — Emergency Department (HOSPITAL_COMMUNITY): Payer: 59

## 2020-08-14 ENCOUNTER — Encounter (HOSPITAL_COMMUNITY): Payer: Self-pay

## 2020-08-14 ENCOUNTER — Observation Stay (HOSPITAL_COMMUNITY): Payer: 59

## 2020-08-14 ENCOUNTER — Inpatient Hospital Stay (HOSPITAL_COMMUNITY)
Admission: EM | Admit: 2020-08-14 | Discharge: 2020-08-21 | DRG: 871 | Disposition: A | Discharge status: Home / Self Care | Payer: 59 | Attending: Internal Medicine | Admitting: Internal Medicine

## 2020-08-14 DIAGNOSIS — Z930 Tracheostomy status: Secondary | ICD-10-CM

## 2020-08-14 DIAGNOSIS — Z931 Gastrostomy status: Secondary | ICD-10-CM

## 2020-08-14 DIAGNOSIS — I2510 Atherosclerotic heart disease of native coronary artery without angina pectoris: Secondary | ICD-10-CM | POA: Diagnosis present

## 2020-08-14 DIAGNOSIS — J9600 Acute respiratory failure, unspecified whether with hypoxia or hypercapnia: Secondary | ICD-10-CM | POA: Diagnosis present

## 2020-08-14 DIAGNOSIS — E43 Unspecified severe protein-calorie malnutrition: Secondary | ICD-10-CM | POA: Diagnosis present

## 2020-08-14 DIAGNOSIS — E876 Hypokalemia: Secondary | ICD-10-CM | POA: Diagnosis present

## 2020-08-14 DIAGNOSIS — J15211 Pneumonia due to Methicillin susceptible Staphylococcus aureus: Secondary | ICD-10-CM | POA: Diagnosis present

## 2020-08-14 DIAGNOSIS — Z79899 Other long term (current) drug therapy: Secondary | ICD-10-CM

## 2020-08-14 DIAGNOSIS — A419 Sepsis, unspecified organism: Secondary | ICD-10-CM | POA: Diagnosis not present

## 2020-08-14 DIAGNOSIS — F17210 Nicotine dependence, cigarettes, uncomplicated: Secondary | ICD-10-CM | POA: Diagnosis present

## 2020-08-14 DIAGNOSIS — J189 Pneumonia, unspecified organism: Secondary | ICD-10-CM | POA: Diagnosis not present

## 2020-08-14 DIAGNOSIS — R7989 Other specified abnormal findings of blood chemistry: Secondary | ICD-10-CM | POA: Diagnosis present

## 2020-08-14 DIAGNOSIS — A4101 Sepsis due to Methicillin susceptible Staphylococcus aureus: Secondary | ICD-10-CM | POA: Diagnosis not present

## 2020-08-14 DIAGNOSIS — J9601 Acute respiratory failure with hypoxia: Secondary | ICD-10-CM | POA: Diagnosis present

## 2020-08-14 DIAGNOSIS — J151 Pneumonia due to Pseudomonas: Secondary | ICD-10-CM | POA: Diagnosis present

## 2020-08-14 DIAGNOSIS — C329 Malignant neoplasm of larynx, unspecified: Secondary | ICD-10-CM | POA: Diagnosis present

## 2020-08-14 DIAGNOSIS — Z9221 Personal history of antineoplastic chemotherapy: Secondary | ICD-10-CM

## 2020-08-14 DIAGNOSIS — R1310 Dysphagia, unspecified: Secondary | ICD-10-CM | POA: Diagnosis present

## 2020-08-14 DIAGNOSIS — Z681 Body mass index (BMI) 19 or less, adult: Secondary | ICD-10-CM

## 2020-08-14 DIAGNOSIS — Z9119 Patient's noncompliance with other medical treatment and regimen: Secondary | ICD-10-CM

## 2020-08-14 DIAGNOSIS — D630 Anemia in neoplastic disease: Secondary | ICD-10-CM | POA: Diagnosis present

## 2020-08-14 DIAGNOSIS — Z20822 Contact with and (suspected) exposure to covid-19: Secondary | ICD-10-CM | POA: Diagnosis present

## 2020-08-14 DIAGNOSIS — Z923 Personal history of irradiation: Secondary | ICD-10-CM

## 2020-08-14 DIAGNOSIS — R945 Abnormal results of liver function studies: Secondary | ICD-10-CM

## 2020-08-14 DIAGNOSIS — Z8521 Personal history of malignant neoplasm of larynx: Secondary | ICD-10-CM

## 2020-08-14 LAB — COMPREHENSIVE METABOLIC PANEL
ALT: 158 U/L — ABNORMAL HIGH (ref 0–44)
AST: 52 U/L — ABNORMAL HIGH (ref 15–41)
Albumin: 2.3 g/dL — ABNORMAL LOW (ref 3.5–5.0)
Alkaline Phosphatase: 295 U/L — ABNORMAL HIGH (ref 38–126)
Anion gap: 15 (ref 5–15)
BUN: 53 mg/dL — ABNORMAL HIGH (ref 6–20)
CO2: 28 mmol/L (ref 22–32)
Calcium: 9.1 mg/dL (ref 8.9–10.3)
Chloride: 94 mmol/L — ABNORMAL LOW (ref 98–111)
Creatinine, Ser: 1.03 mg/dL (ref 0.61–1.24)
GFR, Estimated: >60 mL/min (ref >60)
Glucose, Bld: 101 mg/dL — ABNORMAL HIGH (ref 70–99)
Potassium: 4.3 mmol/L (ref 3.5–5.1)
Sodium: 137 mmol/L (ref 135–145)
Total Bilirubin: 2.6 mg/dL — ABNORMAL HIGH (ref 0.3–1.2)
Total Protein: 6.6 g/dL (ref 6.5–8.1)

## 2020-08-14 LAB — CBC WITH DIFFERENTIAL/PLATELET
Abs Immature Granulocytes: 0.28 10*3/uL — ABNORMAL HIGH (ref 0.00–0.07)
Basophils Absolute: 0.1 10*3/uL (ref 0.0–0.1)
Basophils Relative: 0 %
Eosinophils Absolute: 0.1 10*3/uL (ref 0.0–0.5)
Eosinophils Relative: 1 %
HCT: 34.9 % — ABNORMAL LOW (ref 39.0–52.0)
Hemoglobin: 11.2 g/dL — ABNORMAL LOW (ref 13.0–17.0)
Immature Granulocytes: 1 %
Lymphocytes Relative: 1 %
Lymphs Abs: 0.2 10*3/uL — ABNORMAL LOW (ref 0.7–4.0)
MCH: 30 pg (ref 26.0–34.0)
MCHC: 32.1 g/dL (ref 30.0–36.0)
MCV: 93.6 fL (ref 80.0–100.0)
Monocytes Absolute: 0.9 10*3/uL (ref 0.1–1.0)
Monocytes Relative: 5 %
Neutro Abs: 18.7 10*3/uL — ABNORMAL HIGH (ref 1.7–7.7)
Neutrophils Relative %: 92 %
Platelets: 498 10*3/uL — ABNORMAL HIGH (ref 150–400)
RBC: 3.73 MIL/uL — ABNORMAL LOW (ref 4.22–5.81)
RDW: 15.5 % (ref 11.5–15.5)
WBC: 20.3 10*3/uL — ABNORMAL HIGH (ref 4.0–10.5)
nRBC: 0 % (ref 0.0–0.2)

## 2020-08-14 LAB — HEPATIC FUNCTION PANEL
ALT: 124 U/L — ABNORMAL HIGH (ref 0–44)
AST: 40 U/L (ref 15–41)
Albumin: 2.3 g/dL — ABNORMAL LOW (ref 3.5–5.0)
Alkaline Phosphatase: 233 U/L — ABNORMAL HIGH (ref 38–126)
Bilirubin, Direct: 1.3 mg/dL — ABNORMAL HIGH (ref 0.0–0.2)
Indirect Bilirubin: 0.8 mg/dL (ref 0.3–0.9)
Total Bilirubin: 2.1 mg/dL — ABNORMAL HIGH (ref 0.3–1.2)
Total Protein: 6.8 g/dL (ref 6.5–8.1)

## 2020-08-14 LAB — GLUCOSE, CAPILLARY: Glucose-Capillary: 156 mg/dL — ABNORMAL HIGH (ref 70–99)

## 2020-08-14 LAB — RESP PANEL BY RT-PCR (FLU A&B, COVID) ARPGX2
Influenza A by PCR: NEGATIVE
Influenza B by PCR: NEGATIVE
SARS Coronavirus 2 by RT PCR: NEGATIVE

## 2020-08-14 LAB — PROTIME-INR
INR: 1.2 (ref 0.8–1.2)
Prothrombin Time: 15.2 seconds (ref 11.4–15.2)

## 2020-08-14 LAB — LACTIC ACID, PLASMA: Lactic Acid, Venous: 1.8 mmol/L (ref 0.5–1.9)

## 2020-08-14 LAB — APTT: aPTT: 28 seconds (ref 24–36)

## 2020-08-14 LAB — TROPONIN I (HIGH SENSITIVITY): Troponin I (High Sensitivity): 17 ng/L (ref <18)

## 2020-08-14 MED ORDER — OSMOLITE 1.5 CAL PO LIQD
237.0000 mL | Freq: Every day | ORAL | Status: DC
  Administered 2020-08-14 – 2020-08-20 (×24): 237 mL
  Filled 2020-08-14 (×37): qty 237

## 2020-08-14 MED ORDER — LACTATED RINGERS IV BOLUS: 500.0000 mL | Freq: Once | INTRAVENOUS | Status: DC

## 2020-08-14 MED ORDER — SODIUM CHLORIDE 0.9 % IV SOLN
500.0000 mg | INTRAVENOUS | Status: DC
  Administered 2020-08-14 – 2020-08-16 (×3): 500 mg via INTRAVENOUS
  Filled 2020-08-14 (×3): qty 500

## 2020-08-14 MED ORDER — CHLORHEXIDINE GLUCONATE CLOTH 2 % EX PADS
6.0000 | MEDICATED_PAD | Freq: Every day | CUTANEOUS | Status: DC
  Administered 2020-08-15 – 2020-08-21 (×6): 6 via TOPICAL

## 2020-08-14 MED ORDER — SODIUM CHLORIDE 0.9% FLUSH
10.0000 mL | Freq: Two times a day (BID) | INTRAVENOUS | Status: DC
Start: 2020-08-15 — End: 2020-08-21

## 2020-08-14 MED ORDER — LACTATED RINGERS IV SOLN: INTRAVENOUS | Status: AC

## 2020-08-14 MED ORDER — LACTATED RINGERS IV BOLUS
500.0000 mL | Freq: Once | INTRAVENOUS | Status: AC
  Administered 2020-08-14: 500 mL via INTRAVENOUS

## 2020-08-14 MED ORDER — ACETAMINOPHEN 650 MG RE SUPP
650.0000 mg | Freq: Four times a day (QID) | RECTAL | Status: DC | PRN
  Administered 2020-08-14: 650 mg via RECTAL
  Filled 2020-08-14: qty 1

## 2020-08-14 MED ORDER — LACTATED RINGERS IV BOLUS
30.0000 mL/kg | Freq: Once | INTRAVENOUS | Status: AC
  Administered 2020-08-14: 1632 mL via INTRAVENOUS

## 2020-08-14 MED ORDER — ACETAMINOPHEN 325 MG PO TABS
650.0000 mg | ORAL_TABLET | Freq: Once | ORAL | Status: AC
  Administered 2020-08-14: 650 mg via ORAL
  Filled 2020-08-14: qty 2

## 2020-08-14 MED ORDER — SODIUM CHLORIDE 0.9 % IV SOLN
2.0000 g | INTRAVENOUS | Status: DC
  Administered 2020-08-14 – 2020-08-16 (×3): 2 g via INTRAVENOUS
  Filled 2020-08-14 (×2): qty 2
  Filled 2020-08-14: qty 20

## 2020-08-14 MED ORDER — LACTATED RINGERS IV SOLN: INTRAVENOUS | Status: DC

## 2020-08-14 MED ORDER — ACETAMINOPHEN 325 MG PO TABS
650.0000 mg | ORAL_TABLET | Freq: Four times a day (QID) | ORAL | Status: DC | PRN
  Administered 2020-08-15 – 2020-08-16 (×3): 650 mg via ORAL
  Filled 2020-08-14 (×4): qty 2

## 2020-08-14 MED ORDER — ENOXAPARIN SODIUM 40 MG/0.4ML IJ SOSY
40.0000 mg | PREFILLED_SYRINGE | INTRAMUSCULAR | Status: DC
  Administered 2020-08-14 – 2020-08-20 (×7): 40 mg via SUBCUTANEOUS
  Filled 2020-08-14 (×7): qty 0.4

## 2020-08-14 MED ORDER — SODIUM CHLORIDE 0.9% FLUSH: 10.0000 mL | INTRAVENOUS | Status: DC | PRN

## 2020-08-14 NOTE — H&P (Signed)
History and Physical    Erik [name] UEK:[id number] DOB: March 23, 1965 DOA: 08/14/2020  PCP: [name], NP  Patient coming from: Home.  Chief Complaint: Weakness shortness of breath cough.  HPI: Erik [name] is a 55 y.o. male with history of laryngeal carcinoma status post chemoradiation has not been feeling well for over a week.  Has been having increasing cough shortness of breath and weakness.  Patient had followed up with his ENT surgeon Dr. Redmond Baseman on 1 July when patient states he was found to have some mild swelling around the larynx.  But no signs of any recurrence of the cancer.  Subsequent which he had followed up with his oncologist on July 5 when patient was prescribed a course of prednisone for laryngeal swelling.  He took it for 5 days.  Over the last few days he has been getting more short of breath and cough subjective feeling of fever chills he decided come to the ER.  ED Course: In the ER patient had a temperature of 101.1 with a heart rate in the 140s blood pressure 95 systolic WBC count of 15.0 lactic acid was normal chest x-ray shows right upper lobe infiltrates.  Patient was started on antibiotics fluids for sepsis secondary to pneumonia.  COVID test was negative.  Review of Systems: As per HPI, rest all negative.   Past Medical History:  Diagnosis Date   Coronary artery disease    Dyspnea    GERD (gastroesophageal reflux disease)    Phreesia 02/21/2020   Ruptured lumbar intervertebral disc     Past Surgical History:  Procedure Laterality Date   CARDIAC CATHETERIZATION     with stent placement   IR GASTROSTOMY TUBE MOD SED  04/20/2020   IR IMAGING GUIDED PORT INSERTION  04/20/2020   TONSILLECTOMY     TRACHEOSTOMY TUBE PLACEMENT N/A 03/05/2020   Procedure: AWAKE TRACHEOSTOMY, DIRECT LARYNGOSCOPY WITH BIOPSY;  Surgeon: Melida Quitter, MD;  Location: [facility];  Service: ENT;  Laterality: N/A;     reports that he has been smoking cigarettes. He has quit using smokeless  tobacco.  His smokeless tobacco use included chew. He reports that he does not drink alcohol and does not use drugs.  No Known Allergies  History reviewed. No pertinent family history.  Prior to Admission medications   Medication Sig Start Date End Date Taking? Authorizing Provider  doxycycline (VIBRA-TABS) 100 MG tablet Take 1 tablet (100 mg total) by mouth 2 (two) times daily. Patient not taking: Reported on 08/14/2020 05/26/20   Benay Pike, MD  HYDROcodone-acetaminophen (HYCET) 7.5-325 mg/15 ml solution Take 10 mLs by mouth every 8 (eight) hours as needed for moderate pain or severe pain (on concurrent chemoradiation). Patient not taking: Reported on 08/14/2020 06/09/20   Benay Pike, MD  lidocaine (XYLOCAINE) 2 % solution Patient: Mix 1part 2% viscous lidocaine, 1part H20. Swallow 50mL of diluted mixture, 71min before meals and at bedtime, up to QID 04/26/20   Eppie Gibson, MD  lidocaine-prilocaine (EMLA) cream Apply to affected area once Patient not taking: Reported on 08/14/2020 04/13/20   Benay Pike, MD  methylPREDNISolone (MEDROL DOSEPAK) 4 MG TBPK tablet Take by mouth as directed. 6 tablets on first day followed by 5 on day 2 , 4 on day 3, 3 on day 4, 2 on day 5 and 1 on day 6 Patient not taking: Reported on 08/14/2020 08/03/20   Benay Pike, MD  Nutritional Supplements (FEEDING SUPPLEMENT, OSMOLITE 1.5 CAL,) LIQD Give 1 1/2 cartons of tube  feeding at 8am, noon, 4pm and 8pm via tube.  Flush with 86ml of water before and after each feeding.  Must drink or give via tube 16oz bottle water for additional hydration. Patient not taking: Reported on 08/14/2020 04/28/20   Eppie Gibson, MD  ondansetron (ZOFRAN) 8 MG tablet Take 1 tablet (8 mg total) by mouth 2 (two) times daily as needed. Start on the third day after cisplatin chemotherapy. Patient not taking: Reported on 08/14/2020 04/13/20   Benay Pike, MD  prochlorperazine (COMPAZINE) 10 MG tablet Take 1 tablet (10 mg total) by  mouth every 6 (six) hours as needed (Nausea or vomiting). Patient not taking: Reported on 08/14/2020 04/13/20   Benay Pike, MD    Physical Exam: Constitutional: Moderately built and nourished. Vitals:   08/14/20 1300 08/14/20 1313 08/14/20 1315 08/14/20 1400  BP: 102/66  103/69 101/74  Pulse: (!) 125  (!) 124 (!) 113  Resp: 15  (!) 22 15  Temp:  98.8 F (37.1 C)    TempSrc:  Oral    SpO2: 96%  92% 96%  Weight:      Height:       Eyes: Anicteric no pallor. ENMT: No discharge from the ears eyes nose and mouth. Neck: Tracheostomy seen. Respiratory: No rhonchi or crepitations. Cardiovascular: S1-S2 heard. Abdomen: Soft nontender bowel sounds present. Musculoskeletal: No edema. Skin: No rash. Neurologic: Alert awake oriented to time place and person.  Moves all extremities. Psychiatric: Appears normal.  Normal affect.   Labs on Admission: I have personally reviewed following labs and imaging studies  CBC: Recent Labs  Lab 08/14/20 1206  WBC 20.3*  NEUTROABS 18.7*  HGB 11.2*  HCT 34.9*  MCV 93.6  PLT 885*   Basic Metabolic Panel: Recent Labs  Lab 08/14/20 1206  NA 137  K 4.3  CL 94*  CO2 28  GLUCOSE 101*  BUN 53*  CREATININE 1.03  CALCIUM 9.1   GFR: Estimated Creatinine Clearance: 62.4 mL/min (by C-G formula based on SCr of 1.03 mg/dL). Liver Function Tests: Recent Labs  Lab 08/14/20 1206  AST 52*  ALT 158*  ALKPHOS 295*  BILITOT 2.6*  PROT 6.6  ALBUMIN 2.3*   No results for input(s): LIPASE, AMYLASE in the last 168 hours. No results for input(s): AMMONIA in the last 168 hours. Coagulation Profile: Recent Labs  Lab 08/14/20 1206  INR 1.2   Cardiac Enzymes: No results for input(s): CKTOTAL, CKMB, CKMBINDEX, TROPONINI in the last 168 hours. BNP (last 3 results) No results for input(s): PROBNP in the last 8760 hours. HbA1C: No results for input(s): HGBA1C in the last 72 hours. CBG: No results for input(s): GLUCAP in the last 168  hours. Lipid Profile: No results for input(s): CHOL, HDL, LDLCALC, TRIG, CHOLHDL, LDLDIRECT in the last 72 hours. Thyroid Function Tests: No results for input(s): TSH, T4TOTAL, FREET4, T3FREE, THYROIDAB in the last 72 hours. Anemia Panel: No results for input(s): VITAMINB12, FOLATE, FERRITIN, TIBC, IRON, RETICCTPCT in the last 72 hours. Urine analysis: No results found for: COLORURINE, APPEARANCEUR, LABSPEC, PHURINE, GLUCOSEU, HGBUR, BILIRUBINUR, KETONESUR, PROTEINUR, UROBILINOGEN, NITRITE, LEUKOCYTESUR Sepsis Labs: @LABRCNTIP (procalcitonin:4,lacticidven:4) ) Recent Results (from the past 240 hour(s))  Resp Panel by RT-PCR (Flu A&B, Covid) Nasopharyngeal Swab     Status: None   Collection Time: 08/14/20  2:23 PM   Specimen: Nasopharyngeal Swab; Nasopharyngeal(NP) swabs in vial transport medium  Result Value Ref Range Status   SARS Coronavirus 2 by RT PCR NEGATIVE NEGATIVE Final    Comment: (NOTE) SARS-CoV-2  target nucleic acids are NOT DETECTED.  The SARS-CoV-2 RNA is generally detectable in upper respiratory specimens during the acute phase of infection. The lowest concentration of SARS-CoV-2 viral copies this assay can detect is 138 copies/mL. A negative result does not preclude SARS-Cov-2 infection and should not be used as the sole basis for treatment or other patient management decisions. A negative result may occur with  improper specimen collection/handling, submission of specimen other than nasopharyngeal swab, presence of viral mutation(s) within the areas targeted by this assay, and inadequate number of viral copies(<138 copies/mL). A negative result must be combined with clinical observations, patient history, and epidemiological information. The expected result is Negative.  Fact Sheet for Patients:  EntrepreneurPulse.com.au  Fact Sheet for Healthcare Providers:  IncredibleEmployment.be  This test is no t yet approved or cleared  by the [place] FDA and  has been authorized for detection and/or diagnosis of SARS-CoV-2 by FDA under an Emergency Use Authorization (EUA). This EUA will remain  in effect (meaning this test can be used) for the duration of the COVID-19 declaration under Section 564(b)(1) of the Act, 21 U.S.C.section 360bbb-3(b)(1), unless the authorization is terminated  or revoked sooner.       Influenza A by PCR NEGATIVE NEGATIVE Final   Influenza B by PCR NEGATIVE NEGATIVE Final    Comment: (NOTE) The Xpert Xpress SARS-CoV-2/FLU/RSV plus assay is intended as an aid in the diagnosis of influenza from Nasopharyngeal swab specimens and should not be used as a sole basis for treatment. Nasal washings and aspirates are unacceptable for Xpert Xpress SARS-CoV-2/FLU/RSV testing.  Fact Sheet for Patients: EntrepreneurPulse.com.au  Fact Sheet for Healthcare Providers: IncredibleEmployment.be  This test is not yet approved or cleared by the [place] FDA and has been authorized for detection and/or diagnosis of SARS-CoV-2 by FDA under an Emergency Use Authorization (EUA). This EUA will remain in effect (meaning this test can be used) for the duration of the COVID-19 declaration under Section 564(b)(1) of the Act, 21 U.S.C. section 360bbb-3(b)(1), unless the authorization is terminated or revoked.  Performed at [facility], [place] [address], [place], [place] [address]      Radiological Exams on Admission: DG Chest Port 1 View  Result Date: 08/14/2020 CLINICAL DATA:  Possible sepsis 8 a EXAM: PORTABLE CHEST 1 VIEW COMPARISON:  Prior chest x-ray 03/04/2020 FINDINGS: Marked new patchy airspace opacity in the right upper lobe. A right IJ single-lumen power injectable port catheter in place. The tip of the catheter overlies the distal SVC. Cardiac and mediastinal contours are unchanged. Tracheostomy tube present. Tip midline and at the level  of the clavicles. Atherosclerotic calcification again noted in the transverse aorta. No acute osseous abnormality. IMPRESSION: Right upper lobe pneumonia. Electronically Signed   By: Jacqulynn Cadet M.D.   On: 08/14/2020 12:26    EKG: Independently reviewed.  Sinus tachycardia.  Assessment/Plan Principal Problem:   Sepsis ([place]) Active Problems:   Laryngeal cancer ([place])   G tube feedings ([place])   CAP (community acquired pneumonia)    Sepsis likely from pneumonia for which patient is on ceftriaxone and Zithromax we will follow cultures.  Continue hydration. Elevated LFTs which appears to be new.  We will check direct and indirect bilirubin also check a sonogram of the abdomen.  Follow LFTs.  Check acute hepatitis panel. Anemia appears to be chronic likely related to patient's chronic disease.  Follow CBC. G-tube feeds for which I discussed with pharmacy to continue home Osmolite. Tracheostomy.  DVT prophylaxis: Lovenox. Code Status: Full code. Family Communication: Patient's wife at the bedside. Disposition Plan: Home when stable. Consults called: None. Admission status: Observation.   Rise Patience MD [facility] Pager [phone].  If 7PM-7AM, please contact night-coverage [email or web address] Password [facility]  08/14/2020, 3:43 PM

## 2020-08-14 NOTE — Progress Notes (Signed)
   08/14/20 2124  Assess: MEWS Score  Temp (!) 103.1 F (39.5 C)  BP 112/65  Pulse Rate (!) 128  Resp 20  SpO2 92 %  O2 Device Tracheostomy Collar  FiO2 (%) 28 %  Assess: MEWS Score  MEWS Temp 2  MEWS Systolic 0  MEWS Pulse 2  MEWS RR 0  MEWS LOC 0  MEWS Score 4  MEWS Score Color Red  Assess: if the MEWS score is Yellow or Red  Were vital signs taken at a resting state? Yes  Focused Assessment No change from prior assessment  Does the patient meet 2 or more of the SIRS criteria? Yes  Does the patient have a confirmed or suspected source of infection? Yes  Provider and Rapid Response Notified? Yes  MEWS guidelines implemented *See Row Information* No, previously red, continue vital signs every 4 hours  Treat  MEWS Interventions Administered scheduled meds/treatments;Administered prn meds/treatments;Escalated (See documentation below)  Pain Scale 0-10  Pain Score 0  Take Vital Signs  Increase Vital Sign Frequency  Red: Q 1hr X 4 then Q 4hr X 4, if remains red, continue Q 4hrs  Escalate  MEWS: Escalate Red: discuss with charge nurse/RN and provider, consider discussing with RRT  Notify: Provider  Provider Name/Title E. Stark Klein, NP  Date Provider Notified 08/14/20  Time Provider Notified 2150  Notification Type Page  Notification Reason Change in status  Provider response At bedside;See new orders  Date of Provider Response 08/14/20  Time of Provider Response 2240  Notify: Rapid Response  Name of Rapid Response RN Notified Janell, RN  Date Rapid Response Notified 08/14/20  Time Rapid Response Notified 2145  Document  Progress note created (see row info) Yes  Assess: SIRS CRITERIA  SIRS Temperature  1  SIRS Pulse 1  SIRS Respirations  0  SIRS WBC 0  SIRS Score Sum  2

## 2020-08-14 NOTE — ED Provider Notes (Signed)
[facility] DEPT Provider Note   CSN: [id number] Arrival date & time: 08/14/20  1108     History Chief Complaint  Patient presents with   Weakness    Erik [name] is a 55 y.o. 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  Patient presents emergency department with a chief complaint of shortness of breath and lightheadedness.  Patient reports that he has had shortness of breath over the last week.  Shortness of breath has been constant over this time.  No alleviating or aggravating factors.  Patient endorses productive cough at baseline.  No change in cough over the last week.  Cough is producing clearly yellow mucus.  Endorses 1 episode of hemoptysis last week and generalized fatigue x1 week.  Patient states that he started becoming lightheaded with standing earlier today.  Patient denies any syncopal episodes, numbness, weakness, headache, visual disturbance, chest pain, palpitations, leg swelling.    Patient denies any known sick contacts.  Patient has been vaccinated for COVID-19 however has not received booster.   Weakness Associated symptoms: cough and shortness of breath   Associated symptoms: no abdominal pain, no chest pain, no diarrhea, no dizziness, no dysuria, no fever, no frequency, no headaches, no nausea, no seizures and no vomiting       Past Medical History:  Diagnosis Date   Coronary artery disease    Dyspnea    GERD (gastroesophageal reflux disease)    [employer] 02/21/2020   Ruptured lumbar intervertebral disc     Patient Active Problem List   Diagnosis Date Noted   Port-A-Cath in place 08/03/2020   Mucositis due to antineoplastic therapy 05/06/2020   Chemotherapy-induced nausea 04/28/2020   Weight loss, unintentional 04/28/2020   Dysgeusia 04/28/2020   G tube feedings ([place]) 04/28/2020   Nicotine abuse 04/21/2020   Head and  neck cancer ([place])    History of tracheostomy 03/16/2020   Malnutrition of moderate degree 03/05/2020   Laryngeal cancer ([place]) 03/04/2020    Past Surgical History:  Procedure Laterality Date   CARDIAC CATHETERIZATION     with stent placement   IR GASTROSTOMY TUBE MOD SED  04/20/2020   IR IMAGING GUIDED PORT INSERTION  04/20/2020   TONSILLECTOMY     TRACHEOSTOMY TUBE PLACEMENT N/A 03/05/2020   Procedure: AWAKE TRACHEOSTOMY, DIRECT LARYNGOSCOPY WITH BIOPSY;  Surgeon: Melida Quitter, MD;  Location: [facility];  Service: ENT;  Laterality: N/A;       History reviewed. No pertinent family history.  Social History   Tobacco Use   Smoking status: Every Day    Types: Cigarettes   Smokeless tobacco: Former    Types: Nurse, children's Use: Former  Substance Use Topics   Alcohol use: No   Drug use: No    Home Medications Prior to Admission medications   Medication Sig Start Date End Date Taking? Authorizing Provider  doxycycline (VIBRA-TABS) 100 MG tablet Take 1 tablet (100 mg total) by mouth 2 (two) times daily. 05/26/20   Benay Pike, MD  HYDROcodone-acetaminophen (HYCET) 7.5-325 mg/15 ml solution Take 10 mLs by mouth every 8 (eight) hours as needed for moderate pain or severe pain (on concurrent chemoradiation). 06/09/20   Benay Pike, MD  ibuprofen (ADVIL,MOTRIN) 200 MG tablet Take 800 mg by mouth every 8 (eight) hours as needed for mild pain. For pain Patient not taking: Reported on 08/03/2020    [provider]  lidocaine (XYLOCAINE)  2 % solution Patient: Mix 1part 2% viscous lidocaine, 1part H20. Swallow 34mL of diluted mixture, 76min before meals and at bedtime, up to QID 04/26/20   Eppie Gibson, MD  lidocaine-prilocaine (EMLA) cream Apply to affected area once 04/13/20   Benay Pike, MD  methylPREDNISolone (MEDROL DOSEPAK) 4 MG TBPK tablet Take by mouth as directed. 6 tablets on first day followed by 5 on day 2 , 4 on day 3, 3 on day 4, 2 on day 5 and 1 on day 6  08/03/20   Benay Pike, MD  Multiple Vitamin (MULTIVITAMIN WITH MINERALS) TABS tablet Take 1 tablet by mouth daily.    [provider]  Nutritional Supplements (FEEDING SUPPLEMENT, OSMOLITE 1.5 CAL,) LIQD Give 1 1/2 cartons of tube feeding at 8am, noon, 4pm and 8pm via tube.  Flush with 98ml of water before and after each feeding.  Must drink or give via tube 16oz bottle water for additional hydration. 04/28/20   Eppie Gibson, MD  ondansetron (ZOFRAN) 8 MG tablet Take 1 tablet (8 mg total) by mouth 2 (two) times daily as needed. Start on the third day after cisplatin chemotherapy. 04/13/20   Benay Pike, MD  prochlorperazine (COMPAZINE) 10 MG tablet Take 1 tablet (10 mg total) by mouth every 6 (six) hours as needed (Nausea or vomiting). 04/13/20   Benay Pike, MD    Allergies    Patient has no known allergies.  Review of Systems   Review of Systems  Constitutional:  Positive for fatigue. Negative for chills and fever.  Eyes:  Negative for visual disturbance.  Respiratory:  Positive for cough and shortness of breath.   Cardiovascular:  Negative for chest pain, palpitations and leg swelling.  Gastrointestinal:  Negative for abdominal distention, abdominal pain, constipation, diarrhea, nausea and vomiting.  Genitourinary:  Negative for difficulty urinating, dysuria, frequency and hematuria.  Musculoskeletal:  Negative for back pain and neck pain.  Skin:  Negative for color change and rash.  Neurological:  Positive for weakness and light-headedness. Negative for dizziness, tremors, seizures, syncope, facial asymmetry, speech difficulty, numbness and headaches.  Psychiatric/Behavioral:  Negative for confusion.    Physical Exam Updated Vital Signs BP 114/68 (BP Location: Left Arm)   Pulse (!) 140   Temp (!) 101.1 F (38.4 C) (Oral)   Resp (!) 28   SpO2 (!) 89%   Physical Exam Vitals and nursing note reviewed.  Constitutional:      General: He is not in acute distress.     Appearance: He is ill-appearing. He is not toxic-appearing or diaphoretic.  Eyes:     General: No scleral icterus.       Right eye: No discharge.        Left eye: No discharge.     Extraocular Movements: Extraocular movements intact.     Conjunctiva/sclera: Conjunctivae normal.     Pupils: Pupils are equal, round, and reactive to light.  Neck:     Trachea: Trachea normal.     Comments: Patient noted to have yellow mucus coming from trach with coughing, suctioned by respiratory care at bedside. Cardiovascular:     Rate and Rhythm: Tachycardia present.     Pulses:          Radial pulses are 2+ on the right side and 2+ on the left side.     Heart sounds: Normal heart sounds.     Comments: Tachycardic at rate of 140 Pulmonary:     Effort: Pulmonary effort is normal. Tachypnea present. No  respiratory distress.     Breath sounds: Examination of the right-upper field reveals rhonchi. Examination of the left-upper field reveals rhonchi. Examination of the right-middle field reveals rhonchi. Examination of the left-middle field reveals rhonchi. Examination of the right-lower field reveals rhonchi. Examination of the left-lower field reveals rhonchi. Rhonchi present.     Comments: Rhonchi noted to all lung fields Abdominal:     General: Abdomen is flat. There is no distension. There are no signs of injury.     Palpations: Abdomen is soft. There is no mass or pulsatile mass.     Tenderness: There is no abdominal tenderness. There is no guarding or rebound.     Hernia: There is no hernia in the umbilical area or ventral area.     Comments: G-tube to left upper quadrant, no surrounding erythema.    Musculoskeletal:     Cervical back: Normal range of motion and neck supple. No rigidity.     Comments: No swelling, tenderness, or edema to bilateral lower extremities.  No wounds, erythema, or rash noted to bilateral feet.  Skin:    General: Skin is warm and dry.     Coloration: Skin is not jaundiced  or pale.  Neurological:     General: No focal deficit present.     Mental Status: He is alert.     GCS: GCS eye subscore is 4. GCS verbal subscore is 5. GCS motor subscore is 6.     Cranial Nerves: No cranial nerve deficit or facial asymmetry.     Sensory: Sensation is intact.     Motor: No weakness, tremor or seizure activity.     Coordination: Finger-Nose-Finger Test normal.     Comments: CN II-XII intact; performed in supine position, grip strength equal, +5 strength to bilateral upper extremities, +5 strength to dorsiflexion and plantarflexion, patient able to left both legs against gravity and hold each there without difficulty, sensation to light touch intact to bilateral upper and lower extremities  Psychiatric:        Behavior: Behavior is cooperative.    ED Results / Procedures / Treatments   Labs (all labs ordered are listed, but only abnormal results are displayed) Labs Reviewed  COMPREHENSIVE METABOLIC PANEL - Abnormal; Notable for the following components:      Result Value   Chloride 94 (*)    Glucose, Bld 101 (*)    BUN 53 (*)    Albumin 2.3 (*)    AST 52 (*)    ALT 158 (*)    Alkaline Phosphatase 295 (*)    Total Bilirubin 2.6 (*)    All other components within normal limits  CBC WITH DIFFERENTIAL/PLATELET - Abnormal; Notable for the following components:   WBC 20.3 (*)    RBC 3.73 (*)    Hemoglobin 11.2 (*)    HCT 34.9 (*)    Platelets 498 (*)    Neutro Abs 18.7 (*)    Lymphs Abs 0.2 (*)    Abs Immature Granulocytes 0.28 (*)    All other components within normal limits  CULTURE, BLOOD (SINGLE)  URINE CULTURE  CULTURE, BLOOD (SINGLE)  LACTIC ACID, PLASMA  PROTIME-INR  APTT  URINALYSIS, ROUTINE W REFLEX MICROSCOPIC  TROPONIN I (HIGH SENSITIVITY)    EKG EKG Interpretation  Date/Time:  Saturday August 14 2020 12:15:55 EDT Ventricular Rate:  134 PR Interval:  108 QRS Duration: 85 QT Interval:  297 QTC Calculation: 444 R Axis:   95 Text  Interpretation: Sinus tachycardia Borderline  right axis deviation Borderline T abnormalities, inferior leads Confirmed by Lacretia Leigh ([id number]) on 08/14/2020 12:28:04 PM  Radiology DG Chest Port 1 View  Result Date: 08/14/2020 CLINICAL DATA:  Possible sepsis 8 a EXAM: PORTABLE CHEST 1 VIEW COMPARISON:  Prior chest x-ray 03/04/2020 FINDINGS: Marked new patchy airspace opacity in the right upper lobe. A right IJ single-lumen power injectable port catheter in place. The tip of the catheter overlies the distal SVC. Cardiac and mediastinal contours are unchanged. Tracheostomy tube present. Tip midline and at the level of the clavicles. Atherosclerotic calcification again noted in the transverse aorta. No acute osseous abnormality. IMPRESSION: Right upper lobe pneumonia. Electronically Signed   By: Jacqulynn Cadet M.D.   On: 08/14/2020 12:26    Procedures .Critical Care  Date/Time: 08/14/2020 1:34 PM Performed by: Loni Beckwith, PA-C Authorized by: Loni Beckwith, PA-C   Critical care provider statement:    Critical care time (minutes):  45   Critical care was necessary to treat or prevent imminent or life-threatening deterioration of the following conditions:  Sepsis   Critical care was time spent personally by me on the following activities:  Evaluation of patient's response to treatment, examination of patient, ordering and performing treatments and interventions, ordering and review of laboratory studies, ordering and review of radiographic studies, pulse oximetry, re-evaluation of patient's condition, obtaining history from patient or surrogate and review of old charts   Care discussed with: admitting provider     Medications Ordered in ED Medications  lactated ringers infusion ( Intravenous New Bag/Given 08/14/20 1310)  cefTRIAXone (ROCEPHIN) 2 g in sodium chloride 0.9 % 100 mL IVPB (2 g Intravenous New Bag/Given 08/14/20 1309)  azithromycin (ZITHROMAX) 500 mg in sodium chloride  0.9 % 250 mL IVPB (500 mg Intravenous New Bag/Given 08/14/20 1312)  acetaminophen (TYLENOL) tablet 650 mg (650 mg Oral Given 08/14/20 1216)    ED Course  I have reviewed the triage vital signs and the nursing notes.  Pertinent labs & imaging results that were available during my care of the patient were reviewed by me and considered in my medical decision making (see chart for details).    MDM Rules/Calculators/A&P                          Ill-appearing 55 year old male no acute distress, nontoxic-appearing.  Patient presents emergency department with a complaint of shortness of breath and lightheadedness when standing.  Shortness of breath has been present x1 week.  Lightheadedness when standing started earlier today.  Patient also endorses generalized fatigue, 1 episode of hemoptysis last week, and productive cough.  Patient reports productive cough at baseline, no change in sputum production or sputum color.  Patient noted to be febrile with temperature of 101.1 F, tachycardic at rate of 140, and tachypneic.  Patient completed chemotherapy and radiation for laryngeal cancer in May.  Recently had follow-up with oncology at the beginning of July.  Patient's vital signs concern for possible sepsis.  Will order ED sepsis labs at this time.    CBC shows leukocytosis at 20.3   Chest x-ray shows right upper lobe pneumonia. Sepsis activated, will start patient on ceftriaxone and azithromycin.  We will start patient on maintenance LR fluids.  CMP shows BUN elevated at 5038, likely secondary to dehydration. Troponin 17 Lactic acid 1.8  We will start patient on 30 mL/kilogram lactated ringer fluid bolus.  Will consult hospitalist for admission. Spoke to hospitalist Dr. Hal Hope who will  see the patient for admission.    Final Clinical Impression(s) / ED Diagnoses Final diagnoses:  Abnormal LFTs    Rx / DC Orders ED Discharge Orders     None        Dyann Ruddle 08/14/20 2142    Lacretia Leigh, MD 08/15/20 1424

## 2020-08-14 NOTE — Progress Notes (Signed)
Rt called re trach pt in ER.  Pt has Shiley 6 and does not use inner cannula.  Suctioned for thick tan sec. Pt has strong cough and is able to clear secretions well.  Placed on 28% ATC for O2 and Humidity.  Not sure at this time whether he will be admitted.

## 2020-08-14 NOTE — ED Notes (Signed)
Per Hospitalist, CBC and creatinine should not have been ordered for 1542.

## 2020-08-14 NOTE — ED Notes (Signed)
Pt cannot tolerate laying flat to obtain rectal temp.

## 2020-08-14 NOTE — ED Triage Notes (Signed)
Pt arrived via walk in, c/o increasing weakness and body aches x2 days, denies any known sick contacts. Trach in place, states lower o2 this morning 89%.

## 2020-08-15 ENCOUNTER — Other Ambulatory Visit: Payer: Self-pay

## 2020-08-15 DIAGNOSIS — Z9119 Patient's noncompliance with other medical treatment and regimen: Secondary | ICD-10-CM | POA: Diagnosis not present

## 2020-08-15 DIAGNOSIS — E43 Unspecified severe protein-calorie malnutrition: Secondary | ICD-10-CM | POA: Diagnosis not present

## 2020-08-15 DIAGNOSIS — J9601 Acute respiratory failure with hypoxia: Secondary | ICD-10-CM | POA: Diagnosis not present

## 2020-08-15 DIAGNOSIS — A4101 Sepsis due to Methicillin susceptible Staphylococcus aureus: Secondary | ICD-10-CM | POA: Diagnosis not present

## 2020-08-15 DIAGNOSIS — F17210 Nicotine dependence, cigarettes, uncomplicated: Secondary | ICD-10-CM | POA: Diagnosis present

## 2020-08-15 DIAGNOSIS — A419 Sepsis, unspecified organism: Secondary | ICD-10-CM | POA: Diagnosis not present

## 2020-08-15 DIAGNOSIS — Z681 Body mass index (BMI) 19 or less, adult: Secondary | ICD-10-CM | POA: Diagnosis not present

## 2020-08-15 DIAGNOSIS — E876 Hypokalemia: Secondary | ICD-10-CM | POA: Diagnosis not present

## 2020-08-15 DIAGNOSIS — J151 Pneumonia due to Pseudomonas: Secondary | ICD-10-CM | POA: Diagnosis not present

## 2020-08-15 DIAGNOSIS — R1310 Dysphagia, unspecified: Secondary | ICD-10-CM | POA: Diagnosis present

## 2020-08-15 DIAGNOSIS — Z9221 Personal history of antineoplastic chemotherapy: Secondary | ICD-10-CM | POA: Diagnosis not present

## 2020-08-15 DIAGNOSIS — J189 Pneumonia, unspecified organism: Secondary | ICD-10-CM | POA: Diagnosis not present

## 2020-08-15 DIAGNOSIS — C329 Malignant neoplasm of larynx, unspecified: Secondary | ICD-10-CM | POA: Diagnosis not present

## 2020-08-15 DIAGNOSIS — J15211 Pneumonia due to Methicillin susceptible Staphylococcus aureus: Secondary | ICD-10-CM | POA: Diagnosis not present

## 2020-08-15 DIAGNOSIS — Z931 Gastrostomy status: Secondary | ICD-10-CM | POA: Diagnosis not present

## 2020-08-15 DIAGNOSIS — R945 Abnormal results of liver function studies: Secondary | ICD-10-CM | POA: Diagnosis present

## 2020-08-15 DIAGNOSIS — D630 Anemia in neoplastic disease: Secondary | ICD-10-CM | POA: Diagnosis not present

## 2020-08-15 DIAGNOSIS — I2510 Atherosclerotic heart disease of native coronary artery without angina pectoris: Secondary | ICD-10-CM | POA: Diagnosis present

## 2020-08-15 DIAGNOSIS — Z930 Tracheostomy status: Secondary | ICD-10-CM | POA: Diagnosis not present

## 2020-08-15 DIAGNOSIS — R7989 Other specified abnormal findings of blood chemistry: Secondary | ICD-10-CM | POA: Diagnosis present

## 2020-08-15 DIAGNOSIS — Z923 Personal history of irradiation: Secondary | ICD-10-CM | POA: Diagnosis not present

## 2020-08-15 DIAGNOSIS — Z20822 Contact with and (suspected) exposure to covid-19: Secondary | ICD-10-CM | POA: Diagnosis not present

## 2020-08-15 DIAGNOSIS — Z8521 Personal history of malignant neoplasm of larynx: Secondary | ICD-10-CM | POA: Diagnosis not present

## 2020-08-15 DIAGNOSIS — Z79899 Other long term (current) drug therapy: Secondary | ICD-10-CM | POA: Diagnosis not present

## 2020-08-15 DIAGNOSIS — J9600 Acute respiratory failure, unspecified whether with hypoxia or hypercapnia: Secondary | ICD-10-CM | POA: Diagnosis present

## 2020-08-15 LAB — HEPATITIS PANEL, ACUTE
HCV Ab: NONREACTIVE
Hep A IgM: NONREACTIVE
Hep B C IgM: NONREACTIVE
Hepatitis B Surface Ag: NONREACTIVE

## 2020-08-15 LAB — HIV ANTIBODY (ROUTINE TESTING W REFLEX): HIV Screen 4th Generation wRfx: NONREACTIVE

## 2020-08-15 LAB — CBC WITH DIFFERENTIAL/PLATELET
Abs Immature Granulocytes: 0.05 10*3/uL (ref 0.00–0.07)
Basophils Absolute: 0 10*3/uL (ref 0.0–0.1)
Basophils Relative: 0 %
Eosinophils Absolute: 0 10*3/uL (ref 0.0–0.5)
Eosinophils Relative: 0 %
HCT: 26.6 % — ABNORMAL LOW (ref 39.0–52.0)
Hemoglobin: 8.4 g/dL — ABNORMAL LOW (ref 13.0–17.0)
Immature Granulocytes: 0 %
Lymphocytes Relative: 2 %
Lymphs Abs: 0.2 10*3/uL — ABNORMAL LOW (ref 0.7–4.0)
MCH: 29.8 pg (ref 26.0–34.0)
MCHC: 31.6 g/dL (ref 30.0–36.0)
MCV: 94.3 fL (ref 80.0–100.0)
Monocytes Absolute: 0.5 10*3/uL (ref 0.1–1.0)
Monocytes Relative: 4 %
Neutro Abs: 11.8 10*3/uL — ABNORMAL HIGH (ref 1.7–7.7)
Neutrophils Relative %: 94 %
Platelets: 393 10*3/uL (ref 150–400)
RBC: 2.82 MIL/uL — ABNORMAL LOW (ref 4.22–5.81)
RDW: 15.6 % — ABNORMAL HIGH (ref 11.5–15.5)
WBC: 12.5 10*3/uL — ABNORMAL HIGH (ref 4.0–10.5)
nRBC: 0 % (ref 0.0–0.2)

## 2020-08-15 LAB — COMPREHENSIVE METABOLIC PANEL
ALT: 91 U/L — ABNORMAL HIGH (ref 0–44)
AST: 50 U/L — ABNORMAL HIGH (ref 15–41)
Albumin: 1.9 g/dL — ABNORMAL LOW (ref 3.5–5.0)
Alkaline Phosphatase: 208 U/L — ABNORMAL HIGH (ref 38–126)
Anion gap: 10 (ref 5–15)
BUN: 40 mg/dL — ABNORMAL HIGH (ref 6–20)
CO2: 28 mmol/L (ref 22–32)
Calcium: 8.2 mg/dL — ABNORMAL LOW (ref 8.9–10.3)
Chloride: 99 mmol/L (ref 98–111)
Creatinine, Ser: 0.8 mg/dL (ref 0.61–1.24)
GFR, Estimated: >60 mL/min (ref >60)
Glucose, Bld: 93 mg/dL (ref 70–99)
Potassium: 3.3 mmol/L — ABNORMAL LOW (ref 3.5–5.1)
Sodium: 137 mmol/L (ref 135–145)
Total Bilirubin: 1.7 mg/dL — ABNORMAL HIGH (ref 0.3–1.2)
Total Protein: 5.4 g/dL — ABNORMAL LOW (ref 6.5–8.1)

## 2020-08-15 MED ORDER — POTASSIUM CHLORIDE 20 MEQ PO PACK
20.0000 meq | PACK | Freq: Two times a day (BID) | ORAL | Status: AC
  Administered 2020-08-15 (×2): 20 meq
  Filled 2020-08-15 (×2): qty 1

## 2020-08-15 NOTE — Progress Notes (Signed)
RT called to room 1431 for pt had removed his whole trach. Rt placed trach back in with no complications. RT educated pt on trach removal. Pt has good color change, stable vitals and equal bs. No distress or complication noted at this time.

## 2020-08-15 NOTE — Progress Notes (Signed)
PROGRESS NOTE    Erik [name]  QQP:[id number] DOB: 26-May-1965 DOA: 08/14/2020 PCP: [name], NP     Brief Narrative:  Patient is a 55 year old male with a history of laryngeal carcinoma status post radiation and also requiring a trach collar, G-tube for feeds who presented with swelling in his neck.  His ENT did not notice any changes but his oncologist placed him on prednisone for the swelling.  He took it for 5 days starting on July 5 but he started to get more short of breath with coughing the few days leading up to his admission.  In the emergency department his temperature was 101.1 Fahrenheit and a heart rate in the 140s.  White blood cell count is 20.3 with a checks x-ray showing right upper lobe infiltrate.  COVID test negative.  Antibiotics were started.   New events last 24 hours / Subjective: Patient reports feeling a little bit better today.  He is not normally on oxygen at home but requiring 5 L through his trach collar.  Coughing and breathing are improved.  No fevers since yesterday.  No diarrhea.  Assessment & Plan:   Principal Problem:   Sepsis ([place]) Active Problems:   Laryngeal cancer ([place])   G tube feedings ([place])   CAP (community acquired pneumonia)   Sepsis secondary to CAP: Continue azithromycin and Rocephin daily, day 2.  He is receiving lactated Ringer's at 100 mL/hour.  Monitor CBC, leukocytosis has improved from 20.3-12.5.  Monitor hemoglobin as well while there may be a dilutional component.  Blood cultures pending. Hypokalemia: Replace and monitor BMP.  DVT prophylaxis: Lovenox Code Status: Full Family Communication: Self Coming From: Home Disposition Plan: Home Barriers to Discharge: Clinical improvement  Consultants:  None  Procedures:  None  Antimicrobials:  Anti-infectives (From admission, onward)    Start     Dose/Rate Route Frequency Ordered Stop   08/14/20 1230  cefTRIAXone (ROCEPHIN) 2 g in sodium chloride 0.9 % 100 mL IVPB         2 g 200 mL/hr over 30 Minutes Intravenous Every 24 hours 08/14/20 1229     08/14/20 1230  azithromycin (ZITHROMAX) 500 mg in sodium chloride 0.9 % 250 mL IVPB        500 mg 250 mL/hr over 60 Minutes Intravenous Every 24 hours 08/14/20 1229          Objective: Vitals:   08/15/20 0020 08/15/20 0200 08/15/20 0600 08/15/20 0944  BP:  90/69 107/65 105/66  Pulse:  98 (!) 101 (!) 106  Resp:  (!) 23  (!) 28  Temp: 99.4 F (37.4 C) 99.3 F (37.4 C) 99.9 F (37.7 C) 98.6 F (37 C)  TempSrc: Oral Oral Oral Oral  SpO2:  96% 97% 94%  Weight:      Height:        Intake/Output Summary (Last 24 hours) at 08/15/2020 1331 Last data filed at 08/15/2020 1200 Gross per 24 hour  Intake 1389.51 ml  Output 1625 ml  Net -235.49 ml   Filed Weights   08/14/20 1214  Weight: 54.4 kg    Examination:  General exam: Appears calm and comfortable  Respiratory system: Clear to auscultation. Respiratory effort normal. No respiratory distress. No conversational dyspnea.  Cardiovascular system: S1 & S2 heard, RRR. No murmurs. No pedal edema. Gastrointestinal system: Abdomen is nondistended, soft and nontender. Normal bowel sounds heard. Central nervous system: Alert and oriented. No focal neurological deficits.  Extremities: Symmetric in appearance  Skin: No rashes,  lesions or ulcers on exposed skin  Psychiatry: Judgement and insight appear normal. Mood & affect appropriate.   Data Reviewed: I have personally reviewed following labs and imaging studies  CBC: Recent Labs  Lab 08/14/20 1206 08/15/20 0240  WBC 20.3* 12.5*  NEUTROABS 18.7* 11.8*  HGB 11.2* 8.4*  HCT 34.9* 26.6*  MCV 93.6 94.3  PLT 498* 270   Basic Metabolic Panel: Recent Labs  Lab 08/14/20 1206 08/15/20 0240  NA 137 137  K 4.3 3.3*  CL 94* 99  CO2 28 28  GLUCOSE 101* 93  BUN 53* 40*  CREATININE 1.03 0.80  CALCIUM 9.1 8.2*   GFR: Estimated Creatinine Clearance: 80.3 mL/min (by C-G formula based on SCr of 0.8  mg/dL). Liver Function Tests: Recent Labs  Lab 08/14/20 1206 08/14/20 1810 08/15/20 0240  AST 52* 40 50*  ALT 158* 124* 91*  ALKPHOS 295* 233* 208*  BILITOT 2.6* 2.1* 1.7*  PROT 6.6 6.8 5.4*  ALBUMIN 2.3* 2.3* 1.9*   Coagulation Profile: Recent Labs  Lab 08/14/20 1206  INR 1.2   CBG: Recent Labs  Lab 08/14/20 2154  GLUCAP 156*   Sepsis Labs: Recent Labs  Lab 08/14/20 1206  LATICACIDVEN 1.8    Recent Results (from the past 240 hour(s))  Resp Panel by RT-PCR (Flu A&B, Covid) Nasopharyngeal Swab     Status: None   Collection Time: 08/14/20  2:23 PM   Specimen: Nasopharyngeal Swab; Nasopharyngeal(NP) swabs in vial transport medium  Result Value Ref Range Status   SARS Coronavirus 2 by RT PCR NEGATIVE NEGATIVE Final    Comment: (NOTE) SARS-CoV-2 target nucleic acids are NOT DETECTED.  The SARS-CoV-2 RNA is generally detectable in upper respiratory specimens during the acute phase of infection. The lowest concentration of SARS-CoV-2 viral copies this assay can detect is 138 copies/mL. A negative result does not preclude SARS-Cov-2 infection and should not be used as the sole basis for treatment or other patient management decisions. A negative result may occur with  improper specimen collection/handling, submission of specimen other than nasopharyngeal swab, presence of viral mutation(s) within the areas targeted by this assay, and inadequate number of viral copies(<138 copies/mL). A negative result must be combined with clinical observations, patient history, and epidemiological information. The expected result is Negative.  Fact Sheet for Patients:  EntrepreneurPulse.com.au  Fact Sheet for Healthcare Providers:  IncredibleEmployment.be  This test is no t yet approved or cleared by the [place] FDA and  has been authorized for detection and/or diagnosis of SARS-CoV-2 by FDA under an Emergency Use Authorization (EUA).  This EUA will remain  in effect (meaning this test can be used) for the duration of the COVID-19 declaration under Section 564(b)(1) of the Act, 21 U.S.C.section 360bbb-3(b)(1), unless the authorization is terminated  or revoked sooner.       Influenza A by PCR NEGATIVE NEGATIVE Final   Influenza B by PCR NEGATIVE NEGATIVE Final    Comment: (NOTE) The Xpert Xpress SARS-CoV-2/FLU/RSV plus assay is intended as an aid in the diagnosis of influenza from Nasopharyngeal swab specimens and should not be used as a sole basis for treatment. Nasal washings and aspirates are unacceptable for Xpert Xpress SARS-CoV-2/FLU/RSV testing.  Fact Sheet for Patients: EntrepreneurPulse.com.au  Fact Sheet for Healthcare Providers: IncredibleEmployment.be  This test is not yet approved or cleared by the [place] FDA and has been authorized for detection and/or diagnosis of SARS-CoV-2 by FDA under an Emergency Use Authorization (EUA). This EUA will remain in effect (meaning  this test can be used) for the duration of the COVID-19 declaration under Section 564(b)(1) of the Act, 21 U.S.C. section 360bbb-3(b)(1), unless the authorization is terminated or revoked.  Performed at [facility], [place] [address], [place], [place] [address]       Radiology Studies: DG Chest Port 1 View  Result Date: 08/14/2020 CLINICAL DATA:  Possible sepsis 8 a EXAM: PORTABLE CHEST 1 VIEW COMPARISON:  Prior chest x-ray 03/04/2020 FINDINGS: Marked new patchy airspace opacity in the right upper lobe. A right IJ single-lumen power injectable port catheter in place. The tip of the catheter overlies the distal SVC. Cardiac and mediastinal contours are unchanged. Tracheostomy tube present. Tip midline and at the level of the clavicles. Atherosclerotic calcification again noted in the transverse aorta. No acute osseous abnormality. IMPRESSION: Right upper lobe pneumonia.  Electronically Signed   By: Jacqulynn Cadet M.D.   On: 08/14/2020 12:26   US Abdomen Limited RUQ (LIVER/GB)  Result Date: 08/14/2020 CLINICAL DATA:  Abnormal liver function tests, laryngeal malignancy EXAM: ULTRASOUND ABDOMEN LIMITED RIGHT UPPER QUADRANT COMPARISON:  04/05/2020 FINDINGS: Gallbladder: No gallstones or wall thickening visualized. No sonographic Murphy sign noted by sonographer. Common bile duct: Diameter: 4 mm Liver: No focal lesion identified. Within normal limits in parenchymal echogenicity. Portal vein is patent on color Doppler imaging with normal direction of blood flow towards the liver. Other: Fluid-filled structure adjacent to the left lobe of the liver denoted by the sonographer on the provided images likely reflects a fluid-filled portion of the stomach or proximal duodenum. IMPRESSION: 1. Unremarkable right upper quadrant ultrasound. Electronically Signed   By: Randa Ngo M.D.   On: 08/14/2020 17:24     Scheduled Meds:  Chlorhexidine Gluconate Cloth  6 each Topical Daily   enoxaparin (LOVENOX) injection  40 mg Subcutaneous Q24H   feeding supplement (OSMOLITE 1.5 CAL)  237 mL Per Tube 6 X Daily   potassium chloride  20 mEq Per Tube BID   sodium chloride flush  10-40 mL Intracatheter Q12H   Continuous Infusions:  azithromycin Stopped (08/14/20 1419)   cefTRIAXone (ROCEPHIN)  IV Stopped (08/14/20 1419)   lactated ringers     lactated ringers 100 mL/hr at 08/15/20 1000     LOS: 0 days    Time spent: 30 minutes   Shelda Pal, DO [facility] 08/15/2020, 1:31 PM   Available via Epic secure chat 7am-7pm After these hours, please refer to coverage provider listed on [email or web address]

## 2020-08-16 DIAGNOSIS — A419 Sepsis, unspecified organism: Secondary | ICD-10-CM | POA: Diagnosis not present

## 2020-08-16 DIAGNOSIS — J189 Pneumonia, unspecified organism: Secondary | ICD-10-CM | POA: Diagnosis not present

## 2020-08-16 DIAGNOSIS — J9601 Acute respiratory failure with hypoxia: Secondary | ICD-10-CM | POA: Diagnosis not present

## 2020-08-16 LAB — URINE CULTURE: Culture: NO GROWTH

## 2020-08-16 LAB — BASIC METABOLIC PANEL
Anion gap: 8 (ref 5–15)
BUN: 23 mg/dL — ABNORMAL HIGH (ref 6–20)
CO2: 29 mmol/L (ref 22–32)
Calcium: 8.6 mg/dL — ABNORMAL LOW (ref 8.9–10.3)
Chloride: 100 mmol/L (ref 98–111)
Creatinine, Ser: 0.57 mg/dL — ABNORMAL LOW (ref 0.61–1.24)
GFR, Estimated: >60 mL/min (ref >60)
Glucose, Bld: 92 mg/dL (ref 70–99)
Potassium: 3.3 mmol/L — ABNORMAL LOW (ref 3.5–5.1)
Sodium: 137 mmol/L (ref 135–145)

## 2020-08-16 LAB — CBC
HCT: 26.4 % — ABNORMAL LOW (ref 39.0–52.0)
Hemoglobin: 8.2 g/dL — ABNORMAL LOW (ref 13.0–17.0)
MCH: 29.3 pg (ref 26.0–34.0)
MCHC: 31.1 g/dL (ref 30.0–36.0)
MCV: 94.3 fL (ref 80.0–100.0)
Platelets: 426 10*3/uL — ABNORMAL HIGH (ref 150–400)
RBC: 2.8 MIL/uL — ABNORMAL LOW (ref 4.22–5.81)
RDW: 15.7 % — ABNORMAL HIGH (ref 11.5–15.5)
WBC: 13.8 10*3/uL — ABNORMAL HIGH (ref 4.0–10.5)
nRBC: 0 % (ref 0.0–0.2)

## 2020-08-16 LAB — MRSA NEXT GEN BY PCR, NASAL: MRSA by PCR Next Gen: NOT DETECTED

## 2020-08-16 MED ORDER — VANCOMYCIN HCL 1000 MG/200ML IV SOLN
1000.0000 mg | Freq: Once | INTRAVENOUS | Status: AC
  Administered 2020-08-16: 1000 mg via INTRAVENOUS
  Filled 2020-08-16: qty 200

## 2020-08-16 MED ORDER — METRONIDAZOLE 500 MG/100ML IV SOLN
500.0000 mg | Freq: Three times a day (TID) | INTRAVENOUS | Status: DC
  Administered 2020-08-16 – 2020-08-21 (×14): 500 mg via INTRAVENOUS
  Filled 2020-08-16 (×15): qty 100

## 2020-08-16 MED ORDER — VANCOMYCIN HCL 750 MG/150ML IV SOLN
750.0000 mg | Freq: Two times a day (BID) | INTRAVENOUS | Status: DC
  Administered 2020-08-17: 750 mg via INTRAVENOUS
  Filled 2020-08-16: qty 150

## 2020-08-16 MED ORDER — SODIUM CHLORIDE 0.9 % IV SOLN
2.0000 g | Freq: Three times a day (TID) | INTRAVENOUS | Status: DC
  Administered 2020-08-16 – 2020-08-21 (×15): 2 g via INTRAVENOUS
  Filled 2020-08-16 (×16): qty 2

## 2020-08-16 MED ORDER — POTASSIUM CHLORIDE 20 MEQ PO PACK
40.0000 meq | PACK | Freq: Two times a day (BID) | ORAL | Status: AC
  Administered 2020-08-16 – 2020-08-17 (×4): 40 meq
  Filled 2020-08-16 (×4): qty 2

## 2020-08-16 MED ORDER — ACETAMINOPHEN 160 MG/5ML PO SOLN
650.0000 mg | Freq: Four times a day (QID) | ORAL | Status: DC | PRN
  Administered 2020-08-16 – 2020-08-18 (×4): 650 mg
  Filled 2020-08-16 (×4): qty 20.3

## 2020-08-16 NOTE — Progress Notes (Signed)
Pt RED mews.  Dr. Sloan Leiter informed; new orders placed.  Will carry out orders.  PRN tylenol given per orders.  Will continue to monitor patient.

## 2020-08-16 NOTE — Plan of Care (Signed)
  Problem: Education: Goal: Knowledge of General Education information will improve Description: Including pain rating scale, medication(s)/side effects and non-pharmacologic comfort measures Outcome: Progressing   Problem: Clinical Measurements: Goal: Diagnostic test results will improve Outcome: Progressing   Problem: Activity: Goal: Risk for activity intolerance will decrease Outcome: Progressing   

## 2020-08-16 NOTE — Progress Notes (Signed)
   08/16/20 1729  Vitals  Temp (!) 102.4 F (39.1 C)  Temp Source Oral  BP 129/81  MAP (mmHg) 96  BP Location Right Arm  BP Method Automatic  Patient Position (if appropriate) Lying  Pulse Rate (!) 112  Pulse Rate Source Monitor  Resp (!) 29  MEWS COLOR  MEWS Score Color Red  Oxygen Therapy  SpO2 100 %  MEWS Score  MEWS Temp 2  MEWS Systolic 0  MEWS Pulse 2  MEWS RR 2  MEWS LOC 0  MEWS Score 6

## 2020-08-16 NOTE — Progress Notes (Signed)
Pharmacy Antibiotic Note  Erik [name] is a 55 y.o. male admitted on 08/14/2020 with  sepsis secondary to pneumonia .  Patient initially placed on Ceftriaxone and Azithromycin, now transitioned to Vancomycin, Cefepime, and Metronidazole due to concern for aspiration pneumonia. Pharmacy consulted for Vancomycin dosing.   Plan: Vancomycin 1g IV x 1, then 750mg  IV q12h Vancomycin levels at steady state, as indicated Cefepime 2g IV q8h Metronidazole 500mg  IV q8h Monitor renal function, cultures, clinical course  Height: 5\' 9"  (175.3 cm) Weight: 54.4 kg (120 lb) IBW/kg (Calculated) : 70.7  Temp (24hrs), Avg:100.2 F (37.9 C), Min:98.5 F (36.9 C), Max:102.4 F (39.1 C)  Recent Labs  Lab 08/14/20 1206 08/15/20 0240 08/16/20 0350  WBC 20.3* 12.5* 13.8*  CREATININE 1.03 0.80 0.57*  LATICACIDVEN 1.8  --   --     Estimated Creatinine Clearance: 80.3 mL/min (A) (by C-G formula based on SCr of 0.57 mg/dL (L)).    No Known Allergies  Antimicrobials this admission: 7/16 Ceftriaxone >> 7/18 7/16 Azithromycin >> 7/18 7/18 Cefepime >> 7/18 Metronidazole >> 7/18 Vancomycin >>  Dose adjustments this admission: --  Microbiology results: 7/16 BCx: NGTD 7/16 Resp panel: COVID negative, Influenza A/B negative  7/17 UCx: NGF  7/18 Sputum:   7/18 MRSA PCR:   Thank you for allowing pharmacy to be a part of this patient's care.  Erik [name] 08/16/2020 6:04 PM

## 2020-08-16 NOTE — Progress Notes (Signed)
PROGRESS NOTE    Erik [name]  WGN:[id number] DOB: 1965/02/20 DOA: 08/14/2020 PCP: [name], NP    Brief Narrative:  55 year old male with a history of laryngeal carcinoma status post radiation and also requiring a trach collar, G-tube for feeds who presented with swelling in his neck.  His ENT did not notice any changes but his oncologist placed him on prednisone for the swelling.  He took it for 5 days starting on July 5 but he started to get more short of breath with coughing the few days leading up to his admission.  In the emergency department his temperature was 101.1 Fahrenheit and a heart rate in the 140s.  White blood cell count is 20.3 with a checks x-ray showing right upper lobe infiltrate.  COVID test negative.  Antibiotics were started.  Assessment & Plan:   Principal Problem:   Sepsis ([place]) Active Problems:   Laryngeal cancer ([place])   G tube feedings ([place])   CAP (community acquired pneumonia)   Acute respiratory failure ([place])  Sepsis secondary to community-acquired pneumonia with hypoxemia: Patient does have tracheostomy, however is fairly stable so treat as community-acquired pneumonia. Continue Rocephin and azithromycin.  We will continue chest physiotherapy as much as he can do.  Frequent suction, mobility and tracheostomy care with respite therapist. Blood cultures and sputum cultures negative so far. Continue to attempt to wean off the trach collar.  Hypokalemia: Replace aggressively through the tube.  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  Is stable.   DVT prophylaxis: enoxaparin (LOVENOX) injection 40 mg Start: 08/14/20 1545   Code Status: Full code Family Communication: None at the bedside Disposition Plan: Status is: Inpatient  Remains inpatient appropriate because:IV treatments appropriate due to intensity of illness or inability to take PO and Inpatient level of care appropriate due to severity of  illness  Dispo: The patient is from: Home              Anticipated d/c is to: Home              Patient currently is not medically stable to d/c.   Difficult to place patient No         Consultants:  None  Procedures:  None  Antimicrobials:  Rocephin azithromycin 7/16---   Subjective: Patient seen and examined in the morning rounds.  No overnight events.  He denies any chest pain or shortness of breath.  He has some increasing secretions and had fever overnight. He looks comfortable.  He communicates with writing.  No family at bedside.  Currently on 5 L oxygen through the trach collar.  Objective: Vitals:   08/16/20 0828 08/16/20 1000 08/16/20 1138 08/16/20 1347  BP: 120/66 113/73 113/73 118/75  Pulse: (!) 108 95 79 99  Resp: (!) 25 (!) 24 (!) 24 (!) 24  Temp:  98.5 F (36.9 C)  98.6 F (37 C)  TempSrc:  Oral  Oral  SpO2: 95% 91% 95% 99%  Weight:      Height:        Intake/Output Summary (Last 24 hours) at 08/16/2020 1352 Last data filed at 08/16/2020 0953 Gross per 24 hour  Intake 2846.26 ml  Output 1800 ml  Net 1046.26 ml   Filed Weights   08/14/20 1214  Weight: 54.4 kg    Examination:  General exam: Appears comfortable. Respiratory system: Mostly conducted upper airway noise.  Patient looks comfortable on 5 L through the trach collar.  Cardiovascular system: S1 & S2 heard, RRR.  Gastrointestinal system: Abdomen is nondistended, soft and nontender. No organomegaly or masses felt. Normal bowel sounds heard. G-tube in place. Central nervous system: Alert and oriented. No focal neurological deficits. Extremities: Symmetric 5 x 5 power. Skin: No rashes, lesions or ulcers Psychiatry: Judgement and insight appear normal. Mood & affect appropriate.     Data Reviewed: I have personally reviewed following labs and imaging studies  CBC: Recent Labs  Lab 08/14/20 1206 08/15/20 0240 08/16/20 0350  WBC 20.3* 12.5* 13.8*  NEUTROABS 18.7* 11.8*  --   HGB  11.2* 8.4* 8.2*  HCT 34.9* 26.6* 26.4*  MCV 93.6 94.3 94.3  PLT 498* 393 194*   Basic Metabolic Panel: Recent Labs  Lab 08/14/20 1206 08/15/20 0240 08/16/20 0350  NA 137 137 137  K 4.3 3.3* 3.3*  CL 94* 99 100  CO2 28 28 29   GLUCOSE 101* 93 92  BUN 53* 40* 23*  CREATININE 1.03 0.80 0.57*  CALCIUM 9.1 8.2* 8.6*   GFR: Estimated Creatinine Clearance: 80.3 mL/min (A) (by C-G formula based on SCr of 0.57 mg/dL (L)). Liver Function Tests: Recent Labs  Lab 08/14/20 1206 08/14/20 1810 08/15/20 0240  AST 52* 40 50*  ALT 158* 124* 91*  ALKPHOS 295* 233* 208*  BILITOT 2.6* 2.1* 1.7*  PROT 6.6 6.8 5.4*  ALBUMIN 2.3* 2.3* 1.9*   No results for input(s): LIPASE, AMYLASE in the last 168 hours. No results for input(s): AMMONIA in the last 168 hours. Coagulation Profile: Recent Labs  Lab 08/14/20 1206  INR 1.2   Cardiac Enzymes: No results for input(s): CKTOTAL, CKMB, CKMBINDEX, TROPONINI in the last 168 hours. BNP (last 3 results) No results for input(s): PROBNP in the last 8760 hours. HbA1C: No results for input(s): HGBA1C in the last 72 hours. CBG: Recent Labs  Lab 08/14/20 2154  GLUCAP 156*   Lipid Profile: No results for input(s): CHOL, HDL, LDLCALC, TRIG, CHOLHDL, LDLDIRECT in the last 72 hours. Thyroid Function Tests: No results for input(s): TSH, T4TOTAL, FREET4, T3FREE, THYROIDAB in the last 72 hours. Anemia Panel: No results for input(s): VITAMINB12, FOLATE, FERRITIN, TIBC, IRON, RETICCTPCT in the last 72 hours. Sepsis Labs: Recent Labs  Lab 08/14/20 1206  LATICACIDVEN 1.8    Recent Results (from the past 240 hour(s))  Blood culture (routine single)     Status: None (Preliminary result)   Collection Time: 08/14/20 11:21 AM   Specimen: BLOOD  Result Value Ref Range Status   Specimen Description   Final    BLOOD RIGHT ANTECUBITAL Performed at [facility] [address], [place], [place] [address]    Special Requests   Final     BOTTLES DRAWN AEROBIC AND ANAEROBIC Blood Culture results may not be optimal due to an excessive volume of blood received in culture bottles Performed at [facility] [address] [place], [place] [address]    Culture   Final    NO GROWTH 2 DAYS Performed at [facility] [address] [place], [place] [address]    Report Status PENDING  Incomplete  Culture, blood (single)     Status: None (Preliminary result)   Collection Time: 08/14/20 12:29 PM   Specimen: BLOOD  Result Value Ref Range Status   Specimen Description   Final    BLOOD BLOOD RIGHT FOREARM Performed at [facility] [address] [place], [place] [address]    Special Requests   Final  BOTTLES DRAWN AEROBIC AND ANAEROBIC Blood Culture adequate volume Performed at [facility] [address], [place], [place] [address]    Culture   Final    NO GROWTH 2 DAYS Performed at [facility] [address], [place], [place] [address]    Report Status PENDING  Incomplete  Resp Panel by RT-PCR (Flu A&B, Covid) Nasopharyngeal Swab     Status: None   Collection Time: 08/14/20  2:23 PM   Specimen: Nasopharyngeal Swab; Nasopharyngeal(NP) swabs in vial transport medium  Result Value Ref Range Status   SARS Coronavirus 2 by RT PCR NEGATIVE NEGATIVE Final    Comment: (NOTE) SARS-CoV-2 target nucleic acids are NOT DETECTED.  The SARS-CoV-2 RNA is generally detectable in upper respiratory specimens during the acute phase of infection. The lowest concentration of SARS-CoV-2 viral copies this assay can detect is 138 copies/mL. A negative result does not preclude SARS-Cov-2 infection and should not be used as the sole basis for treatment or other patient management decisions. A negative result may occur with  improper specimen collection/handling, submission of specimen other than nasopharyngeal swab, presence of viral mutation(s) within the areas targeted  by this assay, and inadequate number of viral copies(<138 copies/mL). A negative result must be combined with clinical observations, patient history, and epidemiological information. The expected result is Negative.  Fact Sheet for Patients:  EntrepreneurPulse.com.au  Fact Sheet for Healthcare Providers:  IncredibleEmployment.be  This test is no t yet approved or cleared by the [place] FDA and  has been authorized for detection and/or diagnosis of SARS-CoV-2 by FDA under an Emergency Use Authorization (EUA). This EUA will remain  in effect (meaning this test can be used) for the duration of the COVID-19 declaration under Section 564(b)(1) of the Act, 21 U.S.C.section 360bbb-3(b)(1), unless the authorization is terminated  or revoked sooner.       Influenza A by PCR NEGATIVE NEGATIVE Final   Influenza B by PCR NEGATIVE NEGATIVE Final    Comment: (NOTE) The Xpert Xpress SARS-CoV-2/FLU/RSV plus assay is intended as an aid in the diagnosis of influenza from Nasopharyngeal swab specimens and should not be used as a sole basis for treatment. Nasal washings and aspirates are unacceptable for Xpert Xpress SARS-CoV-2/FLU/RSV testing.  Fact Sheet for Patients: EntrepreneurPulse.com.au  Fact Sheet for Healthcare Providers: IncredibleEmployment.be  This test is not yet approved or cleared by the [place] FDA and has been authorized for detection and/or diagnosis of SARS-CoV-2 by FDA under an Emergency Use Authorization (EUA). This EUA will remain in effect (meaning this test can be used) for the duration of the COVID-19 declaration under Section 564(b)(1) of the Act, 21 U.S.C. section 360bbb-3(b)(1), unless the authorization is terminated or revoked.  Performed at [facility], [place] [address], [place], [place] [address]   Urine Culture     Status: None   Collection Time: 08/15/20  12:51 PM   Specimen: In/Out Cath Urine  Result Value Ref Range Status   Specimen Description   Final    IN/OUT CATH URINE Performed at [facility] [address], [place], [place] [address]    Special Requests   Final    NONE Performed at [facility], [place] [address], [place], [place] [address]    Culture   Final    NO GROWTH Performed at [facility], [place] [address] [place],  [address]    Report Status 08/16/2020 FINAL  Final  Radiology Studies: US Abdomen Limited RUQ (LIVER/GB)  Result Date: 08/14/2020 CLINICAL DATA:  Abnormal liver function tests, laryngeal malignancy EXAM: ULTRASOUND ABDOMEN LIMITED RIGHT UPPER QUADRANT COMPARISON:  04/05/2020 FINDINGS: Gallbladder: No gallstones or wall thickening visualized. No sonographic Murphy sign noted by sonographer. Common bile duct: Diameter: 4 mm Liver: No focal lesion identified. Within normal limits in parenchymal echogenicity. Portal vein is patent on color Doppler imaging with normal direction of blood flow towards the liver. Other: Fluid-filled structure adjacent to the left lobe of the liver denoted by the sonographer on the provided images likely reflects a fluid-filled portion of the stomach or proximal duodenum. IMPRESSION: 1. Unremarkable right upper quadrant ultrasound. Electronically Signed   By: Randa Ngo M.D.   On: 08/14/2020 17:24        Scheduled Meds:  Chlorhexidine Gluconate Cloth  6 each Topical Daily   enoxaparin (LOVENOX) injection  40 mg Subcutaneous Q24H   feeding supplement (OSMOLITE 1.5 CAL)  237 mL Per Tube 6 X Daily   potassium chloride  40 mEq Per Tube BID   sodium chloride flush  10-40 mL Intracatheter Q12H   Continuous Infusions:  azithromycin 500 mg (08/16/20 1311)   cefTRIAXone (ROCEPHIN)  IV 2 g (08/16/20 1237)   lactated ringers       LOS: 1 day    Time spent: 25 minutes    Barb Merino, MD [facility] Pager  [phone]

## 2020-08-17 DIAGNOSIS — J9601 Acute respiratory failure with hypoxia: Secondary | ICD-10-CM | POA: Diagnosis not present

## 2020-08-17 DIAGNOSIS — J189 Pneumonia, unspecified organism: Secondary | ICD-10-CM | POA: Diagnosis not present

## 2020-08-17 DIAGNOSIS — A419 Sepsis, unspecified organism: Secondary | ICD-10-CM | POA: Diagnosis not present

## 2020-08-17 LAB — CBC
HCT: 27.1 % — ABNORMAL LOW (ref 39.0–52.0)
Hemoglobin: 8.5 g/dL — ABNORMAL LOW (ref 13.0–17.0)
MCH: 30.1 pg (ref 26.0–34.0)
MCHC: 31.4 g/dL (ref 30.0–36.0)
MCV: 96.1 fL (ref 80.0–100.0)
Platelets: 441 10*3/uL — ABNORMAL HIGH (ref 150–400)
RBC: 2.82 MIL/uL — ABNORMAL LOW (ref 4.22–5.81)
RDW: 15.9 % — ABNORMAL HIGH (ref 11.5–15.5)
WBC: 13.4 10*3/uL — ABNORMAL HIGH (ref 4.0–10.5)
nRBC: 0 % (ref 0.0–0.2)

## 2020-08-17 LAB — BASIC METABOLIC PANEL
Anion gap: 6 (ref 5–15)
BUN: 20 mg/dL (ref 6–20)
CO2: 27 mmol/L (ref 22–32)
Calcium: 8.5 mg/dL — ABNORMAL LOW (ref 8.9–10.3)
Chloride: 103 mmol/L (ref 98–111)
Creatinine, Ser: 0.52 mg/dL — ABNORMAL LOW (ref 0.61–1.24)
GFR, Estimated: >60 mL/min (ref >60)
Glucose, Bld: 102 mg/dL — ABNORMAL HIGH (ref 70–99)
Potassium: 3.8 mmol/L (ref 3.5–5.1)
Sodium: 136 mmol/L (ref 135–145)

## 2020-08-17 MED ORDER — VANCOMYCIN HCL 750 MG/150ML IV SOLN
750.0000 mg | Freq: Two times a day (BID) | INTRAVENOUS | Status: AC
Start: 2020-08-17 — End: 2020-08-18
  Administered 2020-08-17 – 2020-08-18 (×2): 750 mg via INTRAVENOUS
  Filled 2020-08-17 (×2): qty 150

## 2020-08-17 NOTE — Progress Notes (Signed)
Pt refused cpt tonight.  RT will continue tomorrow per pt request.

## 2020-08-17 NOTE — Plan of Care (Signed)
  Problem: Education: Goal: Knowledge of General Education information will improve Description: Including pain rating scale, medication(s)/side effects and non-pharmacologic comfort measures Outcome: Progressing   Problem: Clinical Measurements: Goal: Cardiovascular complication will be avoided Outcome: Progressing   Problem: Elimination: Goal: Will not experience complications related to bowel motility Outcome: Progressing Goal: Will not experience complications related to urinary retention Outcome: Progressing   Problem: Pain Managment: Goal: General experience of comfort will improve Outcome: Progressing

## 2020-08-17 NOTE — TOC Progression Note (Signed)
Transition of Care [facility]) - Progression Note    Patient Details  Name: Erik [name] MRN: [id number] Date of Birth: July 08, 1965  Transition of Care [facility]) CM/SW Contact  Purcell Mouton, RN Phone Number: 08/17/2020, 9:15 AM  Clinical Narrative:    Pt from home with his wife and plan to return home.    Expected Discharge Plan: Home/Self Care Barriers to Discharge: No Barriers Identified  Expected Discharge Plan and Services Expected Discharge Plan: Home/Self Care       Living arrangements for the past 2 months: Single Family Home                                       Social Determinants of Health (SDOH) Interventions    Readmission Risk Interventions No flowsheet data found.

## 2020-08-17 NOTE — Progress Notes (Signed)
PROGRESS NOTE    Erik [name]  DVV:[id number] DOB: 10-29-1965 DOA: 08/14/2020 PCP: [name], NP    Brief Narrative:  55 year old male with history of laryngeal carcinoma status post radiation and also requiring a trach collar, G-tube for feeds who presented with swelling in his neck.  His ENT did not notice any changes but his oncologist placed him on prednisone for the swelling.  He took it for 5 days starting on July 5 but he started to get more short of breath with coughing after few days leading up to his admission.  In the emergency department his temperature was 101.1 Fahrenheit and heart rate in the 140s.  White blood cell count is 20.3 with a checks x-ray showing right upper lobe infiltrate.  COVID test negative.  Antibiotics were started.  Assessment & Plan:   Principal Problem:   Sepsis ([place]) Active Problems:   Laryngeal cancer ()   G tube feedings ([place])   CAP (community acquired pneumonia)   Acute respiratory failure ([place])  Sepsis secondary to community-acquired pneumonia with hypoxemia:  Patient continues to spike fever despite is started on Rocephin and azithromycin.  Blood cultures negative.  With significant tracheostomy secretions.   Antibiotic broadened to vancomycin, cefepime and Flagyl 7/18.  Some clinical improvement today.   Blood cultures negative so far.  We will start aggressive chest physiotherapy by RT.  Frequent suction and mobility.   Expectorated sputum culture sent.   Keep on oxygen to keep saturations more than 90%, needing 3 to 4 L of oxygen today. He does not use oxygen at baseline.  Hypokalemia: Replaced with improvement.    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  Is stable.  Abnormal liver function test: Gradually improving.  Unknown whether he suffered shock liver.  Blood pressure is stabilized now.  Already improving.  Recheck tomorrow morning.   DVT prophylaxis: enoxaparin (LOVENOX)  injection 40 mg Start: 08/14/20 1545   Code Status: Full code Family Communication: None at the bedside Disposition Plan: Status is: Inpatient  Remains inpatient appropriate because:IV treatments appropriate due to intensity of illness or inability to take PO and Inpatient level of care appropriate due to severity of illness  Dispo: The patient is from: Home              Anticipated d/c is to: Home              Patient currently is not medically stable to d/c.   Difficult to place patient No         Consultants:  None  Procedures:  None  Antimicrobials:  Rocephin azithromycin 7/16--- 7/18 Vancomycin, cefepime and Flagyl 7/18--   Subjective: Patient seen and examined in the morning rounds.  He was sitting in chair.  Has a lot of secretions otherwise denies any complaints.  Temperature maximum 102 for last 24 hours.  Patient is trying to stay off the oxygen, however was saturating 87%. Objective: Vitals:   08/17/20 0006 08/17/20 0318 08/17/20 0541 08/17/20 1059  BP: 107/73  113/67   Pulse: (!) 102 (!) 115 (!) 116 (!) 111  Resp: (!) 25 (!) 23 (!) 31 (!) 24  Temp: 99.3 F (37.4 C)  (!) 100.5 F (38.1 C)   TempSrc: Oral  Oral   SpO2: 96% 96% 96% 99%  Weight:      Height:        Intake/Output Summary (Last 24 hours) at 08/17/2020 1104 Last data filed  at 08/17/2020 0937 Gross per 24 hour  Intake 774 ml  Output 2525 ml  Net -1751 ml   Filed Weights   08/14/20 1214  Weight: 54.4 kg    Examination:  General exam: Appears comfortable.  Slightly anxious. Respiratory system: Mostly conducted upper airway sounds all over and coarse crackles.  Mildly anxious but fairly comfortable on trach. Cardiovascular system: S1 & S2 heard, RRR.  Gastrointestinal system: Abdomen is nondistended, soft and nontender. No organomegaly or masses felt. Normal bowel sounds heard. G-tube in place. Central nervous system: Alert and oriented. No focal neurological deficits. Extremities:  Symmetric 5 x 5 power. Skin: No rashes, lesions or ulcers    Data Reviewed: I have personally reviewed following labs and imaging studies  CBC: Recent Labs  Lab 08/14/20 1206 08/15/20 0240 08/16/20 0350 08/17/20 0408  WBC 20.3* 12.5* 13.8* 13.4*  NEUTROABS 18.7* 11.8*  --   --   HGB 11.2* 8.4* 8.2* 8.5*  HCT 34.9* 26.6* 26.4* 27.1*  MCV 93.6 94.3 94.3 96.1  PLT 498* 393 426* 924*   Basic Metabolic Panel: Recent Labs  Lab 08/14/20 1206 08/15/20 0240 08/16/20 0350 08/17/20 0408  NA 137 137 137 136  K 4.3 3.3* 3.3* 3.8  CL 94* 99 100 103  CO2 28 28 29 27   GLUCOSE 101* 93 92 102*  BUN 53* 40* 23* 20  CREATININE 1.03 0.80 0.57* 0.52*  CALCIUM 9.1 8.2* 8.6* 8.5*   GFR: Estimated Creatinine Clearance: 80.3 mL/min (A) (by C-G formula based on SCr of 0.52 mg/dL (L)). Liver Function Tests: Recent Labs  Lab 08/14/20 1206 08/14/20 1810 08/15/20 0240  AST 52* 40 50*  ALT 158* 124* 91*  ALKPHOS 295* 233* 208*  BILITOT 2.6* 2.1* 1.7*  PROT 6.6 6.8 5.4*  ALBUMIN 2.3* 2.3* 1.9*   No results for input(s): LIPASE, AMYLASE in the last 168 hours. No results for input(s): AMMONIA in the last 168 hours. Coagulation Profile: Recent Labs  Lab 08/14/20 1206  INR 1.2   Cardiac Enzymes: No results for input(s): CKTOTAL, CKMB, CKMBINDEX, TROPONINI in the last 168 hours. BNP (last 3 results) No results for input(s): PROBNP in the last 8760 hours. HbA1C: No results for input(s): HGBA1C in the last 72 hours. CBG: Recent Labs  Lab 08/14/20 2154  GLUCAP 156*   Lipid Profile: No results for input(s): CHOL, HDL, LDLCALC, TRIG, CHOLHDL, LDLDIRECT in the last 72 hours. Thyroid Function Tests: No results for input(s): TSH, T4TOTAL, FREET4, T3FREE, THYROIDAB in the last 72 hours. Anemia Panel: No results for input(s): VITAMINB12, FOLATE, FERRITIN, TIBC, IRON, RETICCTPCT in the last 72 hours. Sepsis Labs: Recent Labs  Lab 08/14/20 1206  LATICACIDVEN 1.8    Recent Results  (from the past 240 hour(s))  Blood culture (routine single)     Status: None (Preliminary result)   Collection Time: 08/14/20 11:21 AM   Specimen: BLOOD  Result Value Ref Range Status   Specimen Description   Final    BLOOD RIGHT ANTECUBITAL Performed at [facility] [address] [place], [place] [address]    Special Requests   Final    BOTTLES DRAWN AEROBIC AND ANAEROBIC Blood Culture results may not be optimal due to an excessive volume of blood received in culture bottles Performed at [facility] [address], [place], [place] [address]    Culture   Final    NO GROWTH 3 DAYS Performed at [facility], [place] [address], [place], [place] [address]  Report Status PENDING  Incomplete  Culture, blood (single)     Status: None (Preliminary result)   Collection Time: 08/14/20 12:29 PM   Specimen: BLOOD  Result Value Ref Range Status   Specimen Description   Final    BLOOD BLOOD RIGHT FOREARM Performed at [facility] [address] [place], [place] [address]    Special Requests   Final    BOTTLES DRAWN AEROBIC AND ANAEROBIC Blood Culture adequate volume Performed at [facility] [address], [place], [place] [address]    Culture   Final    NO GROWTH 3 DAYS Performed at [facility], [place] [address], [place],  [address]    Report Status PENDING  Incomplete  Resp Panel by RT-PCR (Flu A&B, Covid) Nasopharyngeal Swab     Status: None   Collection Time: 08/14/20  2:23 PM   Specimen: Nasopharyngeal Swab; Nasopharyngeal(NP) swabs in vial transport medium  Result Value Ref Range Status   SARS Coronavirus 2 by RT PCR NEGATIVE NEGATIVE Final    Comment: (NOTE) SARS-CoV-2 target nucleic acids are NOT DETECTED.  The SARS-CoV-2 RNA is generally detectable in upper respiratory specimens during the acute phase of infection. The lowest concentration of SARS-CoV-2 viral copies this  assay can detect is 138 copies/mL. A negative result does not preclude SARS-Cov-2 infection and should not be used as the sole basis for treatment or other patient management decisions. A negative result may occur with  improper specimen collection/handling, submission of specimen other than nasopharyngeal swab, presence of viral mutation(s) within the areas targeted by this assay, and inadequate number of viral copies(<138 copies/mL). A negative result must be combined with clinical observations, patient history, and epidemiological information. The expected result is Negative.  Fact Sheet for Patients:  EntrepreneurPulse.com.au  Fact Sheet for Healthcare Providers:  IncredibleEmployment.be  This test is no t yet approved or cleared by the [place] FDA and  has been authorized for detection and/or diagnosis of SARS-CoV-2 by FDA under an Emergency Use Authorization (EUA). This EUA will remain  in effect (meaning this test can be used) for the duration of the COVID-19 declaration under Section 564(b)(1) of the Act, 21 U.S.C.section 360bbb-3(b)(1), unless the authorization is terminated  or revoked sooner.       Influenza A by PCR NEGATIVE NEGATIVE Final   Influenza B by PCR NEGATIVE NEGATIVE Final    Comment: (NOTE) The Xpert Xpress SARS-CoV-2/FLU/RSV plus assay is intended as an aid in the diagnosis of influenza from Nasopharyngeal swab specimens and should not be used as a sole basis for treatment. Nasal washings and aspirates are unacceptable for Xpert Xpress SARS-CoV-2/FLU/RSV testing.  Fact Sheet for Patients: EntrepreneurPulse.com.au  Fact Sheet for Healthcare Providers: IncredibleEmployment.be  This test is not yet approved or cleared by the [place] FDA and has been authorized for detection and/or diagnosis of SARS-CoV-2 by FDA under an Emergency Use Authorization (EUA). This EUA will  remain in effect (meaning this test can be used) for the duration of the COVID-19 declaration under Section 564(b)(1) of the Act, 21 U.S.C. section 360bbb-3(b)(1), unless the authorization is terminated or revoked.  Performed at [facility], [place] [address], [place],  [address]   Urine Culture     Status: None   Collection Time: 08/15/20 12:51 PM   Specimen: In/Out Cath Urine  Result Value Ref Range Status   Specimen Description   Final    IN/OUT CATH URINE Performed at [facility]  [place] [address], [place], [place] [address]    Special Requests   Final    NONE Performed at [facility], [place] [address], [place], [place] [address]    Culture   Final    NO GROWTH Performed at [facility], [place] [address] [place], [place] [address]    Report Status 08/16/2020 FINAL  Final  MRSA Next Gen by PCR, Nasal     Status: None   Collection Time: 08/16/20  9:01 PM   Specimen: Nasal Mucosa; Nasal Swab  Result Value Ref Range Status   MRSA by PCR Next Gen NOT DETECTED NOT DETECTED Final    Comment: (NOTE) The GeneXpert MRSA Assay (FDA approved for NASAL specimens only), is one component of a comprehensive MRSA colonization surveillance program. It is not intended to diagnose MRSA infection nor to guide or monitor treatment for MRSA infections. Test performance is not FDA approved in patients less than 58 years old. Performed at [facility], [place] [address], [place], [place] [address]          Radiology Studies: No results found.      Scheduled Meds:  Chlorhexidine Gluconate Cloth  6 each Topical Daily   enoxaparin (LOVENOX) injection  40 mg Subcutaneous Q24H   feeding supplement (OSMOLITE 1.5 CAL)  237 mL Per Tube 6 X Daily   potassium chloride  40 mEq Per Tube BID   sodium chloride flush  10-40 mL Intracatheter Q12H   Continuous Infusions:  ceFEPime (MAXIPIME) IV 2 g (08/17/20 0942)    lactated ringers     metronidazole 500 mg (08/17/20 0537)   vancomycin 750 mg (08/17/20 0540)     LOS: 2 days    Time spent: 25 minutes    Barb Merino, MD [facility] Pager [phone]

## 2020-08-18 ENCOUNTER — Inpatient Hospital Stay (HOSPITAL_COMMUNITY): Payer: 59

## 2020-08-18 DIAGNOSIS — J9601 Acute respiratory failure with hypoxia: Secondary | ICD-10-CM | POA: Diagnosis not present

## 2020-08-18 DIAGNOSIS — J189 Pneumonia, unspecified organism: Secondary | ICD-10-CM | POA: Diagnosis not present

## 2020-08-18 DIAGNOSIS — A419 Sepsis, unspecified organism: Secondary | ICD-10-CM | POA: Diagnosis not present

## 2020-08-18 LAB — COMPREHENSIVE METABOLIC PANEL
ALT: 95 U/L — ABNORMAL HIGH (ref 0–44)
AST: 69 U/L — ABNORMAL HIGH (ref 15–41)
Albumin: 1.7 g/dL — ABNORMAL LOW (ref 3.5–5.0)
Alkaline Phosphatase: 202 U/L — ABNORMAL HIGH (ref 38–126)
Anion gap: 9 (ref 5–15)
BUN: 19 mg/dL (ref 6–20)
CO2: 27 mmol/L (ref 22–32)
Calcium: 8.8 mg/dL — ABNORMAL LOW (ref 8.9–10.3)
Chloride: 102 mmol/L (ref 98–111)
Creatinine, Ser: 0.55 mg/dL — ABNORMAL LOW (ref 0.61–1.24)
GFR, Estimated: >60 mL/min (ref >60)
Glucose, Bld: 90 mg/dL (ref 70–99)
Potassium: 3.8 mmol/L (ref 3.5–5.1)
Sodium: 138 mmol/L (ref 135–145)
Total Bilirubin: 0.7 mg/dL (ref 0.3–1.2)
Total Protein: 5.6 g/dL — ABNORMAL LOW (ref 6.5–8.1)

## 2020-08-18 LAB — CBC
HCT: 27.7 % — ABNORMAL LOW (ref 39.0–52.0)
Hemoglobin: 8.6 g/dL — ABNORMAL LOW (ref 13.0–17.0)
MCH: 29.8 pg (ref 26.0–34.0)
MCHC: 31 g/dL (ref 30.0–36.0)
MCV: 95.8 fL (ref 80.0–100.0)
Platelets: 481 10*3/uL — ABNORMAL HIGH (ref 150–400)
RBC: 2.89 MIL/uL — ABNORMAL LOW (ref 4.22–5.81)
RDW: 15.8 % — ABNORMAL HIGH (ref 11.5–15.5)
WBC: 12.6 10*3/uL — ABNORMAL HIGH (ref 4.0–10.5)
nRBC: 0 % (ref 0.0–0.2)

## 2020-08-18 NOTE — Progress Notes (Addendum)
RT called due to patient not being able to breath. Upon arrival, patient SATs were in the 60's and patient was grayish-blue. Attempted to suction patient, meeting resistance with the catheter, once able to pass catheter patient was suctioned obtaining copious amounts of thin, yellowish-tan secretions. However, sticky tan secretions were on the catheter, being we had to get through the mucous plug to suction. Patient had plugged off. Patient was lavaged, bagged, and suctioned again obtaining a copious amount of thin yellowish secretions. Catheter still hard to pass, however, patients color returned and SATs were in the 90s. RN and Rapid RN at bedside. MD to be notified for a trach change to have a clean airway and have inner cannulas that are removable. RN aware.

## 2020-08-18 NOTE — Progress Notes (Signed)
Patient refused CPT at this time. Patient states we will do later today.

## 2020-08-18 NOTE — Progress Notes (Signed)
CPT held at this time per patient request due to the event that just occurred. Patient very exhausted at this time. Patient encouraged to take the CPT at next scheduled time and explained the importance.

## 2020-08-18 NOTE — Procedures (Addendum)
Tracheostomy Change Note  Patient Details:   Name: Erik [name] DOB: 04-Nov-1965 MRN: [id number]    Airway Documentation:     Evaluation  O2 sats: stable throughout Complications: No apparent complications Patient did tolerate procedure well. Bilateral Breath Sounds: Rhonchi, Diminished   Patients trach changed out for same size trach (#4 cuffless shiley flex) with inner cannula. Inner cannula in place. Color change was noted on EZ cap and bilateral breath sounds noted. Patient resting comfortably.   Myrtie Neither 08/18/2020, 5:08 PM

## 2020-08-18 NOTE — Progress Notes (Signed)
PROGRESS NOTE    Erik [name]  RSW:[id number] DOB: September 26, 1965 DOA: 08/14/2020 PCP: [name], NP    Brief Narrative:  55 year old male with history of laryngeal carcinoma status post radiation and also requiring a trach collar, G-tube for feeds who presented with swelling in his neck.  His ENT did not notice any changes but his oncologist placed him on prednisone for the swelling.  He took it for 5 days starting on July 5 but he started to get more short of breath with coughing after few days leading up to his admission.  In the emergency department his temperature was 101.1 Fahrenheit and heart rate in the 140s.  White blood cell count is 20.3 with a checks x-ray showing right upper lobe infiltrate.  COVID test negative.  Antibiotics were started.  Assessment & Plan:   Principal Problem:   Sepsis ([place]) Active Problems:   Laryngeal cancer ([place])   G tube feedings ([place])   CAP (community acquired pneumonia)   Acute respiratory failure ([place])  Sepsis secondary to community-acquired pneumonia with hypoxemia:  Patient was treated with vancomycin and cefepime and Flagyl.  Still has episodic fever.   MRSA swab negative.  Discontinue vancomycin today. Repeat chest x-ray with persistent right upper lobe consolidation.  With history of cancer, will check CT scan of the chest to rule out any postobstructive pneumonia. Blood cultures negative so far.  Sputum cultures negative so far. Continue aggressive chest physiotherapy.  Oxygen to keep saturation more than 90%.  Hypokalemia: Replaced with improvement.    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  Is stable.  Abnormal liver function test: Gradually improving.  Unknown whether he suffered shock liver.  Blood pressure is stabilized now.  LFTs stable.   DVT prophylaxis: enoxaparin (LOVENOX) injection 40 mg Start: 08/14/20 1545   Code Status: Full code Family Communication: Wife on the  phone. Disposition Plan: Status is: Inpatient  Remains inpatient appropriate because:IV treatments appropriate due to intensity of illness or inability to take PO and Inpatient level of care appropriate due to severity of illness  Dispo: The patient is from: Home              Anticipated d/c is to: Home              Patient currently is not medically stable to d/c.   Difficult to place patient No         Consultants:  None  Procedures:  None  Antimicrobials:  Rocephin azithromycin 7/16--- 7/18 Vancomycin, cefepime and Flagyl 7/18--   Subjective: Patient seen and examined.  Feels better after suctioning.  Eager to go home.  Temperature 101 overnight.  Repeat chest x-ray with persistent right upper lobe consolidation. Objective: Vitals:   08/18/20 0259 08/18/20 0504 08/18/20 0814 08/18/20 1223  BP:  112/76    Pulse: (!) 111 (!) 108 (!) 107   Resp: 20 (!) 22 (!) 27   Temp:  99.9 F (37.7 C)    TempSrc:  Oral    SpO2: 96% 95% 99% 96%  Weight:      Height:        Intake/Output Summary (Last 24 hours) at 08/18/2020 1257 Last data filed at 08/18/2020 0900 Gross per 24 hour  Intake 1901.1 ml  Output 1850 ml  Net 51.1 ml   Filed Weights   08/14/20 1214  Weight: 54.4 kg    Examination:  General exam: Appears comfortable at rest. Respiratory system:  Bilateral clear with some conducted upper airway sounds. Tracheostomy with thin yellow secretions. Cardiovascular system: S1 & S2 heard, RRR.  Gastrointestinal system: Abdomen is nondistended, soft and nontender. No organomegaly or masses felt. Normal bowel sounds heard. G-tube in place. Central nervous system: Alert and oriented. No focal neurological deficits. Extremities: Symmetric 5 x 5 power. Skin: No rashes, lesions or ulcers    Data Reviewed: I have personally reviewed following labs and imaging studies  CBC: Recent Labs  Lab 08/14/20 1206 08/15/20 0240 08/16/20 0350 08/17/20 0408 08/18/20 0221  WBC  20.3* 12.5* 13.8* 13.4* 12.6*  NEUTROABS 18.7* 11.8*  --   --   --   HGB 11.2* 8.4* 8.2* 8.5* 8.6*  HCT 34.9* 26.6* 26.4* 27.1* 27.7*  MCV 93.6 94.3 94.3 96.1 95.8  PLT 498* 393 426* 441* 539*   Basic Metabolic Panel: Recent Labs  Lab 08/14/20 1206 08/15/20 0240 08/16/20 0350 08/17/20 0408 08/18/20 0221  NA 137 137 137 136 138  K 4.3 3.3* 3.3* 3.8 3.8  CL 94* 99 100 103 102  CO2 28 28 29 27 27   GLUCOSE 101* 93 92 102* 90  BUN 53* 40* 23* 20 19  CREATININE 1.03 0.80 0.57* 0.52* 0.55*  CALCIUM 9.1 8.2* 8.6* 8.5* 8.8*   GFR: Estimated Creatinine Clearance: 80.3 mL/min (A) (by C-G formula based on SCr of 0.55 mg/dL (L)). Liver Function Tests: Recent Labs  Lab 08/14/20 1206 08/14/20 1810 08/15/20 0240 08/18/20 0221  AST 52* 40 50* 69*  ALT 158* 124* 91* 95*  ALKPHOS 295* 233* 208* 202*  BILITOT 2.6* 2.1* 1.7* 0.7  PROT 6.6 6.8 5.4* 5.6*  ALBUMIN 2.3* 2.3* 1.9* 1.7*   No results for input(s): LIPASE, AMYLASE in the last 168 hours. No results for input(s): AMMONIA in the last 168 hours. Coagulation Profile: Recent Labs  Lab 08/14/20 1206  INR 1.2   Cardiac Enzymes: No results for input(s): CKTOTAL, CKMB, CKMBINDEX, TROPONINI in the last 168 hours. BNP (last 3 results) No results for input(s): PROBNP in the last 8760 hours. HbA1C: No results for input(s): HGBA1C in the last 72 hours. CBG: Recent Labs  Lab 08/14/20 2154  GLUCAP 156*   Lipid Profile: No results for input(s): CHOL, HDL, LDLCALC, TRIG, CHOLHDL, LDLDIRECT in the last 72 hours. Thyroid Function Tests: No results for input(s): TSH, T4TOTAL, FREET4, T3FREE, THYROIDAB in the last 72 hours. Anemia Panel: No results for input(s): VITAMINB12, FOLATE, FERRITIN, TIBC, IRON, RETICCTPCT in the last 72 hours. Sepsis Labs: Recent Labs  Lab 08/14/20 1206  LATICACIDVEN 1.8    Recent Results (from the past 240 hour(s))  Blood culture (routine single)     Status: None (Preliminary result)   Collection  Time: 08/14/20 11:21 AM   Specimen: BLOOD  Result Value Ref Range Status   Specimen Description   Final    BLOOD RIGHT ANTECUBITAL Performed at [facility] [address], [place], [place] [address]    Special Requests   Final    BOTTLES DRAWN AEROBIC AND ANAEROBIC Blood Culture results may not be optimal due to an excessive volume of blood received in culture bottles Performed at [facility] [address] [place], [place] [address]    Culture   Final    NO GROWTH 4 DAYS Performed at [facility], [place] [address], [place], [place] [address]    Report Status PENDING  Incomplete  Culture, blood (single)     Status: None (Preliminary result)  Collection Time: 08/14/20 12:29 PM   Specimen: BLOOD  Result Value Ref Range Status   Specimen Description   Final    BLOOD BLOOD RIGHT FOREARM Performed at [facility] [address], [place], [place] [address]    Special Requests   Final    BOTTLES DRAWN AEROBIC AND ANAEROBIC Blood Culture adequate volume Performed at [facility] [address] [place], [place] [address]    Culture   Final    NO GROWTH 4 DAYS Performed at [facility], [place] [address] [place], [place] [address]    Report Status PENDING  Incomplete  Resp Panel by RT-PCR (Flu A&B, Covid) Nasopharyngeal Swab     Status: None   Collection Time: 08/14/20  2:23 PM   Specimen: Nasopharyngeal Swab; Nasopharyngeal(NP) swabs in vial transport medium  Result Value Ref Range Status   SARS Coronavirus 2 by RT PCR NEGATIVE NEGATIVE Final    Comment: (NOTE) SARS-CoV-2 target nucleic acids are NOT DETECTED.  The SARS-CoV-2 RNA is generally detectable in upper respiratory specimens during the acute phase of infection. The lowest concentration of SARS-CoV-2 viral copies this assay can detect is 138 copies/mL. A negative result does not preclude SARS-Cov-2 infection and should not be used  as the sole basis for treatment or other patient management decisions. A negative result may occur with  improper specimen collection/handling, submission of specimen other than nasopharyngeal swab, presence of viral mutation(s) within the areas targeted by this assay, and inadequate number of viral copies(<138 copies/mL). A negative result must be combined with clinical observations, patient history, and epidemiological information. The expected result is Negative.  Fact Sheet for Patients:  EntrepreneurPulse.com.au  Fact Sheet for Healthcare Providers:  IncredibleEmployment.be  This test is no t yet approved or cleared by the [place] FDA and  has been authorized for detection and/or diagnosis of SARS-CoV-2 by FDA under an Emergency Use Authorization (EUA). This EUA will remain  in effect (meaning this test can be used) for the duration of the COVID-19 declaration under Section 564(b)(1) of the Act, 21 U.S.C.section 360bbb-3(b)(1), unless the authorization is terminated  or revoked sooner.       Influenza A by PCR NEGATIVE NEGATIVE Final   Influenza B by PCR NEGATIVE NEGATIVE Final    Comment: (NOTE) The Xpert Xpress SARS-CoV-2/FLU/RSV plus assay is intended as an aid in the diagnosis of influenza from Nasopharyngeal swab specimens and should not be used as a sole basis for treatment. Nasal washings and aspirates are unacceptable for Xpert Xpress SARS-CoV-2/FLU/RSV testing.  Fact Sheet for Patients: EntrepreneurPulse.com.au  Fact Sheet for Healthcare Providers: IncredibleEmployment.be  This test is not yet approved or cleared by the [place] FDA and has been authorized for detection and/or diagnosis of SARS-CoV-2 by FDA under an Emergency Use Authorization (EUA). This EUA will remain in effect (meaning this test can be used) for the duration of the COVID-19 declaration under Section 564(b)(1)  of the Act, 21 U.S.C. section 360bbb-3(b)(1), unless the authorization is terminated or revoked.  Performed at [facility], [place] [address] [place], [place] [address]   Urine Culture     Status: None   Collection Time: 08/15/20 12:51 PM   Specimen: In/Out Cath Urine  Result Value Ref Range Status   Specimen Description   Final    IN/OUT CATH URINE Performed at [facility] [address] [place], [place] [address]    Special Requests   Final  NONE Performed at [facility], [place] [address] [place], [place] [address]    Culture   Final    NO GROWTH Performed at [facility], [place] [address], [place], [place] [address]    Report Status 08/16/2020 FINAL  Final  MRSA Next Gen by PCR, Nasal     Status: None   Collection Time: 08/16/20  9:01 PM   Specimen: Nasal Mucosa; Nasal Swab  Result Value Ref Range Status   MRSA by PCR Next Gen NOT DETECTED NOT DETECTED Final    Comment: (NOTE) The GeneXpert MRSA Assay (FDA approved for NASAL specimens only), is one component of a comprehensive MRSA colonization surveillance program. It is not intended to diagnose MRSA infection nor to guide or monitor treatment for MRSA infections. Test performance is not FDA approved in patients less than 13 years old. Performed at [facility], [place] [address] [place], [place] [address]   Culture, Respiratory w Gram Stain     Status: None (Preliminary result)   Collection Time: 08/17/20  9:25 AM   Specimen: Tracheal Aspirate  Result Value Ref Range Status   Specimen Description   Final    TRACHEAL ASPIRATE Performed at [facility] [address], [place], [place] [address]    Special Requests   Final    NONE Performed at [facility], [place] [address] [place], [place] [address]    Gram Stain   Final    RARE WBC PRESENT,BOTH PMN AND MONONUCLEAR RARE GRAM POSITIVE COCCI IN  PAIRS    Culture   Final    RARE STAPHYLOCOCCUS AUREUS RARE PSEUDOMONAS AERUGINOSA SUSCEPTIBILITIES TO FOLLOW Performed at [facility], [place] [address], [place],  [address]    Report Status PENDING  Incomplete         Radiology Studies: DG CHEST PORT 1 VIEW  Result Date: 08/18/2020 CLINICAL DATA:  Pneumonia. EXAM: PORTABLE CHEST 1 VIEW COMPARISON:  08/14/2020. FINDINGS: Catheter tracheostomy tube in stable position. Heart size stable. Persistent right upper lung dense infiltrate, no change from prior exam. No pleural effusion or pneumothorax. IMPRESSION: 1.  PowerPort catheter tracheostomy tube in stable position. 2. Persistent dense right upper lung infiltrate consistent with pneumonia. No interim change. Electronically Signed   By: Marcello Moores  Register   On: 08/18/2020 06:06        Scheduled Meds:  Chlorhexidine Gluconate Cloth  6 each Topical Daily   enoxaparin (LOVENOX) injection  40 mg Subcutaneous Q24H   feeding supplement (OSMOLITE 1.5 CAL)  237 mL Per Tube 6 X Daily   sodium chloride flush  10-40 mL Intracatheter Q12H   Continuous Infusions:  ceFEPime (MAXIPIME) IV 2 g (08/18/20 0946)   lactated ringers     metronidazole 500 mg (08/18/20 0339)     LOS: 3 days    Time spent: 25 minutes    Barb Merino, MD [facility] Pager [phone]

## 2020-08-19 ENCOUNTER — Inpatient Hospital Stay (HOSPITAL_COMMUNITY): Payer: 59

## 2020-08-19 DIAGNOSIS — J189 Pneumonia, unspecified organism: Secondary | ICD-10-CM | POA: Diagnosis not present

## 2020-08-19 DIAGNOSIS — A419 Sepsis, unspecified organism: Secondary | ICD-10-CM | POA: Diagnosis not present

## 2020-08-19 DIAGNOSIS — J9601 Acute respiratory failure with hypoxia: Secondary | ICD-10-CM | POA: Diagnosis not present

## 2020-08-19 LAB — CULTURE, RESPIRATORY W GRAM STAIN

## 2020-08-19 LAB — CULTURE, BLOOD (SINGLE)
Culture: NO GROWTH
Culture: NO GROWTH
Special Requests: ADEQUATE

## 2020-08-19 LAB — COMPREHENSIVE METABOLIC PANEL
ALT: 88 U/L — ABNORMAL HIGH (ref 0–44)
AST: 70 U/L — ABNORMAL HIGH (ref 15–41)
Albumin: 1.8 g/dL — ABNORMAL LOW (ref 3.5–5.0)
Alkaline Phosphatase: 178 U/L — ABNORMAL HIGH (ref 38–126)
Anion gap: 8 (ref 5–15)
BUN: 23 mg/dL — ABNORMAL HIGH (ref 6–20)
CO2: 28 mmol/L (ref 22–32)
Calcium: 8.6 mg/dL — ABNORMAL LOW (ref 8.9–10.3)
Chloride: 104 mmol/L (ref 98–111)
Creatinine, Ser: 0.49 mg/dL — ABNORMAL LOW (ref 0.61–1.24)
GFR, Estimated: >60 mL/min (ref >60)
Glucose, Bld: 95 mg/dL (ref 70–99)
Potassium: 3.4 mmol/L — ABNORMAL LOW (ref 3.5–5.1)
Sodium: 140 mmol/L (ref 135–145)
Total Bilirubin: 0.6 mg/dL (ref 0.3–1.2)
Total Protein: 5.5 g/dL — ABNORMAL LOW (ref 6.5–8.1)

## 2020-08-19 LAB — CBC
HCT: 26.5 % — ABNORMAL LOW (ref 39.0–52.0)
Hemoglobin: 8.2 g/dL — ABNORMAL LOW (ref 13.0–17.0)
MCH: 29.7 pg (ref 26.0–34.0)
MCHC: 30.9 g/dL (ref 30.0–36.0)
MCV: 96 fL (ref 80.0–100.0)
Platelets: 495 10*3/uL — ABNORMAL HIGH (ref 150–400)
RBC: 2.76 MIL/uL — ABNORMAL LOW (ref 4.22–5.81)
RDW: 15.9 % — ABNORMAL HIGH (ref 11.5–15.5)
WBC: 10.2 10*3/uL (ref 4.0–10.5)
nRBC: 0 % (ref 0.0–0.2)

## 2020-08-19 LAB — IRON AND TIBC
Iron: 15 ug/dL — ABNORMAL LOW (ref 45–182)
Saturation Ratios: 9 % — ABNORMAL LOW (ref 17.9–39.5)
TIBC: 174 ug/dL — ABNORMAL LOW (ref 250–450)
UIBC: 159 ug/dL

## 2020-08-19 LAB — VITAMIN B12: Vitamin B-12: 1223 pg/mL — ABNORMAL HIGH (ref 180–914)

## 2020-08-19 LAB — FERRITIN: Ferritin: 1356 ng/mL — ABNORMAL HIGH (ref 24–336)

## 2020-08-19 LAB — FOLATE: Folate: 15.2 ng/mL (ref >5.9)

## 2020-08-19 MED ORDER — SODIUM CHLORIDE 0.9 % IV SOLN
INTRAVENOUS | Status: DC | PRN
  Administered 2020-08-19 – 2020-08-21 (×2): 250 mL via INTRAVENOUS

## 2020-08-19 MED ORDER — POTASSIUM CHLORIDE 20 MEQ PO PACK
40.0000 meq | PACK | Freq: Two times a day (BID) | ORAL | Status: AC
  Administered 2020-08-19 – 2020-08-20 (×4): 40 meq
  Filled 2020-08-19 (×4): qty 2

## 2020-08-19 MED ORDER — POTASSIUM CHLORIDE 20 MEQ PO PACK: 40.0000 meq | PACK | Freq: Two times a day (BID) | ORAL | Status: DC

## 2020-08-19 NOTE — Progress Notes (Signed)
Chaplain met Erik [name]'s wife, Erik [name], in the elevator and she expressed her hope for her husband's health and condition.  Chaplain was able to check in with Erik [name] who had communicated with his wife about the possibilities of going home.  They were both excited about that.  Erik [name] shared about Erik [name]'s recent healthcare journey of finishing cancer treatment and then getting pneumonia.  Chaplain offered listening, support, and blessing of rest for Erik [name].    Chaplain is available to follow-up.    08/19/20 1400  Clinical Encounter Type  Visited With Patient and family together  Visit Type Initial

## 2020-08-19 NOTE — Progress Notes (Signed)
RT NOTE:  Pt refused CPT at this time.

## 2020-08-19 NOTE — Progress Notes (Signed)
PROGRESS NOTE    Erik [name]  EGB:[id number] DOB: 05/26/1965 DOA: 08/14/2020 PCP: [name], NP    Brief Narrative:  54 year old male with history of laryngeal carcinoma status surgery and radiation.  Patient has trach collar and G-tube.  Recently treated with steroids for neck swelling.  Started having temperature and shortness of breath so came to the hospital.   Chest x-ray consistent with right upper lobe pneumonia, new oxygen requirement so admitted to the hospital.  On presentation, temperature was 101.1, WBC count 20.3, heart rate was 140. COVID test negative.  Antibiotics were started.  Assessment & Plan:   Principal Problem:   Sepsis ([place]) Active Problems:   Laryngeal cancer ([place])   G tube feedings ([place])   CAP (community acquired pneumonia)   Acute respiratory failure ([place])  Sepsis secondary to community-acquired pneumonia with hypoxemia:  Patient was treated with vancomycin and cefepime and Flagyl.  Some clinical improvement today.   MRSA swab negative.  Blood cultures and sputum cultures no growth so far.   Repeat chest x-ray with persistent right upper lobe consolidation which is probably secondary to mucoid plug.  Aggressive chest physiotherapy.   During history of neck cancer, will check CT scan of the chest today to rule out any obstructive bronchial lesions.  Oxygen to keep saturation more than 90%.  Hypokalemia: Replace further.  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  Is stable.  Acute on chronic anemia: Unknown cause.  will send for iron studies and check FOBT.  Abnormal liver function test: Gradually improving.  Unknown whether he suffered shock liver.  Blood pressure is stabilized now.  LFTs stable.   DVT prophylaxis: enoxaparin (LOVENOX) injection 40 mg Start: 08/14/20 1545   Code Status: Full code Family Communication: Wife on the phone 7/20. Disposition Plan: Status is: Inpatient  Remains  inpatient appropriate because:IV treatments appropriate due to intensity of illness or inability to take PO and Inpatient level of care appropriate due to severity of illness  Dispo: The patient is from: Home              Anticipated d/c is to: Home              Patient currently is not medically stable to d/c.   Difficult to place patient No         Consultants:  None  Procedures:  None  Antimicrobials:  Rocephin azithromycin 7/16--- 7/18 Vancomycin 7/18-7/20  cefepime and Flagyl 7/18----   Subjective: Patient seen and examined.  Had a lot of mucoid plug on his tracheostomy cannula yesterday and needed to be changed.  Not feel somehow better. Denies any shortness of breath.  Temperature maximum 100.4 overnight.  WBC count normalized. Eager to go home.  Still using 5 L of oxygen.   Objective: Vitals:   08/18/20 2126 08/18/20 2350 08/19/20 0421 08/19/20 1021  BP: 101/64 107/72 120/82 104/65  Pulse: (!) 115 (!) 101 (!) 101 96  Resp: (!) 25 (!) 25 18 18   Temp: (!) 100.4 F (38 C) 98.5 F (36.9 C) 98 F (36.7 C) 98.4 F (36.9 C)  TempSrc: Oral Oral Oral Oral  SpO2: 91% 94% 98% 100%  Weight:      Height:        Intake/Output Summary (Last 24 hours) at 08/19/2020 1207 Last data filed at 08/19/2020 1022 Gross per 24 hour  Intake 735.08 ml  Output 1150 ml  Net -414.92 ml   Danley Danker  Weights   08/14/20 1214  Weight: 54.4 kg    Examination:  General exam: Anxious but appears comfortable. Respiratory system: Mostly conducted airway sounds. Cardiovascular system: S1 & S2 heard, RRR.  Gastrointestinal system: Abdomen is nondistended, soft and nontender. No organomegaly or masses felt. Normal bowel sounds heard. G-tube in place. Central nervous system: Alert and oriented. No focal neurological deficits. Extremities: Symmetric 5 x 5 power. Skin: No rashes, lesions or ulcers    Data Reviewed: I have personally reviewed following labs and imaging  studies  CBC: Recent Labs  Lab 08/14/20 1206 08/15/20 0240 08/16/20 0350 08/17/20 0408 08/18/20 0221 08/19/20 0350  WBC 20.3* 12.5* 13.8* 13.4* 12.6* 10.2  NEUTROABS 18.7* 11.8*  --   --   --   --   HGB 11.2* 8.4* 8.2* 8.5* 8.6* 8.2*  HCT 34.9* 26.6* 26.4* 27.1* 27.7* 26.5*  MCV 93.6 94.3 94.3 96.1 95.8 96.0  PLT 498* 393 426* 441* 481* 638*   Basic Metabolic Panel: Recent Labs  Lab 08/15/20 0240 08/16/20 0350 08/17/20 0408 08/18/20 0221 08/19/20 0350  NA 137 137 136 138 140  K 3.3* 3.3* 3.8 3.8 3.4*  CL 99 100 103 102 104  CO2 28 29 27 27 28   GLUCOSE 93 92 102* 90 95  BUN 40* 23* 20 19 23*  CREATININE 0.80 0.57* 0.52* 0.55* 0.49*  CALCIUM 8.2* 8.6* 8.5* 8.8* 8.6*   GFR: Estimated Creatinine Clearance: 80.3 mL/min (A) (by C-G formula based on SCr of 0.49 mg/dL (L)). Liver Function Tests: Recent Labs  Lab 08/14/20 1206 08/14/20 1810 08/15/20 0240 08/18/20 0221 08/19/20 0350  AST 52* 40 50* 69* 70*  ALT 158* 124* 91* 95* 88*  ALKPHOS 295* 233* 208* 202* 178*  BILITOT 2.6* 2.1* 1.7* 0.7 0.6  PROT 6.6 6.8 5.4* 5.6* 5.5*  ALBUMIN 2.3* 2.3* 1.9* 1.7* 1.8*   No results for input(s): LIPASE, AMYLASE in the last 168 hours. No results for input(s): AMMONIA in the last 168 hours. Coagulation Profile: Recent Labs  Lab 08/14/20 1206  INR 1.2   Cardiac Enzymes: No results for input(s): CKTOTAL, CKMB, CKMBINDEX, TROPONINI in the last 168 hours. BNP (last 3 results) No results for input(s): PROBNP in the last 8760 hours. HbA1C: No results for input(s): HGBA1C in the last 72 hours. CBG: Recent Labs  Lab 08/14/20 2154  GLUCAP 156*   Lipid Profile: No results for input(s): CHOL, HDL, LDLCALC, TRIG, CHOLHDL, LDLDIRECT in the last 72 hours. Thyroid Function Tests: No results for input(s): TSH, T4TOTAL, FREET4, T3FREE, THYROIDAB in the last 72 hours. Anemia Panel: No results for input(s): VITAMINB12, FOLATE, FERRITIN, TIBC, IRON, RETICCTPCT in the last 72  hours. Sepsis Labs: Recent Labs  Lab 08/14/20 1206  LATICACIDVEN 1.8    Recent Results (from the past 240 hour(s))  Blood culture (routine single)     Status: None (Preliminary result)   Collection Time: 08/14/20 11:21 AM   Specimen: BLOOD  Result Value Ref Range Status   Specimen Description   Final    BLOOD RIGHT ANTECUBITAL Performed at [facility] [address], [place], [place] [address]    Special Requests   Final    BOTTLES DRAWN AEROBIC AND ANAEROBIC Blood Culture results may not be optimal due to an excessive volume of blood received in culture bottles Performed at [facility] [address], [place], [place] [address]    Culture   Final    NO GROWTH 4 DAYS Performed at [facility], [place] [address] [place], [place] [address]    Report Status PENDING  Incomplete  Culture, blood (single)     Status: None (Preliminary result)   Collection Time: 08/14/20 12:29 PM   Specimen: BLOOD  Result Value Ref Range Status   Specimen Description   Final    BLOOD BLOOD RIGHT FOREARM Performed at [facility] [address] [place] [address] [place], [place] [address]    Special Requests   Final    BOTTLES DRAWN AEROBIC AND ANAEROBIC Blood Culture adequate volume Performed at [facility] [address] [place] [address], [place], [place] [address]    Culture   Final    NO GROWTH 4 DAYS Performed at [facility], [place] [address] [place], [place] [address]    Report Status PENDING  Incomplete  Resp Panel by RT-PCR (Flu A&B, Covid) Nasopharyngeal Swab     Status: None   Collection Time: 08/14/20  2:23 PM   Specimen: Nasopharyngeal Swab; Nasopharyngeal(NP) swabs in vial transport medium  Result Value Ref Range Status   SARS Coronavirus 2 by RT PCR NEGATIVE NEGATIVE Final    Comment: (NOTE) SARS-CoV-2 target nucleic acids are NOT DETECTED.  The SARS-CoV-2 RNA is generally detectable in upper respiratory specimens  during the acute phase of infection. The lowest concentration of SARS-CoV-2 viral copies this assay can detect is 138 copies/mL. A negative result does not preclude SARS-Cov-2 infection and should not be used as the sole basis for treatment or other patient management decisions. A negative result may occur with  improper specimen collection/handling, submission of specimen other than nasopharyngeal swab, presence of viral mutation(s) within the areas targeted by this assay, and inadequate number of viral copies(<138 copies/mL). A negative result must be combined with clinical observations, patient history, and epidemiological information. The expected result is Negative.  Fact Sheet for Patients:  EntrepreneurPulse.com.au  Fact Sheet for Healthcare Providers:  IncredibleEmployment.be  This test is no t yet approved or cleared by the [place] FDA and  has been authorized for detection and/or diagnosis of SARS-CoV-2 by FDA under an Emergency Use Authorization (EUA). This EUA will remain  in effect (meaning this test can be used) for the duration of the COVID-19 declaration under Section 564(b)(1) of the Act, 21 U.S.C.section 360bbb-3(b)(1), unless the authorization is terminated  or revoked sooner.       Influenza A by PCR NEGATIVE NEGATIVE Final   Influenza B by PCR NEGATIVE NEGATIVE Final    Comment: (NOTE) The Xpert Xpress SARS-CoV-2/FLU/RSV plus assay is intended as an aid in the diagnosis of influenza from Nasopharyngeal swab specimens and should not be used as a sole basis for treatment. Nasal washings and aspirates are unacceptable for Xpert Xpress SARS-CoV-2/FLU/RSV testing.  Fact Sheet for Patients: EntrepreneurPulse.com.au  Fact Sheet for Healthcare Providers: IncredibleEmployment.be  This test is not yet approved or cleared by the [place] FDA and has been authorized for detection  and/or diagnosis of SARS-CoV-2 by FDA under an Emergency Use Authorization (EUA). This EUA will remain in effect (meaning this test can be used) for the duration of the COVID-19 declaration under Section 564(b)(1) of the Act, 21 U.S.C. section 360bbb-3(b)(1), unless the authorization is terminated or revoked.  Performed at [facility], [place] [address], [place],  [address]   Urine Culture     Status: None   Collection Time: 08/15/20 12:51 PM   Specimen: In/Out Cath Urine  Result Value Ref Range Status   Specimen Description  Final    IN/OUT CATH URINE Performed at [facility], [place] [address] [place], [place] [address]    Special Requests   Final    NONE Performed at [facility], [place] [address] [place], [place] [address]    Culture   Final    NO GROWTH Performed at [facility], [place] [address] [place], [place] [address]    Report Status 08/16/2020 FINAL  Final  MRSA Next Gen by PCR, Nasal     Status: None   Collection Time: 08/16/20  9:01 PM   Specimen: Nasal Mucosa; Nasal Swab  Result Value Ref Range Status   MRSA by PCR Next Gen NOT DETECTED NOT DETECTED Final    Comment: (NOTE) The GeneXpert MRSA Assay (FDA approved for NASAL specimens only), is one component of a comprehensive MRSA colonization surveillance program. It is not intended to diagnose MRSA infection nor to guide or monitor treatment for MRSA infections. Test performance is not FDA approved in patients less than 11 years old. Performed at [facility], [place] [address] [place], [place] [address]   Culture, Respiratory w Gram Stain     Status: None (Preliminary result)   Collection Time: 08/17/20  9:25 AM   Specimen: Tracheal Aspirate  Result Value Ref Range Status   Specimen Description   Final    TRACHEAL ASPIRATE Performed at [facility] [address] [place], [place] [address]    Special  Requests   Final    NONE Performed at [facility], [place] [address] [place], [place] [address]    Gram Stain   Final    RARE WBC PRESENT,BOTH PMN AND MONONUCLEAR RARE GRAM POSITIVE COCCI IN PAIRS    Culture   Final    RARE STAPHYLOCOCCUS AUREUS RARE PSEUDOMONAS AERUGINOSA SUSCEPTIBILITIES TO FOLLOW Performed at [facility], [place] [address] [place], [place] [address]    Report Status PENDING  Incomplete         Radiology Studies: DG CHEST PORT 1 VIEW  Result Date: 08/18/2020 CLINICAL DATA:  Pneumonia. EXAM: PORTABLE CHEST 1 VIEW COMPARISON:  08/14/2020. FINDINGS: Catheter tracheostomy tube in stable position. Heart size stable. Persistent right upper lung dense infiltrate, no change from prior exam. No pleural effusion or pneumothorax. IMPRESSION: 1.  PowerPort catheter tracheostomy tube in stable position. 2. Persistent dense right upper lung infiltrate consistent with pneumonia. No interim change. Electronically Signed   By: Marcello Moores  Register   On: 08/18/2020 06:06        Scheduled Meds:  Chlorhexidine Gluconate Cloth  6 each Topical Daily   enoxaparin (LOVENOX) injection  40 mg Subcutaneous Q24H   feeding supplement (OSMOLITE 1.5 CAL)  237 mL Per Tube 6 X Daily   potassium chloride  40 mEq Per Tube BID   sodium chloride flush  10-40 mL Intracatheter Q12H   Continuous Infusions:  sodium chloride 250 mL (08/19/20 0119)   ceFEPime (MAXIPIME) IV 2 g (08/19/20 1100)   lactated ringers     metronidazole 500 mg (08/19/20 0312)     LOS: 4 days    Time spent: 25 minutes    Barb Merino, MD [facility] Pager [phone]

## 2020-08-19 NOTE — Progress Notes (Signed)
RT NOTE:  Pt refused CPT this morning, RT asked x2 to verify with pt. Vitals are stable at this time, RT will continue to monitor.

## 2020-08-19 NOTE — Progress Notes (Signed)
Refuses cpt

## 2020-08-19 NOTE — Progress Notes (Signed)
RT NOTE  Pt refused CPT at this time.

## 2020-08-19 NOTE — Progress Notes (Signed)
Patient refused feeding supplement this morning. He informed me that at home he only does 4 bolus feedings. He wrote a note explaining this information and shared that his feeding times at home are 0800, 1200, 1600, and 2000.   Wynona Neat, RN

## 2020-08-20 DIAGNOSIS — J9601 Acute respiratory failure with hypoxia: Secondary | ICD-10-CM | POA: Diagnosis not present

## 2020-08-20 DIAGNOSIS — A419 Sepsis, unspecified organism: Secondary | ICD-10-CM | POA: Diagnosis not present

## 2020-08-20 DIAGNOSIS — J189 Pneumonia, unspecified organism: Secondary | ICD-10-CM | POA: Diagnosis not present

## 2020-08-20 LAB — CBC
HCT: 26.5 % — ABNORMAL LOW (ref 39.0–52.0)
Hemoglobin: 8.2 g/dL — ABNORMAL LOW (ref 13.0–17.0)
MCH: 30 pg (ref 26.0–34.0)
MCHC: 30.9 g/dL (ref 30.0–36.0)
MCV: 97.1 fL (ref 80.0–100.0)
Platelets: 570 10*3/uL — ABNORMAL HIGH (ref 150–400)
RBC: 2.73 MIL/uL — ABNORMAL LOW (ref 4.22–5.81)
RDW: 15.9 % — ABNORMAL HIGH (ref 11.5–15.5)
WBC: 10.5 10*3/uL (ref 4.0–10.5)
nRBC: 0 % (ref 0.0–0.2)

## 2020-08-20 LAB — COMPREHENSIVE METABOLIC PANEL
ALT: 78 U/L — ABNORMAL HIGH (ref 0–44)
AST: 55 U/L — ABNORMAL HIGH (ref 15–41)
Albumin: 1.8 g/dL — ABNORMAL LOW (ref 3.5–5.0)
Alkaline Phosphatase: 166 U/L — ABNORMAL HIGH (ref 38–126)
Anion gap: 6 (ref 5–15)
BUN: 24 mg/dL — ABNORMAL HIGH (ref 6–20)
CO2: 26 mmol/L (ref 22–32)
Calcium: 8.7 mg/dL — ABNORMAL LOW (ref 8.9–10.3)
Chloride: 108 mmol/L (ref 98–111)
Creatinine, Ser: 0.49 mg/dL — ABNORMAL LOW (ref 0.61–1.24)
GFR, Estimated: >60 mL/min (ref >60)
Glucose, Bld: 95 mg/dL (ref 70–99)
Potassium: 4.1 mmol/L (ref 3.5–5.1)
Sodium: 140 mmol/L (ref 135–145)
Total Bilirubin: 0.5 mg/dL (ref 0.3–1.2)
Total Protein: 5.6 g/dL — ABNORMAL LOW (ref 6.5–8.1)

## 2020-08-20 MED ORDER — PROSOURCE TF PO LIQD
45.0000 mL | Freq: Three times a day (TID) | ORAL | Status: DC
  Administered 2020-08-20 – 2020-08-21 (×4): 45 mL
  Filled 2020-08-20 (×4): qty 45

## 2020-08-20 MED ORDER — FREE WATER
240.0000 mL | Freq: Four times a day (QID) | Status: DC
  Administered 2020-08-20 – 2020-08-21 (×5): 240 mL

## 2020-08-20 MED ORDER — OSMOLITE 1.5 CAL PO LIQD
355.0000 mL | Freq: Four times a day (QID) | ORAL | Status: DC
  Administered 2020-08-20 – 2020-08-21 (×4): 355 mL
  Filled 2020-08-20 (×5): qty 474

## 2020-08-20 NOTE — Progress Notes (Signed)
Initial Nutrition Assessment  DOCUMENTATION CODES:   Severe malnutrition in context of chronic illness, Underweight  INTERVENTION:  - provide 1.5 cartons (355 ml) Osmolite 1.5 QID, 45 ml Prosource TF TID, and 120 ml free water before and 120 ml free water after each TF bolus. - this regimen will provide 2250 kcal, 122 grams protein, and 2046 ml free water.   NUTRITION DIAGNOSIS:   Severe Malnutrition related to chronic illness, cancer and cancer related treatments as evidenced by severe fat depletion, severe muscle depletion.  GOAL:   Patient will meet greater than or equal to 90% of their needs  MONITOR:   TF tolerance, Labs, Weight trends  REASON FOR ASSESSMENT:   Consult Enteral/tube feeding initiation and management  ASSESSMENT:   55 year old male with medical history of laryngeal carcinoma s/p chemoradiation, s/p trahc and PEG, CAD, and GERD. He presented to the ED d/t fever and shortness of breath. CXR showed RUL PNA. CT chest showed necrotizing cavitary PNA.  Patient has been NPO since admission. He has trach and is on trach collar. PEG in place and order in place for 1 carton (237 ml) Osmolite 1.5 x6/day. This regimen provides 2130 kcal, 89 grams protein, and 1086 ml free water.  Patient laying in bed with significant other at bedside. They both assist in providing information. Patient is able to mouth words and write to communicate.   He boluses 1.5 cartons (~355 ml) Osmolite 1.5 QID (0800, 1200, 1600, 2000). He flushes PEG with 60 ml free water before and 60 ml free water after each TF bolus. He also does additional water while infusing TF boluses. Reports he has been educated in the past on the importance of adequate hydration. At home he takes sips of tea and water, no other PO intakes.   Weight on 7/16 was documented as 120 lb, which appears to be a stated weight. This is the highest weight recorded as of 04/28/20.  Patient reports that home TF regimen has been  what he has utilized since 03/2020. He denies any issues with PEG or with feedings/feeding regimen in the time PTA.   Labs reviewed; BUN: 24 mg/dl, creatinine: 0.49 mg/dl, Ca: 8.7 mg/dl, Alk Phos elevated, LFTs elevated. Medications reviewed; 40 mEq Klor-Con per PEG x2 doses 7/21 and x2 doses 7/22.    NUTRITION - FOCUSED PHYSICAL EXAM:  Flowsheet Row Most Recent Value  Orbital Region Moderate depletion  Upper Arm Region Severe depletion  Thoracic and Lumbar Region Unable to assess  Buccal Region Severe depletion  Temple Region Moderate depletion  Clavicle Bone Region Severe depletion  Clavicle and Acromion Bone Region Severe depletion  Scapular Bone Region Unable to assess  Dorsal Hand Moderate depletion  Patellar Region Severe depletion  Anterior Thigh Region Unable to assess  Posterior Calf Region Severe depletion  Edema (RD Assessment) None  Hair Reviewed  Eyes Reviewed  Mouth Reviewed  Skin Reviewed  Nails Reviewed       Diet Order:   Diet Order             Diet NPO time specified  Diet effective now                   EDUCATION NEEDS:   No education needs have been identified at this time  Skin:  Skin Assessment: Reviewed RN Assessment  Last BM:  7/22 (type 6 x1)  Height:   Ht Readings from Last 1 Encounters:  08/14/20 5' 9"  (1.753 m)    Weight:  Wt Readings from Last 1 Encounters:  08/14/20 54.4 kg      Estimated Nutritional Needs:  Kcal:  2100-2300 kcal Protein:  105-120 grams Fluid:  >/= 2.3 L/day     Jarome Matin, MS, RD, LDN, CNSC Inpatient Clinical Dietitian RD pager # available in [place]  After hours/weekend pager # available in [facility]

## 2020-08-20 NOTE — Progress Notes (Signed)
Patient refused 2100 and 0600 feeding supplement per tube.

## 2020-08-20 NOTE — Progress Notes (Signed)
PROGRESS NOTE    Erik [name]  [id number] DOB: [birth year] DOA: 08/14/2020 PCP: [name], NP    Brief Narrative:  55 year old male with history of laryngeal carcinoma status chemoradiation.  Patient has trach collar and G-tube.  Recently treated with steroids for neck swelling.  Started having temperature and shortness of breath so came to the hospital.   Chest x-ray consistent with right upper lobe pneumonia, new oxygen requirement so admitted to the hospital.  On presentation, temperature was 101.1, WBC count 20.3, heart rate was 140. COVID test negative.  Antibiotics were started. Remains poorly compliant for chest physiotherapy.  CT scan consistent with necrotizing cavitary pneumonia.  Sputum cultures with MSSA and Pseudomonas.  Assessment & Plan:   Principal Problem:   Sepsis ([place]) Active Problems:   Laryngeal cancer ([place])   G tube feedings ([place])   CAP (community acquired pneumonia)   Acute respiratory failure ([place])  Sepsis secondary to MSSA and Pseudomonas pneumonia: Patient was treated with vancomycin and cefepime and Flagyl.  With clinical improvement currently remains on cefepime and Flagyl.  WBC count normalized.  MRSA swab was negative. Blood cultures negative so far. Sputum cultures positive for MSSA and Pseudomonas, however they were done after many days on antibiotic therapy. CT scan consistent with cavitary pneumonia right upper lobe. Tracheal aspirate thick and mucoid. Patient was having difficulty tolerating chest physiotherapy, discussed about importance of it, will continue aggressive chest physiotherapy, suction and pulmonary toilet. Keep on oxygen to keep saturation more than 90%. We discussed with infectious disease, he may need prolonged oral antibiotic therapy.  Hypokalemia: Replaced and improved.  We will check magnesium and phosphorus with the morning labs.  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  Is  stable.  Acute on chronic anemia: Low iron.  FOBT pending.  Abnormal liver function test: Gradually improving.  Unknown whether he suffered shock liver.  Blood pressure is stabilized now.  LFTs stable.  Severe protein calorie malnutrition: Patient continues to have low albumin and poor feeding schedule. Will ask dietitian to review tube feeding.   DVT prophylaxis: enoxaparin (LOVENOX) injection 40 mg Start: 08/14/20 1545   Code Status: Full code Family Communication: None today. Disposition Plan: Status is: Inpatient  Remains inpatient appropriate because:IV treatments appropriate due to intensity of illness or inability to take PO and Inpatient level of care appropriate due to severity of illness  Dispo: The patient is from: Home              Anticipated d/c is to: Home              Patient currently is not medically stable to d/c.   Difficult to place patient No         Consultants:  None  Procedures:  None  Antimicrobials:  Rocephin azithromycin 7/16--- 7/18 Vancomycin 7/18-7/20  cefepime and Flagyl 7/18----   Subjective: Patient seen and examined.  No overnight events.  Temperature mostly normalizing. Tracheostomy occasionally comes out and clogged with thick mucoid sputum. He is tired of being in the hospital and does not agree to chest physiotherapy most of the time.  I convinced him to use it. With current copious mucoid secretions, will hold off on any speech therapy evaluation.  He may be a good candidate for repeated speech therapy and swallowing test once his secretions improve and can use Passy-Muir valve.   Objective: Vitals:   08/20/20 0039 08/20/20 0159 08/20/20 0347 08/20/20 0600  BP:    104/69  Pulse:  (!) 103 90 97  Resp:  (!) 24 20 (!) 22  Temp: 99.1 F (37.3 C)   98.9 F (37.2 C)  TempSrc: Oral   Oral  SpO2:  95% 99% 100%  Weight:      Height:        Intake/Output Summary (Last 24 hours) at 08/20/2020 1027 Last data filed at  08/20/2020 [id number] Gross per 24 hour  Intake 913.01 ml  Output 650 ml  Net 263.01 ml   Filed Weights   08/14/20 1214  Weight: 54.4 kg    Examination:  General exam: Looks comfortable.  Slightly anxious and frustrated being in the hospital.  Currently on 5 L oxygen through the trach collar. CVS: S1-S2 normal.  No added sounds. Pulmonary: Bilateral conducted airway sounds.  Mostly good bilateral air entry. Abdomen: Soft and nontender.  G-tube in place. Extremity: Soft nontender, no cyanosis or edema.    Data Reviewed: I have personally reviewed following labs and imaging studies  CBC: Recent Labs  Lab 08/14/20 1206 08/15/20 0240 08/16/20 0350 08/17/20 0408 08/18/20 0221 08/19/20 0350 08/20/20 0454  WBC 20.3* 12.5* 13.8* 13.4* 12.6* 10.2 10.5  NEUTROABS 18.7* 11.8*  --   --   --   --   --   HGB 11.2* 8.4* 8.2* 8.5* 8.6* 8.2* 8.2*  HCT 34.9* 26.6* 26.4* 27.1* 27.7* 26.5* 26.5*  MCV 93.6 94.3 94.3 96.1 95.8 96.0 97.1  PLT 498* 393 426* 441* 481* 495* [id number]*   Basic Metabolic Panel: Recent Labs  Lab 08/16/20 0350 08/17/20 0408 08/18/20 0221 08/19/20 0350 08/20/20 0454  NA 137 136 138 140 140  K 3.3* 3.8 3.8 3.4* 4.1  CL 100 103 102 104 108  CO2 '29 27 27 28 26  '$ GLUCOSE 92 102* 90 95 95  BUN 23* 20 19 23* 24*  CREATININE 0.57* 0.52* 0.55* 0.49* 0.49*  CALCIUM 8.6* 8.5* 8.8* 8.6* 8.7*   GFR: Estimated Creatinine Clearance: 80.3 mL/min (A) (by C-G formula based on SCr of 0.49 mg/dL (L)). Liver Function Tests: Recent Labs  Lab 08/14/20 1810 08/15/20 0240 08/18/20 0221 08/19/20 0350 08/20/20 0454  AST 40 50* 69* 70* 55*  ALT 124* 91* 95* 88* 78*  ALKPHOS 233* 208* 202* 178* 166*  BILITOT 2.1* 1.7* 0.7 0.6 0.5  PROT 6.8 5.4* 5.6* 5.5* 5.6*  ALBUMIN 2.3* 1.9* 1.7* 1.8* 1.8*   No results for input(s): LIPASE, AMYLASE in the last 168 hours. No results for input(s): AMMONIA in the last 168 hours. Coagulation Profile: Recent Labs  Lab 08/14/20 1206  INR 1.2    Cardiac Enzymes: No results for input(s): CKTOTAL, CKMB, CKMBINDEX, TROPONINI in the last 168 hours. BNP (last 3 results) No results for input(s): PROBNP in the last 8760 hours. HbA1C: No results for input(s): HGBA1C in the last 72 hours. CBG: Recent Labs  Lab 08/14/20 2154  GLUCAP 156*   Lipid Profile: No results for input(s): CHOL, HDL, LDLCALC, TRIG, CHOLHDL, LDLDIRECT in the last 72 hours. Thyroid Function Tests: No results for input(s): TSH, T4TOTAL, FREET4, T3FREE, THYROIDAB in the last 72 hours. Anemia Panel: Recent Labs    08/19/20 1447  VITAMINB12 1,223*  FOLATE 15.2  FERRITIN 1,356*  TIBC 174*  IRON 15*   Sepsis Labs: Recent Labs  Lab 08/14/20 1206  LATICACIDVEN 1.8    Recent Results (from the past 240 hour(s))  Blood culture (routine single)     Status: None   Collection Time: 08/14/20  11:21 AM   Specimen: BLOOD  Result Value Ref Range Status   Specimen Description   Final    BLOOD RIGHT ANTECUBITAL Performed at [facility] [address], [place], [place] [address]    Special Requests   Final    BOTTLES DRAWN AEROBIC AND ANAEROBIC Blood Culture results may not be optimal due to an excessive volume of blood received in culture bottles Performed at [facility] [address], [place], [place] [address]    Culture   Final    NO GROWTH 5 DAYS Performed at [facility], [place] [address], [place], [place] [address]    Report Status 08/19/2020 FINAL  Final  Culture, blood (single)     Status: None   Collection Time: 08/14/20 12:29 PM   Specimen: BLOOD  Result Value Ref Range Status   Specimen Description   Final    BLOOD BLOOD RIGHT FOREARM Performed at [facility] [address], [place], [place] [address]    Special Requests   Final    BOTTLES DRAWN AEROBIC AND ANAEROBIC Blood Culture adequate volume Performed at [facility] [address], [place],  [place] [address]    Culture   Final    NO GROWTH 5 DAYS Performed at [facility], [place] [address] [place],  [address]    Report Status 08/19/2020 FINAL  Final  Resp Panel by RT-PCR (Flu A&B, Covid) Nasopharyngeal Swab     Status: None   Collection Time: 08/14/20  2:23 PM   Specimen: Nasopharyngeal Swab; Nasopharyngeal(NP) swabs in vial transport medium  Result Value Ref Range Status   SARS Coronavirus 2 by RT PCR NEGATIVE NEGATIVE Final    Comment: (NOTE) SARS-CoV-2 target nucleic acids are NOT DETECTED.  The SARS-CoV-2 RNA is generally detectable in upper respiratory specimens during the acute phase of infection. The lowest concentration of SARS-CoV-2 viral copies this assay can detect is 138 copies/mL. A negative result does not preclude SARS-Cov-2 infection and should not be used as the sole basis for treatment or other patient management decisions. A negative result may occur with  improper specimen collection/handling, submission of specimen other than nasopharyngeal swab, presence of viral mutation(s) within the areas targeted by this assay, and inadequate number of viral copies(<138 copies/mL). A negative result must be combined with clinical observations, patient history, and epidemiological information. The expected result is Negative.  Fact Sheet for Patients:  EntrepreneurPulse.com.au  Fact Sheet for Healthcare Providers:  IncredibleEmployment.be  This test is no t yet approved or cleared by the [place] FDA and  has been authorized for detection and/or diagnosis of SARS-CoV-2 by FDA under an Emergency Use Authorization (EUA). This EUA will remain  in effect (meaning this test can be used) for the duration of the COVID-19 declaration under Section 564(b)(1) of the Act, 21 U.S.C.section 360bbb-3(b)(1), unless the authorization is terminated  or revoked sooner.       Influenza A by PCR NEGATIVE NEGATIVE Final    Influenza B by PCR NEGATIVE NEGATIVE Final    Comment: (NOTE) The Xpert Xpress SARS-CoV-2/FLU/RSV plus assay is intended as an aid in the diagnosis of influenza from Nasopharyngeal swab specimens and should not be used as a sole basis for treatment. Nasal washings and aspirates are unacceptable for Xpert Xpress SARS-CoV-2/FLU/RSV testing.  Fact Sheet for Patients: EntrepreneurPulse.com.au  Fact Sheet for Healthcare Providers: IncredibleEmployment.be  This test is not yet approved or cleared by the [place]  States FDA and has been authorized for detection and/or diagnosis of SARS-CoV-2 by FDA under an Emergency Use Authorization (EUA). This EUA will remain in effect (meaning this test can be used) for the duration of the COVID-19 declaration under Section 564(b)(1) of the Act, 21 U.S.C. section 360bbb-3(b)(1), unless the authorization is terminated or revoked.  Performed at [facility], [place] [address] [place], [place] [address]   Urine Culture     Status: None   Collection Time: 08/15/20 12:51 PM   Specimen: In/Out Cath Urine  Result Value Ref Range Status   Specimen Description   Final    IN/OUT CATH URINE Performed at [facility] [address], [place], [place] [address]    Special Requests   Final    NONE Performed at [facility], [place] [address] [place], [place] [address]    Culture   Final    NO GROWTH Performed at [facility], [place] [address], [place], [place] [address]    Report Status 08/16/2020 FINAL  Final  MRSA Next Gen by PCR, Nasal     Status: None   Collection Time: 08/16/20  9:01 PM   Specimen: Nasal Mucosa; Nasal Swab  Result Value Ref Range Status   MRSA by PCR Next Gen NOT DETECTED NOT DETECTED Final    Comment: (NOTE) The GeneXpert MRSA Assay (FDA approved for NASAL specimens only), is one component of a comprehensive MRSA colonization  surveillance program. It is not intended to diagnose MRSA infection nor to guide or monitor treatment for MRSA infections. Test performance is not FDA approved in patients less than 64 years old. Performed at [facility], [place] [address], [place], [place] [address]   Culture, Respiratory w Gram Stain     Status: None   Collection Time: 08/17/20  9:25 AM   Specimen: Tracheal Aspirate  Result Value Ref Range Status   Specimen Description   Final    TRACHEAL ASPIRATE Performed at [facility] [address] [place], [place] [address]    Special Requests   Final    NONE Performed at [facility], [place] [address] [place], [place] [address]    Gram Stain   Final    RARE WBC PRESENT,BOTH PMN AND MONONUCLEAR RARE GRAM POSITIVE COCCI IN PAIRS Performed at [facility], [place] [address] [place], [place] [address]    Culture   Final    RARE STAPHYLOCOCCUS AUREUS RARE PSEUDOMONAS AERUGINOSA    Report Status 08/19/2020 FINAL  Final   Organism ID, Bacteria STAPHYLOCOCCUS AUREUS  Final   Organism ID, Bacteria PSEUDOMONAS AERUGINOSA  Final      Susceptibility   Pseudomonas aeruginosa - MIC*    CEFTAZIDIME <=1 SENSITIVE Sensitive     CIPROFLOXACIN <=0.25 SENSITIVE Sensitive     GENTAMICIN <=1 SENSITIVE Sensitive     IMIPENEM <=0.25 SENSITIVE Sensitive     PIP/TAZO <=4 SENSITIVE Sensitive     CEFEPIME 2 SENSITIVE Sensitive     * RARE PSEUDOMONAS AERUGINOSA   Staphylococcus aureus - MIC*    CIPROFLOXACIN <=0.5 SENSITIVE Sensitive     ERYTHROMYCIN <=0.25 SENSITIVE Sensitive     GENTAMICIN <=0.5 SENSITIVE Sensitive     OXACILLIN <=0.25 SENSITIVE Sensitive     TETRACYCLINE <=1 SENSITIVE Sensitive     VANCOMYCIN 1 SENSITIVE Sensitive     TRIMETH/SULFA <=10 SENSITIVE Sensitive     CLINDAMYCIN <=0.25 SENSITIVE Sensitive  RIFAMPIN <=0.5 SENSITIVE Sensitive     Inducible Clindamycin NEGATIVE Sensitive     * RARE STAPHYLOCOCCUS  AUREUS         Radiology Studies: CT CHEST WO CONTRAST  Result Date: 08/19/2020 CLINICAL DATA:  Head and neck cancer status post surgery and radiation therapy, fever, short of breath EXAM: CT CHEST WITHOUT CONTRAST TECHNIQUE: Multidetector CT imaging of the chest was performed following the standard protocol without IV contrast. COMPARISON:  08/18/2020, 04/05/2020 FINDINGS: Cardiovascular: Unenhanced imaging of the heart and great vessels demonstrate no pericardial effusion. Normal caliber of the thoracic aorta. Moderate atherosclerosis of the aorta and coronary vasculature. Right chest wall port via internal jugular approach tip within the superior vena cava. Mediastinum/Nodes: Stable tracheostomy tube. Central airways are patent. Thyroid and esophagus are unremarkable. No pathologic mediastinal or hilar adenopathy. Lungs/Pleura: There is dense right upper lobe consolidation with areas of central cavitation. Findings most likely reflect necrotizing pneumonia given the lack of significant findings in this region on the 04/05/2020 exam, though close follow-up will be needed to exclude neoplasm. No effusion or pneumothorax. Mild upper lobe predominant emphysema is noted. Upper Abdomen: A percutaneous gastrostomy tube is partially visualized. No acute upper abdominal process. Musculoskeletal: No acute or destructive bony lesions. Reconstructed images demonstrate no additional findings. IMPRESSION: 1. Dense right upper lobe consolidation with central cavitation most compatible with necrotizing pneumonia. Underlying neoplasm cannot be excluded though is less likely given the lack of significant findings in this region on the recent PET scan. Continued radiographic follow-up is recommended. 2. Aortic Atherosclerosis (ICD10-I70.0) and Emphysema (ICD10-J43.9). Electronically Signed   By: Randa Ngo M.D.   On: 08/19/2020 15:19        Scheduled Meds:  Chlorhexidine Gluconate Cloth  6 each Topical Daily    enoxaparin (LOVENOX) injection  40 mg Subcutaneous Q24H   feeding supplement (OSMOLITE 1.5 CAL)  237 mL Per Tube 6 X Daily   potassium chloride  40 mEq Per Tube BID   sodium chloride flush  10-40 mL Intracatheter Q12H   Continuous Infusions:  sodium chloride 250 mL (08/19/20 0119)   ceFEPime (MAXIPIME) IV 2 g (08/20/20 0237)   lactated ringers     metronidazole 500 mg (08/20/20 0358)     LOS: 5 days    Time spent: 25 minutes    Barb Merino, MD [facility] Pager [phone]

## 2020-08-20 NOTE — Progress Notes (Signed)
Called by RN. Pt had removed entire trach. He was in no distress o2 saturations were 95% on RA. Trach did appear to be completely clogged with secretions. I removed the secreations and reinserted the trach without complication. I then lavaged and suctioned.

## 2020-08-20 NOTE — Progress Notes (Signed)
Notified on call about patient having 6 beats of V. Tach.

## 2020-08-20 NOTE — Plan of Care (Signed)

## 2020-08-21 DIAGNOSIS — A419 Sepsis, unspecified organism: Secondary | ICD-10-CM | POA: Diagnosis not present

## 2020-08-21 DIAGNOSIS — J9601 Acute respiratory failure with hypoxia: Secondary | ICD-10-CM | POA: Diagnosis not present

## 2020-08-21 DIAGNOSIS — E43 Unspecified severe protein-calorie malnutrition: Secondary | ICD-10-CM | POA: Diagnosis not present

## 2020-08-21 LAB — BASIC METABOLIC PANEL
Anion gap: 6 (ref 5–15)
BUN: 22 mg/dL — ABNORMAL HIGH (ref 6–20)
CO2: 27 mmol/L (ref 22–32)
Calcium: 8.8 mg/dL — ABNORMAL LOW (ref 8.9–10.3)
Chloride: 104 mmol/L (ref 98–111)
Creatinine, Ser: 0.39 mg/dL — ABNORMAL LOW (ref 0.61–1.24)
GFR, Estimated: >60 mL/min (ref >60)
Glucose, Bld: 96 mg/dL (ref 70–99)
Potassium: 4.6 mmol/L (ref 3.5–5.1)
Sodium: 137 mmol/L (ref 135–145)

## 2020-08-21 LAB — CBC WITH DIFFERENTIAL/PLATELET
Abs Immature Granulocytes: 0.21 10*3/uL — ABNORMAL HIGH (ref 0.00–0.07)
Basophils Absolute: 0 10*3/uL (ref 0.0–0.1)
Basophils Relative: 0 %
Eosinophils Absolute: 0.2 10*3/uL (ref 0.0–0.5)
Eosinophils Relative: 1 %
HCT: 27.7 % — ABNORMAL LOW (ref 39.0–52.0)
Hemoglobin: 8.5 g/dL — ABNORMAL LOW (ref 13.0–17.0)
Immature Granulocytes: 2 %
Lymphocytes Relative: 4 %
Lymphs Abs: 0.5 10*3/uL — ABNORMAL LOW (ref 0.7–4.0)
MCH: 29.6 pg (ref 26.0–34.0)
MCHC: 30.7 g/dL (ref 30.0–36.0)
MCV: 96.5 fL (ref 80.0–100.0)
Monocytes Absolute: 0.6 10*3/uL (ref 0.1–1.0)
Monocytes Relative: 5 %
Neutro Abs: 11.4 10*3/uL — ABNORMAL HIGH (ref 1.7–7.7)
Neutrophils Relative %: 88 %
Platelets: 569 10*3/uL — ABNORMAL HIGH (ref 150–400)
RBC: 2.87 MIL/uL — ABNORMAL LOW (ref 4.22–5.81)
RDW: 16 % — ABNORMAL HIGH (ref 11.5–15.5)
WBC: 12.9 10*3/uL — ABNORMAL HIGH (ref 4.0–10.5)
nRBC: 0 % (ref 0.0–0.2)

## 2020-08-21 LAB — OCCULT BLOOD X 1 CARD TO LAB, STOOL: Fecal Occult Bld: NEGATIVE

## 2020-08-21 LAB — MAGNESIUM: Magnesium: 1.9 mg/dL (ref 1.7–2.4)

## 2020-08-21 LAB — PHOSPHORUS: Phosphorus: 2.1 mg/dL — ABNORMAL LOW (ref 2.5–4.6)

## 2020-08-21 MED ORDER — LEVOFLOXACIN 750 MG PO TABS: 750.0000 mg | ORAL_TABLET | Freq: Every day | ORAL | 0 refills | Status: AC

## 2020-08-21 MED ORDER — AMOXICILLIN-POT CLAVULANATE 250-62.5 MG/5ML PO SUSR: 750.0000 mg | Freq: Two times a day (BID) | ORAL | 0 refills | Status: DC

## 2020-08-21 MED ORDER — POTASSIUM & SODIUM PHOSPHATES 280-160-250 MG PO PACK: 1.0000 | PACK | Freq: Three times a day (TID) | ORAL | 0 refills | Status: AC

## 2020-08-21 MED ORDER — HEPARIN SOD (PORK) LOCK FLUSH 100 UNIT/ML IV SOLN
500.0000 [IU] | INTRAVENOUS | Status: DC | PRN
  Filled 2020-08-21: qty 5

## 2020-08-21 MED ORDER — AMOXICILLIN-POT CLAVULANATE 250-62.5 MG/5ML PO SUSR: 750.0000 mg | Freq: Two times a day (BID) | ORAL | 0 refills | Status: AC

## 2020-08-21 MED ORDER — LEVOFLOXACIN 750 MG PO TABS: 750.0000 mg | ORAL_TABLET | Freq: Every day | ORAL | 0 refills | Status: DC

## 2020-08-21 MED ORDER — POTASSIUM & SODIUM PHOSPHATES 280-160-250 MG PO PACK: 1.0000 | PACK | Freq: Three times a day (TID) | ORAL | 0 refills | Status: DC

## 2020-08-21 MED ORDER — POTASSIUM & SODIUM PHOSPHATES 280-160-250 MG PO PACK
1.0000 | PACK | Freq: Three times a day (TID) | ORAL | Status: DC
  Administered 2020-08-21 (×2): 1
  Filled 2020-08-21 (×2): qty 1

## 2020-08-21 NOTE — Progress Notes (Signed)
PROGRESS NOTE    Erik [name]  [id number] DOB: Sep 17, 1965 DOA: 08/14/2020 PCP: [name], NP    Brief Narrative:  55 year old male with history of laryngeal carcinoma status chemoradiation.  Patient has trach collar and G-tube.  Recently treated with steroids for neck swelling.  Started having temperature and shortness of breath so came to the hospital.   Chest x-ray consistent with right upper lobe pneumonia, new oxygen requirement so admitted to the hospital.  On presentation, temperature was 101.1, WBC count 20.3, heart rate was 140. COVID test negative.  Antibiotics were started. Remains poorly compliant for chest physiotherapy.  CT scan consistent with necrotizing cavitary pneumonia.  Sputum cultures with MSSA and Pseudomonas.  Assessment & Plan:   Principal Problem:   Sepsis ([place]) Active Problems:   Laryngeal cancer ([place])   G tube feedings ([place])   CAP (community acquired pneumonia)   Acute respiratory failure ([place])   Protein-calorie malnutrition, severe  Sepsis secondary to MSSA and Pseudomonas pneumonia: Patient was treated with vancomycin and cefepime and Flagyl.  With clinical improvement currently remains on cefepime and Flagyl.  WBC count normalized.  MRSA swab was negative. Blood cultures negative so far. Sputum cultures positive for MSSA and Pseudomonas, however they were done after many days on antibiotic therapy. CT scan consistent with cavitary pneumonia right upper lobe. Tracheal aspirate thick and mucoid. Patient is now tolerating chest physiotherapy, continue aggressive chest physiotherapy.  Mostly able to wean off to room air, using trach collar for humidification. Will change to Levaquin and Augmentin for 3 weeks on discharge as discussed with infectious disease and pharmacy.  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  Is stable.  Seen by dietitian, changes were done. To be seen by  speech today for swallow evaluation and Passy-Muir valve placement.  Acute on chronic anemia: Stable.  Abnormal liver function test: Stable.  Severe protein calorie malnutrition: Patient continues to have low albumin and poor feeding schedule. Will ask dietitian to review tube feeding.   DVT prophylaxis: enoxaparin (LOVENOX) injection 40 mg Start: 08/14/20 1545   Code Status: Full code Family Communication: None today. Disposition Plan: Status is: Inpatient  Remains inpatient appropriate because:IV treatments appropriate due to intensity of illness or inability to take PO and Inpatient level of care appropriate due to severity of illness  Dispo: The patient is from: Home              Anticipated d/c is to: Home              Patient currently is not medically stable to d/c.   Difficult to place patient No         Consultants:  None  Procedures:  None  Antimicrobials:  Rocephin azithromycin 7/16--- 7/18 Vancomycin 7/18-7/20  cefepime and Flagyl 7/18----   Subjective: Patient seen and examined.  No overnight events.  Temperature mostly normalizing. Tolerating chest physiotherapy.  Walked around in the hallway without supplemental oxygen.  Wants to go home. He wants to start drinking water and ice chips.  Will allow his speech therapy to evaluate before that.  Objective: Vitals:   08/20/20 2207 08/21/20 0205 08/21/20 0602 08/21/20 0922  BP: 109/67 108/74 103/69   Pulse: 96     Resp: (!) 21 19 (!) 24   Temp: 98.7 F (37.1 C) 99.7 F (37.6 C) 99.5 F (37.5 C)   TempSrc: Oral Oral Oral   SpO2: 98% 98% 97% 98%  Weight:      Height:        Intake/Output Summary (Last 24 hours) at 08/21/2020 1040 Last data filed at 08/21/2020 0838 Gross per 24 hour  Intake 1032.49 ml  Output 1550 ml  Net -517.51 ml   Filed Weights   08/14/20 1214 08/20/20 1549  Weight: 54.4 kg 50.7 kg    Examination:  General exam: Looks fairly comfortable today.  Mostly on room air.    Tracheostomy looks clean and dry.   CVS: S1-S2 normal.  No added sounds. Pulmonary: Bilateral conducted airway sounds.  Mostly good bilateral air entry. Abdomen: Soft and nontender.  G-tube in place. Extremity: Soft nontender, no cyanosis or edema.    Data Reviewed: I have personally reviewed following labs and imaging studies  CBC: Recent Labs  Lab 08/14/20 1206 08/15/20 0240 08/16/20 0350 08/17/20 0408 08/18/20 0221 08/19/20 0350 08/20/20 0454 08/21/20 0342  WBC 20.3* 12.5*   < > 13.4* 12.6* 10.2 10.5 12.9*  NEUTROABS 18.7* 11.8*  --   --   --   --   --  11.4*  HGB 11.2* 8.4*   < > 8.5* 8.6* 8.2* 8.2* 8.5*  HCT 34.9* 26.6*   < > 27.1* 27.7* 26.5* 26.5* 27.7*  MCV 93.6 94.3   < > 96.1 95.8 96.0 97.1 96.5  PLT 498* 393   < > 441* 481* 495* 570* 569*   < > = values in this interval not displayed.   Basic Metabolic Panel: Recent Labs  Lab 08/17/20 0408 08/18/20 0221 08/19/20 0350 08/20/20 0454 08/21/20 0342  NA 136 138 140 140 137  K 3.8 3.8 3.4* 4.1 4.6  CL 103 102 104 108 104  CO2 '27 27 28 26 27  '$ GLUCOSE 102* 90 95 95 96  BUN 20 19 23* 24* 22*  CREATININE 0.52* 0.55* 0.49* 0.49* 0.39*  CALCIUM 8.5* 8.8* 8.6* 8.7* 8.8*  MG  --   --   --   --  1.9  PHOS  --   --   --   --  2.1*   GFR: Estimated Creatinine Clearance: 74.8 mL/min (A) (by C-G formula based on SCr of 0.39 mg/dL (L)). Liver Function Tests: Recent Labs  Lab 08/14/20 1810 08/15/20 0240 08/18/20 0221 08/19/20 0350 08/20/20 0454  AST 40 50* 69* 70* 55*  ALT 124* 91* 95* 88* 78*  ALKPHOS 233* 208* 202* 178* 166*  BILITOT 2.1* 1.7* 0.7 0.6 0.5  PROT 6.8 5.4* 5.6* 5.5* 5.6*  ALBUMIN 2.3* 1.9* 1.7* 1.8* 1.8*   No results for input(s): LIPASE, AMYLASE in the last 168 hours. No results for input(s): AMMONIA in the last 168 hours. Coagulation Profile: Recent Labs  Lab 08/14/20 1206  INR 1.2   Cardiac Enzymes: No results for input(s): CKTOTAL, CKMB, CKMBINDEX, TROPONINI in the last 168  hours. BNP (last 3 results) No results for input(s): PROBNP in the last 8760 hours. HbA1C: No results for input(s): HGBA1C in the last 72 hours. CBG: Recent Labs  Lab 08/14/20 2154  GLUCAP 156*   Lipid Profile: No results for input(s): CHOL, HDL, LDLCALC, TRIG, CHOLHDL, LDLDIRECT in the last 72 hours. Thyroid Function Tests: No results for input(s): TSH, T4TOTAL, FREET4, T3FREE, THYROIDAB in the last 72 hours. Anemia Panel: Recent Labs    08/19/20 1447  VITAMINB12 1,223*  FOLATE 15.2  FERRITIN 1,356*  TIBC 174*  IRON 15*   Sepsis Labs: Recent Labs  Lab 08/14/20 1206  LATICACIDVEN 1.8    Recent Results (from  the past 240 hour(s))  Blood culture (routine single)     Status: None   Collection Time: 08/14/20 11:21 AM   Specimen: BLOOD  Result Value Ref Range Status   Specimen Description   Final    BLOOD RIGHT ANTECUBITAL Performed at [facility] [address], [place], [place] [address]    Special Requests   Final    BOTTLES DRAWN AEROBIC AND ANAEROBIC Blood Culture results may not be optimal due to an excessive volume of blood received in culture bottles Performed at [facility] [address] [place], [place] [address]    Culture   Final    NO GROWTH 5 DAYS Performed at [facility], [place] [address], [place], [place] [address]    Report Status 08/19/2020 FINAL  Final  Culture, blood (single)     Status: None   Collection Time: 08/14/20 12:29 PM   Specimen: BLOOD  Result Value Ref Range Status   Specimen Description   Final    BLOOD BLOOD RIGHT FOREARM Performed at [facility] [address], [place], [place] [address]    Special Requests   Final    BOTTLES DRAWN AEROBIC AND ANAEROBIC Blood Culture adequate volume Performed at [facility] [address], [place], [place] [address]    Culture   Final    NO GROWTH 5 DAYS Performed at [facility], [place]  [address], [place],  [address]    Report Status 08/19/2020 FINAL  Final  Resp Panel by RT-PCR (Flu A&B, Covid) Nasopharyngeal Swab     Status: None   Collection Time: 08/14/20  2:23 PM   Specimen: Nasopharyngeal Swab; Nasopharyngeal(NP) swabs in vial transport medium  Result Value Ref Range Status   SARS Coronavirus 2 by RT PCR NEGATIVE NEGATIVE Final    Comment: (NOTE) SARS-CoV-2 target nucleic acids are NOT DETECTED.  The SARS-CoV-2 RNA is generally detectable in upper respiratory specimens during the acute phase of infection. The lowest concentration of SARS-CoV-2 viral copies this assay can detect is 138 copies/mL. A negative result does not preclude SARS-Cov-2 infection and should not be used as the sole basis for treatment or other patient management decisions. A negative result may occur with  improper specimen collection/handling, submission of specimen other than nasopharyngeal swab, presence of viral mutation(s) within the areas targeted by this assay, and inadequate number of viral copies(<138 copies/mL). A negative result must be combined with clinical observations, patient history, and epidemiological information. The expected result is Negative.  Fact Sheet for Patients:  EntrepreneurPulse.com.au  Fact Sheet for Healthcare Providers:  IncredibleEmployment.be  This test is no t yet approved or cleared by the [place] FDA and  has been authorized for detection and/or diagnosis of SARS-CoV-2 by FDA under an Emergency Use Authorization (EUA). This EUA will remain  in effect (meaning this test can be used) for the duration of the COVID-19 declaration under Section 564(b)(1) of the Act, 21 U.S.C.section 360bbb-3(b)(1), unless the authorization is terminated  or revoked sooner.       Influenza A by PCR NEGATIVE NEGATIVE Final   Influenza B by PCR NEGATIVE NEGATIVE Final    Comment: (NOTE) The Xpert Xpress SARS-CoV-2/FLU/RSV plus  assay is intended as an aid in the diagnosis of influenza from Nasopharyngeal swab specimens and should not be used as a sole basis for treatment. Nasal washings and aspirates are unacceptable for Xpert Xpress SARS-CoV-2/FLU/RSV testing.  Fact Sheet for Patients:  EntrepreneurPulse.com.au  Fact Sheet for Healthcare Providers: IncredibleEmployment.be  This test is not yet approved or cleared by the [place] FDA and has been authorized for detection and/or diagnosis of SARS-CoV-2 by FDA under an Emergency Use Authorization (EUA). This EUA will remain in effect (meaning this test can be used) for the duration of the COVID-19 declaration under Section 564(b)(1) of the Act, 21 U.S.C. section 360bbb-3(b)(1), unless the authorization is terminated or revoked.  Performed at [facility], [place] [address], [place], [place] [address]   Urine Culture     Status: None   Collection Time: 08/15/20 12:51 PM   Specimen: In/Out Cath Urine  Result Value Ref Range Status   Specimen Description   Final    IN/OUT CATH URINE Performed at [facility] [address] [place], [place] [address]    Special Requests   Final    NONE Performed at [facility], [place] [address] [place], [place] [address]    Culture   Final    NO GROWTH Performed at [facility], [place] [address], [place], [place] [address]    Report Status 08/16/2020 FINAL  Final  MRSA Next Gen by PCR, Nasal     Status: None   Collection Time: 08/16/20  9:01 PM   Specimen: Nasal Mucosa; Nasal Swab  Result Value Ref Range Status   MRSA by PCR Next Gen NOT DETECTED NOT DETECTED Final    Comment: (NOTE) The GeneXpert MRSA Assay (FDA approved for NASAL specimens only), is one component of a comprehensive MRSA colonization surveillance program. It is not intended to diagnose MRSA infection nor to guide or monitor treatment for MRSA  infections. Test performance is not FDA approved in patients less than 46 years old. Performed at [facility], [place] [address] [place], [place] [address]   Culture, Respiratory w Gram Stain     Status: None   Collection Time: 08/17/20  9:25 AM   Specimen: Tracheal Aspirate  Result Value Ref Range Status   Specimen Description   Final    TRACHEAL ASPIRATE Performed at [facility] [address], [place], [place] [address]    Special Requests   Final    NONE Performed at [facility], [place] [address] [place], [place] [address]    Gram Stain   Final    RARE WBC PRESENT,BOTH PMN AND MONONUCLEAR RARE GRAM POSITIVE COCCI IN PAIRS Performed at [facility], [place] [address] [place], [place] [address]    Culture   Final    RARE STAPHYLOCOCCUS AUREUS RARE PSEUDOMONAS AERUGINOSA    Report Status 08/19/2020 FINAL  Final   Organism ID, Bacteria STAPHYLOCOCCUS AUREUS  Final   Organism ID, Bacteria PSEUDOMONAS AERUGINOSA  Final      Susceptibility   Pseudomonas aeruginosa - MIC*    CEFTAZIDIME <=1 SENSITIVE Sensitive     CIPROFLOXACIN <=0.25 SENSITIVE Sensitive     GENTAMICIN <=1 SENSITIVE Sensitive     IMIPENEM <=0.25 SENSITIVE Sensitive     PIP/TAZO <=4 SENSITIVE Sensitive     CEFEPIME 2 SENSITIVE Sensitive     * RARE PSEUDOMONAS AERUGINOSA   Staphylococcus aureus - MIC*    CIPROFLOXACIN <=0.5 SENSITIVE Sensitive     ERYTHROMYCIN <=0.25 SENSITIVE Sensitive     GENTAMICIN <=0.5 SENSITIVE Sensitive     OXACILLIN <=0.25 SENSITIVE Sensitive     TETRACYCLINE <=1 SENSITIVE Sensitive     VANCOMYCIN 1 SENSITIVE Sensitive  TRIMETH/SULFA <=10 SENSITIVE Sensitive     CLINDAMYCIN <=0.25 SENSITIVE Sensitive     RIFAMPIN <=0.5 SENSITIVE Sensitive     Inducible Clindamycin NEGATIVE Sensitive     * RARE STAPHYLOCOCCUS AUREUS         Radiology Studies: CT CHEST WO CONTRAST  Result Date: 08/19/2020 CLINICAL DATA:  Head  and neck cancer status post surgery and radiation therapy, fever, short of breath EXAM: CT CHEST WITHOUT CONTRAST TECHNIQUE: Multidetector CT imaging of the chest was performed following the standard protocol without IV contrast. COMPARISON:  08/18/2020, 04/05/2020 FINDINGS: Cardiovascular: Unenhanced imaging of the heart and great vessels demonstrate no pericardial effusion. Normal caliber of the thoracic aorta. Moderate atherosclerosis of the aorta and coronary vasculature. Right chest wall port via internal jugular approach tip within the superior vena cava. Mediastinum/Nodes: Stable tracheostomy tube. Central airways are patent. Thyroid and esophagus are unremarkable. No pathologic mediastinal or hilar adenopathy. Lungs/Pleura: There is dense right upper lobe consolidation with areas of central cavitation. Findings most likely reflect necrotizing pneumonia given the lack of significant findings in this region on the 04/05/2020 exam, though close follow-up will be needed to exclude neoplasm. No effusion or pneumothorax. Mild upper lobe predominant emphysema is noted. Upper Abdomen: A percutaneous gastrostomy tube is partially visualized. No acute upper abdominal process. Musculoskeletal: No acute or destructive bony lesions. Reconstructed images demonstrate no additional findings. IMPRESSION: 1. Dense right upper lobe consolidation with central cavitation most compatible with necrotizing pneumonia. Underlying neoplasm cannot be excluded though is less likely given the lack of significant findings in this region on the recent PET scan. Continued radiographic follow-up is recommended. 2. Aortic Atherosclerosis (ICD10-I70.0) and Emphysema (ICD10-J43.9). Electronically Signed   By: Randa Ngo M.D.   On: 08/19/2020 15:19        Scheduled Meds:  Chlorhexidine Gluconate Cloth  6 each Topical Daily   enoxaparin (LOVENOX) injection  40 mg Subcutaneous Q24H   feeding supplement (OSMOLITE 1.5 CAL)  355 mL Per  Tube QID   feeding supplement (PROSource TF)  45 mL Per Tube TID   free water  240 mL Per Tube QID   potassium & sodium phosphates  1 packet Per Tube TID   sodium chloride flush  10-40 mL Intracatheter Q12H   Continuous Infusions:  sodium chloride 250 mL (08/21/20 0215)   ceFEPime (MAXIPIME) IV 2 g (08/21/20 0857)   lactated ringers     metronidazole 500 mg (08/21/20 0432)     LOS: 6 days    Time spent: 25 minutes    Barb Merino, MD [facility] Pager [phone]

## 2020-08-21 NOTE — Evaluation (Signed)
Clinical/Bedside Swallow Evaluation Patient Details  Name: Erik [name] MRN: [id number] Date of Birth: 08/26/1965  Today's Date: 08/21/2020 Time: SLP Start Time (ACUTE ONLY): 1122 SLP Stop Time (ACUTE ONLY): 1134 SLP Time Calculation (min) (ACUTE ONLY): 12 min  Past Medical History:  Past Medical History:  Diagnosis Date   Coronary artery disease    Dyspnea    GERD (gastroesophageal reflux disease)    Phreesia 02/21/2020   Ruptured lumbar intervertebral disc    Past Surgical History:  Past Surgical History:  Procedure Laterality Date   CARDIAC CATHETERIZATION     with stent placement   IR GASTROSTOMY TUBE MOD SED  04/20/2020   IR IMAGING GUIDED PORT INSERTION  04/20/2020   TONSILLECTOMY     TRACHEOSTOMY TUBE PLACEMENT N/A 03/05/2020   Procedure: AWAKE TRACHEOSTOMY, DIRECT LARYNGOSCOPY WITH BIOPSY;  Surgeon: Melida Quitter, MD;  Location: [facility];  Service: ENT;  Laterality: N/A;   HPI:  55 year old male with medical history of laryngeal carcinoma s/p chemoradiation, s/p trahc and PEG, CAD, and GERD. He presented to the ED d/t fever and shortness of breath. CXR showed RUL PNA. CT chest showed necrotizing cavitary PNA. G-tube placement in 03/22   Assessment / Plan / Recommendation Clinical Impression  Pt was seen for a bedside swallow evaluation and he presents with suspected mild oropharyngeal dysphagia.  Pt had a PEG tube placed in 03/22.  Pt reported that at baseline, he consumes some water, juice, and tea by mouth, but otherwise he receives his primary nutrition via PEG tube.  He stated that he does not consume solids secondary to his taste buds being significantly altered following radiation therapy.  Oral mech examination was [facility].  PMV was not donned during this evaluation secondary to pt discomfort.  Pt consumed thin liquid via cup sip independently.  Multiple swallows were observed per bolus in 1/5 trials, possibly secondary to pharyngeal residue or esophageal dysfunction, but no  overt s/sx of aspiration were observed with thin liquid.  Pt politely refused solid trials for abovementioned reasons.  Recommend initiation of a clear liquid diet to allow patient to consume desired amount of thin liquid, but would recommend that primary nutrition continue to be received via PEG tube.  No further skilled ST is warranted at this time.  Please re-consult if additional needs arise.  SLP Visit Diagnosis: Aphonia (R49.1)    Aspiration Risk  Mild aspiration risk    Diet Recommendation Thin liquid   Liquid Administration via: Cup;Straw Medication Administration: Via alternative means Supervision: Patient able to self feed Compensations: Small sips/bites;Slow rate    Other  Recommendations Oral Care Recommendations: Oral care BID   Follow up Recommendations None        Swallow Study   General HPI: 55 year old male with medical history of laryngeal carcinoma s/p chemoradiation, s/p trahc and PEG, CAD, and GERD. He presented to the ED d/t fever and shortness of breath. CXR showed RUL PNA. CT chest showed necrotizing cavitary PNA. G-tube placement in 03/22 Type of Study: Bedside Swallow Evaluation Previous Swallow Assessment: BSE 03/07/20 - cleared for reg/thin diet Diet Prior to this Study: NPO Temperature Spikes Noted: Yes Respiratory Status: Trach Collar;Trach Trach Size and Type: #4;Uncuffed History of Recent Intubation: No Behavior/Cognition: Alert;Cooperative;Pleasant mood Oral Cavity Assessment: Within Functional Limits Oral Care Completed by SLP: No Vision: Functional for self-feeding Self-Feeding Abilities: Able to feed self Patient Positioning: Upright in bed Baseline Vocal Quality: Aphonic Volitional Cough: Weak Volitional Swallow: Able to elicit    Oral/Motor/Sensory Function  Overall Oral Motor/Sensory Function: Within functional limits   Ice Chips Ice chips: Not tested   Thin Liquid Thin Liquid: Within functional limits    Nectar Thick Nectar Thick  Liquid: Not tested   Honey Thick Honey Thick Liquid: Not tested   Puree Puree: Not tested   Solid     Solid: Not tested     Colin Mulders M.S., CCC-SLP Acute Rehabilitation Services Office: [phone]  Elvia Collum [facility] 08/21/2020,12:02 PM

## 2020-08-21 NOTE — Discharge Summary (Signed)
Physician Discharge Summary  Kell Elpers [id number] DOB: 08/13/1965 DOA: 08/14/2020  PCP: [name], NP  Admit date: 08/14/2020 Discharge date: 08/21/2020  Admitted From: Home. Disposition: Home.  Recommendations for Outpatient Follow-up:  Follow up with PCP in 1-2 weeks Continue to follow with oncology as a scheduled.  Home Health: Not applicable Equipment/Devices: Not applicable  Discharge Condition: Stable CODE STATUS: Full code Diet recommendation: Clear liquid diet.  Aspiration precautions.  PEG tube feeding.  Discharge summary: 55 year old male with history of laryngeal carcinoma status chemoradiation.  Patient has trach collar and G-tube.  Recently treated with steroids for neck swelling.  Started having temperature and shortness of breath so came to the hospital.   Chest x-ray consistent with right upper lobe pneumonia, new oxygen requirement so admitted to the hospital.  On presentation, temperature was 101.1, WBC count 20.3, heart rate was 140. COVID test negative.  Antibiotics were started. Remains poorly compliant for chest physiotherapy.  CT scan consistent with necrotizing cavitary pneumonia.  Sputum cultures with MSSA and Pseudomonas.   Assessment & Plan:   Principal Problem:   Sepsis ([place]) Active Problems:   Laryngeal cancer ([place])   G tube feedings ([place])   CAP (community acquired pneumonia)   Acute respiratory failure ([place])   Protein-calorie malnutrition, severe   Sepsis secondary to MSSA and Pseudomonas pneumonia: Patient was treated with vancomycin and cefepime and Flagyl.  With clinical improvement currently remains on cefepime and Flagyl.  WBC count normalized.  MRSA swab was negative. Blood cultures negative so far. Sputum cultures positive for MSSA and Pseudomonas, however they were done after many days on antibiotic therapy. CT scan consistent with cavitary pneumonia right upper lobe. Will change to Levaquin and Augmentin for 2 more weeks on  discharge as discussed with infectious disease and pharmacy. Patient will be able to do Augmentin through the PEG tube solution.  Levaquin solution not available, he will take it crushed in the PEG tube. Patient is also take over-the-counter probiotics.   Hypokalemia: Improved.   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  Is stable.  Seen by dietitian, on 4 times tube feeding. Safely swallowing clear liquids.  Declined to have solid food trial.  We will continue aspiration precautions and take clears.   Acute on chronic anemia: Stable.   Abnormal liver function test: Stable.   Patient mobilized in the hallway.  Remains afebrile.  Remains on room air.  Fairly stabilized.  Has enough support system at home including trach collar and oxygen supplies that he is not needing now.  Wants to go home.   Discharge Diagnoses:  Principal Problem:   Sepsis ([place]) Active Problems:   Laryngeal cancer ([place])   G tube feedings ([place])   CAP (community acquired pneumonia)   Acute respiratory failure ([place])   Protein-calorie malnutrition, severe    Discharge Instructions  Discharge Instructions     Call MD for:  difficulty breathing, headache or visual disturbances   Complete by: As directed    Call MD for:  temperature >100.4   Complete by: As directed    Diet general   Complete by: As directed    Clear liquid diet with aspiration precautions   Discharge instructions   Complete by: As directed    Take over-the-counter probiotics. As seen by speech therapy, can take clear liquids, all-time aspiration precautions.   Increase activity slowly   Complete by: As directed       Allergies as  of 08/21/2020   No Known Allergies      Medication List     STOP taking these medications    doxycycline 100 MG tablet Commonly known as: VIBRA-TABS   HYDROcodone-acetaminophen 7.5-325 mg/15 ml solution Commonly known as: HYCET   lidocaine-prilocaine  cream Commonly known as: EMLA   methylPREDNISolone 4 MG Tbpk tablet Commonly known as: MEDROL DOSEPAK   ondansetron 8 MG tablet Commonly known as: Zofran   prochlorperazine 10 MG tablet Commonly known as: COMPAZINE       TAKE these medications    amoxicillin-clavulanate 250-62.5 MG/5ML suspension Commonly known as: AUGMENTIN Place 15 mLs (750 mg total) into feeding tube 2 (two) times daily for 14 days.   feeding supplement (OSMOLITE 1.5 CAL) Liqd Give 1 1/2 cartons of tube feeding at 8am, noon, 4pm and 8pm via tube.  Flush with 35m of water before and after each feeding.  Must drink or give via tube 16oz bottle water for additional hydration.   levofloxacin 750 MG tablet Commonly known as: Levaquin 1 tablet (750 mg total) by Per J Tube route daily for 14 days.   lidocaine 2 % solution Commonly known as: XYLOCAINE Patient: Mix 1part 2% viscous lidocaine, 1part H20. Swallow 159mof diluted mixture, 2073mbefore meals and at bedtime, up to QID   potassium & sodium phosphates 280-160-250 MG Pack Commonly known as: PHOIoscopacket into feeding tube 3 (three) times daily for 7 days.        Follow-up Information     SteCamillia HerterP Follow up in 1 week(s).   Specialty: Nurse Practitioner Contact information: [address] [phone]             No Known Allergies  Consultations: None   Procedures/Studies: CT CHEST WO CONTRAST  Result Date: 08/19/2020 CLINICAL DATA:  Head and neck cancer status post surgery and radiation therapy, fever, short of breath EXAM: CT CHEST WITHOUT CONTRAST TECHNIQUE: Multidetector CT imaging of the chest was performed following the standard protocol without IV contrast. COMPARISON:  08/18/2020, 04/05/2020 FINDINGS: Cardiovascular: Unenhanced imaging of the heart and great vessels demonstrate no pericardial effusion. Normal caliber of the thoracic aorta. Moderate atherosclerosis of  the aorta and coronary vasculature. Right chest wall port via internal jugular approach tip within the superior vena cava. Mediastinum/Nodes: Stable tracheostomy tube. Central airways are patent. Thyroid and esophagus are unremarkable. No pathologic mediastinal or hilar adenopathy. Lungs/Pleura: There is dense right upper lobe consolidation with areas of central cavitation. Findings most likely reflect necrotizing pneumonia given the lack of significant findings in this region on the 04/05/2020 exam, though close follow-up will be needed to exclude neoplasm. No effusion or pneumothorax. Mild upper lobe predominant emphysema is noted. Upper Abdomen: A percutaneous gastrostomy tube is partially visualized. No acute upper abdominal process. Musculoskeletal: No acute or destructive bony lesions. Reconstructed images demonstrate no additional findings. IMPRESSION: 1. Dense right upper lobe consolidation with central cavitation most compatible with necrotizing pneumonia. Underlying neoplasm cannot be excluded though is less likely given the lack of significant findings in this region on the recent PET scan. Continued radiographic follow-up is recommended. 2. Aortic Atherosclerosis (ICD10-I70.0) and Emphysema (ICD10-J43.9). Electronically Signed   By: MicRanda NgoD.   On: 08/19/2020 15:19   DG CHEST PORT 1 VIEW  Result Date: 08/18/2020 CLINICAL DATA:  Pneumonia. EXAM: PORTABLE CHEST 1 VIEW COMPARISON:  08/14/2020. FINDINGS: Catheter tracheostomy tube in stable position. Heart size stable.  Persistent right upper lung dense infiltrate, no change from prior exam. No pleural effusion or pneumothorax. IMPRESSION: 1.  PowerPort catheter tracheostomy tube in stable position. 2. Persistent dense right upper lung infiltrate consistent with pneumonia. No interim change. Electronically Signed   By: Marcello Moores  Register   On: 08/18/2020 06:06   DG Chest Port 1 View  Result Date: 08/14/2020 CLINICAL DATA:  Possible sepsis 8 a  EXAM: PORTABLE CHEST 1 VIEW COMPARISON:  Prior chest x-ray 03/04/2020 FINDINGS: Marked new patchy airspace opacity in the right upper lobe. A right IJ single-lumen power injectable port catheter in place. The tip of the catheter overlies the distal SVC. Cardiac and mediastinal contours are unchanged. Tracheostomy tube present. Tip midline and at the level of the clavicles. Atherosclerotic calcification again noted in the transverse aorta. No acute osseous abnormality. IMPRESSION: Right upper lobe pneumonia. Electronically Signed   By: Jacqulynn Cadet M.D.   On: 08/14/2020 12:26   US Abdomen Limited RUQ (LIVER/GB)  Result Date: 08/14/2020 CLINICAL DATA:  Abnormal liver function tests, laryngeal malignancy EXAM: ULTRASOUND ABDOMEN LIMITED RIGHT UPPER QUADRANT COMPARISON:  04/05/2020 FINDINGS: Gallbladder: No gallstones or wall thickening visualized. No sonographic Murphy sign noted by sonographer. Common bile duct: Diameter: 4 mm Liver: No focal lesion identified. Within normal limits in parenchymal echogenicity. Portal vein is patent on color Doppler imaging with normal direction of blood flow towards the liver. Other: Fluid-filled structure adjacent to the left lobe of the liver denoted by the sonographer on the provided images likely reflects a fluid-filled portion of the stomach or proximal duodenum. IMPRESSION: 1. Unremarkable right upper quadrant ultrasound. Electronically Signed   By: Randa Ngo M.D.   On: 08/14/2020 17:24   (Echo, Carotid, EGD, Colonoscopy, ERCP)    Subjective: Patient seen and examined.  Went back to check on him in the afternoon after he safely swallow liquids and walked around in the unit.  In the room air.  Wants to go home.   Discharge Exam: Vitals:   08/21/20 0602 08/21/20 0922  BP: 103/69   Pulse:    Resp: (!) 24   Temp: 99.5 F (37.5 C)   SpO2: 97% 98%   Vitals:   08/20/20 2207 08/21/20 0205 08/21/20 0602 08/21/20 0922  BP: 109/67 108/74 103/69   Pulse: 96      Resp: (!) 21 19 (!) 24   Temp: 98.7 F (37.1 C) 99.7 F (37.6 C) 99.5 F (37.5 C)   TempSrc: Oral Oral Oral   SpO2: 98% 98% 97% 98%  Weight:      Height:        General: Pt is alert, awake, not in acute distress Dysphonia due to tracheostomy.  Looks comfortable on room air. Trach site clean and dry. Cardiovascular: RRR, S1/S2 +, no rubs, no gallops Respiratory: Mostly clear.  Has some conducted upper airway sounds. Abdominal: Soft, NT, ND, bowel sounds + Extremities: no edema, no cyanosis    The results of significant diagnostics from this hospitalization (including imaging, microbiology, ancillary and laboratory) are listed below for reference.     Microbiology: Recent Results (from the past 240 hour(s))  Blood culture (routine single)     Status: None   Collection Time: 08/14/20 11:21 AM   Specimen: BLOOD  Result Value Ref Range Status   Specimen Description   Final    BLOOD RIGHT ANTECUBITAL Performed at [facility] [address] [place], [place] [address]    Special Requests   Final  BOTTLES DRAWN AEROBIC AND ANAEROBIC Blood Culture results may not be optimal due to an excessive volume of blood received in culture bottles Performed at [facility], [place] [address], [place], [place] [address]    Culture   Final    NO GROWTH 5 DAYS Performed at [facility], [place] [address], [place], [place] [address]    Report Status 08/19/2020 FINAL  Final  Culture, blood (single)     Status: None   Collection Time: 08/14/20 12:29 PM   Specimen: BLOOD  Result Value Ref Range Status   Specimen Description   Final    BLOOD BLOOD RIGHT FOREARM Performed at [facility] [address], [place], [place] [address]    Special Requests   Final    BOTTLES DRAWN AEROBIC AND ANAEROBIC Blood Culture adequate volume Performed at [facility] [address] [place], [place] [address]    Culture    Final    NO GROWTH 5 DAYS Performed at [facility], [place] [address], [place],  [address]    Report Status 08/19/2020 FINAL  Final  Resp Panel by RT-PCR (Flu A&B, Covid) Nasopharyngeal Swab     Status: None   Collection Time: 08/14/20  2:23 PM   Specimen: Nasopharyngeal Swab; Nasopharyngeal(NP) swabs in vial transport medium  Result Value Ref Range Status   SARS Coronavirus 2 by RT PCR NEGATIVE NEGATIVE Final    Comment: (NOTE) SARS-CoV-2 target nucleic acids are NOT DETECTED.  The SARS-CoV-2 RNA is generally detectable in upper respiratory specimens during the acute phase of infection. The lowest concentration of SARS-CoV-2 viral copies this assay can detect is 138 copies/mL. A negative result does not preclude SARS-Cov-2 infection and should not be used as the sole basis for treatment or other patient management decisions. A negative result may occur with  improper specimen collection/handling, submission of specimen other than nasopharyngeal swab, presence of viral mutation(s) within the areas targeted by this assay, and inadequate number of viral copies(<138 copies/mL). A negative result must be combined with clinical observations, patient history, and epidemiological information. The expected result is Negative.  Fact Sheet for Patients:  EntrepreneurPulse.com.au  Fact Sheet for Healthcare Providers:  IncredibleEmployment.be  This test is no t yet approved or cleared by the [place] FDA and  has been authorized for detection and/or diagnosis of SARS-CoV-2 by FDA under an Emergency Use Authorization (EUA). This EUA will remain  in effect (meaning this test can be used) for the duration of the COVID-19 declaration under Section 564(b)(1) of the Act, 21 U.S.C.section 360bbb-3(b)(1), unless the authorization is terminated  or revoked sooner.       Influenza A by PCR NEGATIVE NEGATIVE Final   Influenza B by PCR NEGATIVE  NEGATIVE Final    Comment: (NOTE) The Xpert Xpress SARS-CoV-2/FLU/RSV plus assay is intended as an aid in the diagnosis of influenza from Nasopharyngeal swab specimens and should not be used as a sole basis for treatment. Nasal washings and aspirates are unacceptable for Xpert Xpress SARS-CoV-2/FLU/RSV testing.  Fact Sheet for Patients: EntrepreneurPulse.com.au  Fact Sheet for Healthcare Providers: IncredibleEmployment.be  This test is not yet approved or cleared by the [place] FDA and has been authorized for detection and/or diagnosis of SARS-CoV-2 by FDA under an Emergency Use Authorization (EUA). This EUA will remain in effect (meaning this test can be used) for the duration of the COVID-19 declaration under Section 564(b)(1) of the Act, 21 U.S.C. section 360bbb-3(b)(1),  unless the authorization is terminated or revoked.  Performed at [facility], [place] [address], [place], [place] [address]   Urine Culture     Status: None   Collection Time: 08/15/20 12:51 PM   Specimen: In/Out Cath Urine  Result Value Ref Range Status   Specimen Description   Final    IN/OUT CATH URINE Performed at [facility] [address] [place], [place] [address]    Special Requests   Final    NONE Performed at [facility], [place] [address], [place], [place] [address]    Culture   Final    NO GROWTH Performed at [facility], [place] [address] [place] [address] [place], [place] [address]    Report Status 08/16/2020 FINAL  Final  MRSA Next Gen by PCR, Nasal     Status: None   Collection Time: 08/16/20  9:01 PM   Specimen: Nasal Mucosa; Nasal Swab  Result Value Ref Range Status   MRSA by PCR Next Gen NOT DETECTED NOT DETECTED Final    Comment: (NOTE) The GeneXpert MRSA Assay (FDA approved for NASAL specimens only), is one component of a comprehensive MRSA colonization surveillance program. It is not intended  to diagnose MRSA infection nor to guide or monitor treatment for MRSA infections. Test performance is not FDA approved in patients less than 66 years old. Performed at [facility], [place] [address], [place], [place] [address]   Culture, Respiratory w Gram Stain     Status: None   Collection Time: 08/17/20  9:25 AM   Specimen: Tracheal Aspirate  Result Value Ref Range Status   Specimen Description   Final    TRACHEAL ASPIRATE Performed at [facility] [address], [place], [place] [address]    Special Requests   Final    NONE Performed at [facility], [place] [address], [place], [place] [address]    Gram Stain   Final    RARE WBC PRESENT,BOTH PMN AND MONONUCLEAR RARE GRAM POSITIVE COCCI IN PAIRS Performed at [facility], [place] [address] [place], [place] [address]    Culture   Final    RARE STAPHYLOCOCCUS AUREUS RARE PSEUDOMONAS AERUGINOSA    Report Status 08/19/2020 FINAL  Final   Organism ID, Bacteria STAPHYLOCOCCUS AUREUS  Final   Organism ID, Bacteria PSEUDOMONAS AERUGINOSA  Final      Susceptibility   Pseudomonas aeruginosa - MIC*    CEFTAZIDIME <=1 SENSITIVE Sensitive     CIPROFLOXACIN <=0.25 SENSITIVE Sensitive     GENTAMICIN <=1 SENSITIVE Sensitive     IMIPENEM <=0.25 SENSITIVE Sensitive     PIP/TAZO <=4 SENSITIVE Sensitive     CEFEPIME 2 SENSITIVE Sensitive     * RARE PSEUDOMONAS AERUGINOSA   Staphylococcus aureus - MIC*    CIPROFLOXACIN <=0.5 SENSITIVE Sensitive     ERYTHROMYCIN <=0.25 SENSITIVE Sensitive     GENTAMICIN <=0.5 SENSITIVE Sensitive     OXACILLIN <=0.25 SENSITIVE Sensitive     TETRACYCLINE <=1 SENSITIVE Sensitive     VANCOMYCIN 1 SENSITIVE Sensitive     TRIMETH/SULFA <=10 SENSITIVE Sensitive     CLINDAMYCIN <=0.25 SENSITIVE Sensitive     RIFAMPIN <=0.5 SENSITIVE Sensitive     Inducible Clindamycin NEGATIVE Sensitive     * RARE STAPHYLOCOCCUS AUREUS     Labs: BNP (last 3  results) No results for input(s): BNP in the last 8760 hours. Basic Metabolic Panel: Recent Labs  Lab 08/17/20 0408  08/18/20 0221 08/19/20 0350 08/20/20 0454 08/21/20 0342  NA 136 138 140 140 137  K 3.8 3.8 3.4* 4.1 4.6  CL 103 102 104 108 104  CO2 '27 27 28 26 27  '$ GLUCOSE 102* 90 95 95 96  BUN 20 19 23* 24* 22*  CREATININE 0.52* 0.55* 0.49* 0.49* 0.39*  CALCIUM 8.5* 8.8* 8.6* 8.7* 8.8*  MG  --   --   --   --  1.9  PHOS  --   --   --   --  2.1*   Liver Function Tests: Recent Labs  Lab 08/14/20 1810 08/15/20 0240 08/18/20 0221 08/19/20 0350 08/20/20 0454  AST 40 50* 69* 70* 55*  ALT 124* 91* 95* 88* 78*  ALKPHOS 233* 208* 202* 178* 166*  BILITOT 2.1* 1.7* 0.7 0.6 0.5  PROT 6.8 5.4* 5.6* 5.5* 5.6*  ALBUMIN 2.3* 1.9* 1.7* 1.8* 1.8*   No results for input(s): LIPASE, AMYLASE in the last 168 hours. No results for input(s): AMMONIA in the last 168 hours. CBC: Recent Labs  Lab 08/15/20 0240 08/16/20 0350 08/17/20 0408 08/18/20 0221 08/19/20 0350 08/20/20 0454 08/21/20 0342  WBC 12.5*   < > 13.4* 12.6* 10.2 10.5 12.9*  NEUTROABS 11.8*  --   --   --   --   --  11.4*  HGB 8.4*   < > 8.5* 8.6* 8.2* 8.2* 8.5*  HCT 26.6*   < > 27.1* 27.7* 26.5* 26.5* 27.7*  MCV 94.3   < > 96.1 95.8 96.0 97.1 96.5  PLT 393   < > 441* 481* 495* 570* 569*   < > = values in this interval not displayed.   Cardiac Enzymes: No results for input(s): CKTOTAL, CKMB, CKMBINDEX, TROPONINI in the last 168 hours. BNP: Invalid input(s): POCBNP CBG: Recent Labs  Lab 08/14/20 2154  GLUCAP 156*   D-Dimer No results for input(s): DDIMER in the last 72 hours. Hgb A1c No results for input(s): HGBA1C in the last 72 hours. Lipid Profile No results for input(s): CHOL, HDL, LDLCALC, TRIG, CHOLHDL, LDLDIRECT in the last 72 hours. Thyroid function studies No results for input(s): TSH, T4TOTAL, T3FREE, THYROIDAB in the last 72 hours.  Invalid input(s): FREET3 Anemia work up Recent Labs     08/19/20 1447  VITAMINB12 1,223*  FOLATE 15.2  FERRITIN 1,356*  TIBC 174*  IRON 15*   Urinalysis No results found for: COLORURINE, APPEARANCEUR, LABSPEC, [place], GLUCOSEU, [place], [place], [place], [place], UROBILINOGEN, NITRITE, LEUKOCYTESUR Sepsis Labs Invalid input(s): PROCALCITONIN,  WBC,  LACTICIDVEN Microbiology Recent Results (from the past 240 hour(s))  Blood culture (routine single)     Status: None   Collection Time: 08/14/20 11:21 AM   Specimen: BLOOD  Result Value Ref Range Status   Specimen Description   Final    BLOOD RIGHT ANTECUBITAL Performed at [facility] [address] [place], [place] [address]    Special Requests   Final    BOTTLES DRAWN AEROBIC AND ANAEROBIC Blood Culture results may not be optimal due to an excessive volume of blood received in culture bottles Performed at [facility] [address], [place], [place] [address]    Culture   Final    NO GROWTH 5 DAYS Performed at [facility], [place] [address] [place], [place] [address]    Report Status 08/19/2020 FINAL  Final  Culture, blood (single)     Status: None   Collection Time: 08/14/20 12:29 PM   Specimen: BLOOD  Result Value Ref Range Status   Specimen Description   Final    BLOOD BLOOD RIGHT FOREARM Performed at [facility] [address] [place], [place] [address]    Special Requests   Final    BOTTLES DRAWN AEROBIC AND ANAEROBIC Blood Culture adequate volume Performed at [facility] [address] [place], [place] [address]    Culture   Final    NO GROWTH 5 DAYS Performed at [facility], [place] [address], [place], [place] [address]    Report Status 08/19/2020 FINAL  Final  Resp Panel by RT-PCR (Flu A&B, Covid) Nasopharyngeal Swab     Status: None   Collection Time: 08/14/20  2:23 PM   Specimen: Nasopharyngeal Swab; Nasopharyngeal(NP) swabs in vial transport medium  Result Value  Ref Range Status   SARS Coronavirus 2 by RT PCR NEGATIVE NEGATIVE Final    Comment: (NOTE) SARS-CoV-2 target nucleic acids are NOT DETECTED.  The SARS-CoV-2 RNA is generally detectable in upper respiratory specimens during the acute phase of infection. The lowest concentration of SARS-CoV-2 viral copies this assay can detect is 138 copies/mL. A negative result does not preclude SARS-Cov-2 infection and should not be used as the sole basis for treatment or other patient management decisions. A negative result may occur with  improper specimen collection/handling, submission of specimen other than nasopharyngeal swab, presence of viral mutation(s) within the areas targeted by this assay, and inadequate number of viral copies(<138 copies/mL). A negative result must be combined with clinical observations, patient history, and epidemiological information. The expected result is Negative.  Fact Sheet for Patients:  EntrepreneurPulse.com.au  Fact Sheet for Healthcare Providers:  IncredibleEmployment.be  This test is no t yet approved or cleared by the [place] FDA and  has been authorized for detection and/or diagnosis of SARS-CoV-2 by FDA under an Emergency Use Authorization (EUA). This EUA will remain  in effect (meaning this test can be used) for the duration of the COVID-19 declaration under Section 564(b)(1) of the Act, 21 U.S.C.section 360bbb-3(b)(1), unless the authorization is terminated  or revoked sooner.       Influenza A by PCR NEGATIVE NEGATIVE Final   Influenza B by PCR NEGATIVE NEGATIVE Final    Comment: (NOTE) The Xpert Xpress SARS-CoV-2/FLU/RSV plus assay is intended as an aid in the diagnosis of influenza from Nasopharyngeal swab specimens and should not be used as a sole basis for treatment. Nasal washings and aspirates are unacceptable for Xpert Xpress SARS-CoV-2/FLU/RSV testing.  Fact Sheet for  Patients: EntrepreneurPulse.com.au  Fact Sheet for Healthcare Providers: IncredibleEmployment.be  This test is not yet approved or cleared by the [place] FDA and has been authorized for detection and/or diagnosis of SARS-CoV-2 by FDA under an Emergency Use Authorization (EUA). This EUA will remain in effect (meaning this test can be used) for the duration of the COVID-19 declaration under Section 564(b)(1) of the Act, 21 U.S.C. section 360bbb-3(b)(1), unless the authorization is terminated or revoked.  Performed at [facility], [place] [address] [place], [place] [address]   Urine Culture     Status: None   Collection Time: 08/15/20 12:51 PM   Specimen: In/Out Cath Urine  Result Value Ref Range Status   Specimen Description   Final    IN/OUT CATH URINE Performed at [facility] [address], [place], [place] [address]    Special Requests   Final    NONE Performed at [facility],  [place] [address] [place], [place] [address]    Culture   Final    NO GROWTH Performed at [facility], [place] [address] [place], [place] [address]    Report Status 08/16/2020 FINAL  Final  MRSA Next Gen by PCR, Nasal     Status: None   Collection Time: 08/16/20  9:01 PM   Specimen: Nasal Mucosa; Nasal Swab  Result Value Ref Range Status   MRSA by PCR Next Gen NOT DETECTED NOT DETECTED Final    Comment: (NOTE) The GeneXpert MRSA Assay (FDA approved for NASAL specimens only), is one component of a comprehensive MRSA colonization surveillance program. It is not intended to diagnose MRSA infection nor to guide or monitor treatment for MRSA infections. Test performance is not FDA approved in patients less than 31 years old. Performed at [facility], [place] [address], [place], [place] [address]   Culture, Respiratory w Gram Stain     Status: None   Collection Time: 08/17/20  9:25  AM   Specimen: Tracheal Aspirate  Result Value Ref Range Status   Specimen Description   Final    TRACHEAL ASPIRATE Performed at [place] [address], [place], [place] [address]    Special Requests   Final    NONE Performed at [facility], [place] [address] [place], [place] [address]    Gram Stain   Final    RARE WBC PRESENT,BOTH PMN AND MONONUCLEAR RARE GRAM POSITIVE COCCI IN PAIRS Performed at [facility], [place] [address] [place], [place] [address]    Culture   Final    RARE STAPHYLOCOCCUS AUREUS RARE PSEUDOMONAS AERUGINOSA    Report Status 08/19/2020 FINAL  Final   Organism ID, Bacteria STAPHYLOCOCCUS AUREUS  Final   Organism ID, Bacteria PSEUDOMONAS AERUGINOSA  Final      Susceptibility   Pseudomonas aeruginosa - MIC*    CEFTAZIDIME <=1 SENSITIVE Sensitive     CIPROFLOXACIN <=0.25 SENSITIVE Sensitive     GENTAMICIN <=1 SENSITIVE Sensitive     IMIPENEM <=0.25 SENSITIVE Sensitive     PIP/TAZO <=4 SENSITIVE Sensitive     CEFEPIME 2 SENSITIVE Sensitive     * RARE PSEUDOMONAS AERUGINOSA   Staphylococcus aureus - MIC*    CIPROFLOXACIN <=0.5 SENSITIVE Sensitive     ERYTHROMYCIN <=0.25 SENSITIVE Sensitive     GENTAMICIN <=0.5 SENSITIVE Sensitive     OXACILLIN <=0.25 SENSITIVE Sensitive     TETRACYCLINE <=1 SENSITIVE Sensitive     VANCOMYCIN 1 SENSITIVE Sensitive     TRIMETH/SULFA <=10 SENSITIVE Sensitive     CLINDAMYCIN <=0.25 SENSITIVE Sensitive     RIFAMPIN <=0.5 SENSITIVE Sensitive     Inducible Clindamycin NEGATIVE Sensitive     * RARE STAPHYLOCOCCUS AUREUS     Time coordinating discharge:  35 minutes  SIGNED:   Barb Merino, MD  [facility] 08/21/2020, 1:03 PM

## 2020-08-21 NOTE — Evaluation (Signed)
Passy-Muir Speaking Valve - Evaluation Patient Details  Name: Sunny Dasinger MRN: [id number] Date of Birth: 1965/12/24  Today's Date: 08/21/2020 Time: [id number] SLP Time Calculation (min) (ACUTE ONLY): 12 min  Past Medical History:  Past Medical History:  Diagnosis Date   Coronary artery disease    Dyspnea    GERD (gastroesophageal reflux disease)    Phreesia 02/21/2020   Ruptured lumbar intervertebral disc    Past Surgical History:  Past Surgical History:  Procedure Laterality Date   CARDIAC CATHETERIZATION     with stent placement   IR GASTROSTOMY TUBE MOD SED  04/20/2020   IR IMAGING GUIDED PORT INSERTION  04/20/2020   TONSILLECTOMY     TRACHEOSTOMY TUBE PLACEMENT N/A 03/05/2020   Procedure: AWAKE TRACHEOSTOMY, DIRECT LARYNGOSCOPY WITH BIOPSY;  Surgeon: Melida Quitter, MD;  Location: [facility];  Service: ENT;  Laterality: N/A;   HPI:  55 year old male with medical history of laryngeal carcinoma s/p chemoradiation, s/p trahc and PEG, CAD, and GERD. He presented to the ED d/t fever and shortness of breath. CXR showed RUL PNA. CT chest showed necrotizing cavitary PNA. 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  Pt was encountered awake/alert in bed and was agreeable to this evaluation.  He reported that he has a few PMVs at home that he uses intermittently.  Per pt report, he had increased tracheal mucous following radiation which led to the [place] becoming more uncomfortable to wear.  He utilized mouthing words and writing for the majority of his communication in this evaluation.  Given that trach is cuffless, no cuff deflation trial.  During finger occlusion trial, patient achieved phonation with a hoarse vocal quality.  Some back pressure was observed. Pt independently donned PMV and again achieved hoarse phonation.  He tolerated the PMV for approximately  1 minute before doffing it independently.  PMV was donned again x2 for approximately 30 seconds each time.  Back pressure was observed after each trial.  Pt reported increased difficulty with respirations with PMV in place.  PMV was placed in teal container and left in the pt's room.  Given that he is well educated and experienced with using a PMV, patient may utilize [place] as he feels comfortable without supervision.  No further skilled ST warranted targeting PMV tolerance at this time.  SLP Visit Diagnosis: Aphonia (R49.1)    SLP Assessment  Patient does not need any further Speech Lanaguage Pathology Services    Follow Up Recommendations  Outpatient SLP if pt is interested otherwise none         PMSV Trial PMSV was placed for: 1 minute Able to redirect subglottic air through upper airway: Yes Able to Attain Phonation: Yes Voice Quality: Hoarse Able to Expectorate Secretions: No attempts Breath Support for Phonation: Mildly decreased Intelligibility: Intelligible   Tracheostomy Tube       Vent Dependency       Cuff Deflation Trial  GO  Colin Mulders., M.S., CCC-SLP Acute Rehabilitation Services Office: [phone]            Elvia Collum [facility] 08/21/2020, 11:40 AM

## 2020-08-21 NOTE — Progress Notes (Signed)
AVS given to patient and explained at the bedside. Medications and follow up appointments have been explained with pt verbalizing understanding.  

## 2020-08-21 NOTE — Plan of Care (Signed)
  Problem: Education: Goal: Knowledge of General Education information will improve Description: Including pain rating scale, medication(s)/side effects and non-pharmacologic comfort measures Outcome: Progressing   Problem: Coping: Goal: Level of anxiety will decrease Outcome: Progressing   Problem: Elimination: Goal: Will not experience complications related to urinary retention Outcome: Progressing   Problem: Pain Managment: Goal: General experience of comfort will improve Outcome: Progressing   Problem: Safety: Goal: Ability to remain free from injury will improve Outcome: Progressing   Problem: Skin Integrity: Goal: Risk for impaired skin integrity will decrease Outcome: Progressing   Problem: Respiratory: Goal: Ability to maintain adequate ventilation will improve Outcome: Progressing   

## 2020-08-21 NOTE — Progress Notes (Signed)
RT NOTE:  Pt refuses CPT at this time. States he wishes to sleep and he will do it next round.

## 2020-08-23 ENCOUNTER — Telehealth: Payer: Self-pay

## 2020-08-23 ENCOUNTER — Encounter: Payer: Self-pay | Admitting: Hematology and Oncology

## 2020-08-23 NOTE — Progress Notes (Signed)
                                                                                                                                                             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   Plan: The patient will follow-up with radiation oncology in 2-3 wks. -----------------------------------  Eppie Gibson, MD

## 2020-08-23 NOTE — Telephone Encounter (Signed)
Transition Care Management Unsuccessful Follow-up Telephone Call  Date of discharge and from where:  08/21/2020, [facility]   Attempts:  1st Attempt  Reason for unsuccessful TCM follow-up call:  Left voice message on # [phone].  Call also placed to # [phone], voicemail full, unable to leave a message

## 2020-08-24 ENCOUNTER — Telehealth: Payer: Self-pay

## 2020-08-24 NOTE — Telephone Encounter (Signed)
Transition Care Management Unsuccessful Follow-up Telephone Call  Date of discharge and from where:  08/21/2020, [facility]   Attempts:  2nd Attempt  Reason for unsuccessful TCM follow-up call:  Left voice message on # [phone].  Call also placed to # [phone], voicemail full, unable to leave a message

## 2020-08-25 ENCOUNTER — Telehealth: Payer: Self-pay

## 2020-08-25 NOTE — Telephone Encounter (Signed)
Transition Care Management Unsuccessful Follow-up Telephone Call  Date of discharge and from where:  08/21/2020, [facility]  Attempts:  3rd Attempt  Reason for unsuccessful TCM follow-up call:  Left voice messages on # [phone] and # [phone].  Letter also sent to patient requesting he contact Primary Care at [facility] to schedule an appointment.

## 2020-09-03 ENCOUNTER — Other Ambulatory Visit: Payer: Self-pay

## 2020-09-03 ENCOUNTER — Encounter: Payer: Self-pay | Admitting: Family

## 2020-09-03 ENCOUNTER — Ambulatory Visit (INDEPENDENT_AMBULATORY_CARE_PROVIDER_SITE_OTHER): Payer: 59 | Admitting: Family

## 2020-09-03 VITALS — BP 98/63 | HR 93 | Temp 97.3°F | Resp 22 | Ht 69.0 in | Wt 109.0 lb

## 2020-09-03 DIAGNOSIS — J189 Pneumonia, unspecified organism: Secondary | ICD-10-CM | POA: Diagnosis not present

## 2020-09-03 DIAGNOSIS — Z09 Encounter for follow-up examination after completed treatment for conditions other than malignant neoplasm: Secondary | ICD-10-CM | POA: Diagnosis not present

## 2020-09-03 DIAGNOSIS — C329 Malignant neoplasm of larynx, unspecified: Secondary | ICD-10-CM | POA: Diagnosis not present

## 2020-09-03 NOTE — Patient Instructions (Signed)
Keep taking your antibiotics Keep the appointment with your Oncologist Talk to your dietician about supplements that would help you for weight increase. Call or come back with any health concern or go to the local ED.

## 2020-09-03 NOTE — Progress Notes (Signed)
HFU- sepsis/ PNA  Some discomfort around tracheostomy

## 2020-09-03 NOTE — Progress Notes (Signed)
Erik [name], is a 55 y.o. male  [id number]  [id number]  DOB - [birth year]  Subjective:  Chief Complaint and HPI: Erik [name] is a 55 y.o. male who presents to the clinic this afternoon for hospital follow-up.  Patient was admitted to the local hospital on July 16 and discharged on July 23 this year.  He was diagnosed with sepsis as well as pneumonia of the right upper lobe.  Treated with antibiotics and discharged on July 23.  Patient is taking all his antibiotics as prescribed.  His symptoms have resolved, denies cough, fever, chills, headache, or any other symptoms.  Patient says he is concerned because he is not gaining any weight, he is working with his dietitian issue.  Has an upcoming appointment with his oncologist for his laryngeal carcinoma.  ED/Hospital notes reviewed.    ROS:   Constitutional:  No f/c, No night sweats, No unexplained weight loss. EENT:  No vision changes, No blurry vision, No hearing changes. No mouth, throat, or ear problems.  Respiratory: Has a trach. no cough, No SOB Cardiac: No CP, no palpitations GI:  No abd pain, No N/V/D.  Endocrine:  No polydipsia. No polyuria.  Psych: Denies SI/HI  No problems updated.  ALLERGIES: No Known Allergies  PAST MEDICAL HISTORY: Past Medical History:  Diagnosis Date   Coronary artery disease    Dyspnea    GERD (gastroesophageal reflux disease)    Phreesia 02/21/2020   Ruptured lumbar intervertebral disc     MEDICATIONS AT HOME: Prior to Admission medications   Medication Sig Start Date End Date Taking? Authorizing Provider  amoxicillin-clavulanate (AUGMENTIN) 250-62.5 MG/5ML suspension Place 15 mLs (750 mg total) into feeding tube 2 (two) times daily for 14 days. 08/21/20 09/04/20  Barb Merino, MD  levofloxacin (LEVAQUIN) 750 MG tablet 1 tablet (750 mg total) by Per J Tube route daily for 14 days. 08/21/20 09/04/20  Barb Merino, MD  lidocaine (XYLOCAINE) 2 % solution Patient: Mix 1part 2% viscous  lidocaine, 1part H20. Swallow 68m of diluted mixture, 227m before meals and at bedtime, up to QID 04/26/20   SqEppie GibsonMD  Nutritional Supplements (FEEDING SUPPLEMENT, OSMOLITE 1.5 CAL,) LIQD Give 1 1/2 cartons of tube feeding at 8am, noon, 4pm and 8pm via tube.  Flush with 6070mf water before and after each feeding.  Must drink or give via tube 16oz bottle water for additional hydration. 04/28/20   SquEppie GibsonD     Objective:  EXAM:   Vitals:   09/03/20 1344  BP: 98/63  Pulse: 93  Resp: (!) 22  Temp: (!) 97.3 F (36.3 C)  SpO2: 96%  Weight: 109 lb (49.4 kg)  Height: '5\' 9"'$  (1.753 m)    General appearance : A&OX3. NAD. Non-toxic-appearing HEENT: Atraumatic and Normocephalic.  PERRLA. EOM intact.  TM clear B. Mouth-MMM, post pharynx WNL w/o erythema, No PND. Has a Trach in place, no sign of complication. Neck: Trach intact Chest/Lungs:  Breathing-non-labored, Good air entry bilaterally, breath sounds normal without rales, rhonchi, or wheezing  CVS: S1 S2 regular, no murmurs, gallops, rubs  Abdomen: G-tube in place, no sign of complication . Not distended with no gaurding, rigidity or rebound.   Psych:  TP linear. J/I WNL. Normal speech. Appropriate eye contact and affect.    Data Review No results found for: HGBA1C   Assessment & Plan   1. Hospital discharge follow-up -Continue taking all your antibiotics as prescribed -Stay Dehydrated -Ports new or worsening symptoms to the clinic  or local ER  2. Laryngeal cancer [facility]) -Follow-up with your oncologist on September 14, 2020  3. Community acquired pneumonia of right upper lobe of lung -Take all the medications as prescribed -Finish taking all the antibiotics as scheduled     Patient have been counseled extensively about nutrition and exercise  No follow-ups on file.  The patient was given clear instructions to go to ER or return to medical center if symptoms don't improve, worsen or new problems develop. The  patient verbalized understanding. The patient was told to call to get lab results if they haven't heard anything in the next week.     Feliberto Gottron, APRN, FNP-C [facility] and [facility] [place], [place]   09/03/2020, 2:00 PM

## 2020-09-08 ENCOUNTER — Telehealth: Payer: Self-pay | Admitting: *Deleted

## 2020-09-08 NOTE — Telephone Encounter (Signed)
CALLED PATIENT TO INFORM OF PET SCAN ON 09-21-20 - ARRIVAL TIME- 7:30 AM @ [facility], PATIENT TO HAVE WATER ONLY - 6 HRS. PRIOR TO TEST, FU WITH DR. Isidore Moos ON 09-22-20 @ 11:40 AM FOR RESULTS, LVM FOR A RETURN CALL

## 2020-09-14 ENCOUNTER — Other Ambulatory Visit: Payer: Self-pay

## 2020-09-14 ENCOUNTER — Inpatient Hospital Stay: Payer: 59 | Attending: Hematology and Oncology | Admitting: Nutrition

## 2020-09-14 ENCOUNTER — Ambulatory Visit: Payer: 59 | Admitting: Radiation Oncology

## 2020-09-14 MED ORDER — OSMOLITE 1.5 CAL PO LIQD: ORAL | 12 refills | Status: AC

## 2020-09-14 NOTE — Progress Notes (Signed)
Nutrition follow-up completed with patient in office who has completed treatment for [place] cancer. Patient has had a recent hospital admission for sepsis and pneumonia of the right upper lobe.  Patient has a trach. Per speech pathology, patient is safe for thin liquids. Weight decreased to 116.8 pounds today August 16 down from 120.2 pounds July 5.  He attributes this to running out of Osmolite 1.5 and hospitalization. Reviewed recent order and noted orders reflect 6 cartons of Osmolite 1.5. Patient decided to supplement Osmolite 1.5 with equate.  Nutrition needs: 2200-2500 cal, 100-110 g protein, 2.5 L fluid.  7 cartons Osmolite 1.5 provides 2485 cal and 104.3 g protein.  Nutrition diagnosis: Unintended weight loss continues.  Intervention: Increase Osmolite 1.5 daily to 7 cartons split into 4 feedings.  Give 60 mL free water flushes before and after each bolus 4 times a day.  Give additional 16 ounces of water via PEG or by mouth as tolerated. New orders were written and [employer] [facility] was contacted.  Monitoring, evaluation, goals: Patient will tolerate increased tube feeding to promote weight gain. Continue thin liquids as desired.  Next visit: September 13.  **Disclaimer: This note was dictated with voice recognition software. Similar sounding words can inadvertently be transcribed and this note may contain transcription errors which may not have been corrected upon publication of note.**

## 2020-09-21 ENCOUNTER — Ambulatory Visit (HOSPITAL_COMMUNITY)
Admission: RE | Admit: 2020-09-21 | Discharge: 2020-09-21 | Disposition: A | Discharge status: Home / Self Care | Payer: 59 | Source: Ambulatory Visit | Attending: Radiation Oncology | Admitting: Radiation Oncology

## 2020-09-21 ENCOUNTER — Other Ambulatory Visit: Payer: Self-pay

## 2020-09-21 DIAGNOSIS — C329 Malignant neoplasm of larynx, unspecified: Secondary | ICD-10-CM | POA: Diagnosis not present

## 2020-09-21 LAB — GLUCOSE, CAPILLARY: Glucose-Capillary: 83 mg/dL (ref 70–99)

## 2020-09-21 MED ORDER — FLUDEOXYGLUCOSE F - 18 (FDG) INJECTION
6.0000 | Freq: Once | INTRAVENOUS | Status: AC
  Administered 2020-09-21: 5.76 via INTRAVENOUS

## 2020-09-22 ENCOUNTER — Telehealth: Payer: Self-pay | Admitting: Hematology and Oncology

## 2020-09-22 ENCOUNTER — Ambulatory Visit
Admission: RE | Admit: 2020-09-22 | Discharge: 2020-09-22 | Disposition: A | Discharge status: Home / Self Care | Payer: 59 | Source: Ambulatory Visit | Attending: Radiation Oncology | Admitting: Radiation Oncology

## 2020-09-22 ENCOUNTER — Encounter: Payer: Self-pay | Admitting: Radiation Oncology

## 2020-09-22 VITALS — BP 110/78 | HR 71 | Temp 97.5°F | Resp 18 | Ht 69.0 in | Wt 119.6 lb

## 2020-09-22 DIAGNOSIS — Z930 Tracheostomy status: Secondary | ICD-10-CM | POA: Diagnosis not present

## 2020-09-22 DIAGNOSIS — Z931 Gastrostomy status: Secondary | ICD-10-CM | POA: Diagnosis not present

## 2020-09-22 DIAGNOSIS — I700 Atherosclerosis of aorta: Secondary | ICD-10-CM | POA: Insufficient documentation

## 2020-09-22 DIAGNOSIS — I2510 Atherosclerotic heart disease of native coronary artery without angina pectoris: Secondary | ICD-10-CM | POA: Insufficient documentation

## 2020-09-22 DIAGNOSIS — C328 Malignant neoplasm of overlapping sites of larynx: Secondary | ICD-10-CM | POA: Diagnosis present

## 2020-09-22 DIAGNOSIS — Z923 Personal history of irradiation: Secondary | ICD-10-CM | POA: Insufficient documentation

## 2020-09-22 DIAGNOSIS — Z8521 Personal history of malignant neoplasm of larynx: Secondary | ICD-10-CM | POA: Insufficient documentation

## 2020-09-22 DIAGNOSIS — C329 Malignant neoplasm of larynx, unspecified: Secondary | ICD-10-CM

## 2020-09-22 HISTORY — DX: Pneumonia, unspecified organism: J18.9

## 2020-09-22 NOTE — Progress Notes (Signed)
Mr. Erik [name] presents today for follow-up after completing radiation to his supraglottis on 06/01/2020 and to review PET scan results from 09/21/2020  Pain issues, if any: Patient denies Using a feeding tube?: Yes: on 09/14/2020 per Dory Peru, RD: "Increase Osmolite 1.5 daily to 7 cartons split into 4 feedings.  Give 60 mL free water flushes before and after each bolus 4 times a day.  Give additional 16 ounces of water via PEG or by mouth as tolerated." Weight changes, if any:  Wt Readings from Last 3 Encounters:  09/22/20 119 lb 9.6 oz (54.3 kg)  09/14/20 116 lb 12.8 oz (53 kg)  09/03/20 109 lb (49.4 kg)   Swallowing issues, if any: Reports mild pain. Still receiving majority of nutrition via PEG Smoking or chewing tobacco? None Using fluoride trays daily? N/A Last ENT visit was on: Last saw Dr. Melida Quitter on 07/30/2020:  Procedure: Transnasal fiberoptic laryngoscopy --The fiberoptic laryngoscope was then placed through the nasal passage to view the pharynx and larynx. After completion, the telescope was removed. Findings included normal nasal passages, no mass or abnormality in the nasopharynx, and no mass or ulceration in the pharynx. The larynx is generally edematous with secretions making exam difficult. Impression & Plans:  --Erik [name] is a 55 y.o. male with history of laryngeal cancer and tracheostomy status. --There is no evidence of recurrence but his laryngeal exam is very limited. He will see Rad/Onc in August at the time of his PET scan. I will have him follow-up with me in October. His tracheostomy tube is critical at this time. I advised him not to neglect using the inner cannula.   Other notable issues, if any:   Vitals:   09/22/20 1203  BP: 110/78  Pulse: 71  Resp: 18  Temp: (!) 97.5 F (36.4 C)  SpO2: 100%

## 2020-09-22 NOTE — Telephone Encounter (Signed)
Rescheduled appointments to March per Dr. Pearlie Oyster orders.

## 2020-09-22 NOTE — Progress Notes (Signed)
Patient denies pain. Feeding tube- Yes Weight- Maintained Swallowing- Mild pain (trach) Smoking- No Fluoride trays- No Last ENT visit was on: Last saw Dr. Melida Quitter on 07/30/2020  BP 110/78 (BP Location: Right Arm, Patient Position: Sitting, Cuff Size: Small)   Pulse 71   Temp (!) 97.5 F (36.4 C) (Oral)   Resp 18   Ht '5\' 9"'$  (1.753 m)   Wt 119 lb 9.6 oz (54.3 kg)   SpO2 100%   BMI 17.66 kg/m

## 2020-09-26 ENCOUNTER — Encounter: Payer: Self-pay | Admitting: Radiation Oncology

## 2020-09-26 NOTE — Progress Notes (Signed)
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  Cancer Staging Laryngeal cancer [facility]) Staging form: Larynx - Supraglottis, AJCC 8th Edition - Clinical stage from 03/23/2020: Stage IVA (cT4a, cN1, cM0) - Signed by Eppie Gibson, MD on 04/09/2020 Stage prefix: Initial diagnosis   CHIEF COMPLAINT:  Here for follow-up and surveillance of throat cancer  Narrative:   Erik [name] presents today for follow-up after completing radiation to his supraglottis on 06/01/2020 and to review PET scan results from 09/21/2020  Pain issues, if any: Patient denies Using a feeding tube?: Yes: on 09/14/2020 per Dory Peru, RD: "Increase Osmolite 1.5 daily to 7 cartons split into 4 feedings.  Give 60 mL free water flushes before and after each bolus 4 times a day.  Give additional 16 ounces of water via PEG or by mouth as tolerated." Weight changes, if any:  Wt Readings from Last 3 Encounters:  09/22/20 119 lb 9.6 oz (54.3 kg)  09/14/20 116 lb 12.8 oz (53 kg)  09/03/20 109 lb (49.4 kg)   Swallowing issues, if any: Reports mild pain. Still receiving majority of nutrition via PEG. LOST to f/u w SLP Smoking or chewing tobacco? None Using fluoride trays daily? N/A Last ENT visit was on: Last saw Dr. Melida Quitter on 07/30/2020:  Procedure: Transnasal fiberoptic laryngoscopy --The fiberoptic laryngoscope was then placed through the nasal passage to view the pharynx and larynx. After completion, the telescope was removed. Findings included normal nasal passages, no mass or abnormality in the nasopharynx, and no mass or ulceration in the pharynx. The larynx is generally edematous with secretions making exam difficult. Impression & Plans:  --Erik [name] is a 55 y.o. male with history of  laryngeal cancer and tracheostomy status. --There is no evidence of recurrence but his laryngeal exam is very limited. He will see Rad/Onc in August at the time of his PET scan. I will have him follow-up with me in October. His tracheostomy tube is critical at this time. I advised him not to neglect using the inner cannula.   Other notable issues, if any: PET images reviewed by me with concern for progressive disease at caudal aspect of tracheostomy site  Vitals:   09/22/20 1203  BP: 110/78  Pulse: 71  Resp: 18  Temp: (!) 97.5 F (36.4 C)  SpO2: 100%                      ALLERGIES:  has No Known Allergies.  Meds: Current Outpatient Medications  Medication Sig Dispense Refill   Nutritional Supplements (FEEDING SUPPLEMENT, OSMOLITE 1.5 CAL,) LIQD Give 7 cartons of Osmolite 1.5 daily via PEG split into 4 feedings.  Give 60 mL free water flushes before and after bolus feedings with an additional 16 ounces of water via PEG or mouth as tolerated.  This provides 2485 cal and 104.3 g of protein meeting 100% calorie and protein needs. 1659 mL 12   lidocaine (XYLOCAINE) 2 % solution Patient: Mix 1part 2% viscous lidocaine, 1part H20. Swallow 8m of diluted mixture, 266m before meals and at bedtime, up to QID (Patient not taking: Reported on 09/22/2020) 200 mL 4   No current facility-administered medications for this encounter.    Physical Findings: The patient is in no acute  distress. Patient is alert and oriented. Wt Readings from Last 3 Encounters:  09/22/20 119 lb 9.6 oz (54.3 kg)  09/14/20 116 lb 12.8 oz (53 kg)  09/03/20 109 lb (49.4 kg)    height is '5\' 9"'$  (1.753 m) and weight is 119 lb 9.6 oz (54.3 kg). His oral temperature is 97.5 F (36.4 C) (abnormal). His blood pressure is 110/78 and his pulse is 71. His respiration is 18 and oxygen saturation is 100%. .  General: Alert and oriented, in no acute distress HEENT: Head is normocephalic. Extraocular movements are intact.  Oropharynx/oral cavity w/out active mucositis or thrush visible on physical exam. Excessive saliva has resolved Neck: no adenopathy but there is subcutaneous nodularity at caudal aspect of trach site concerning for progressive disease. See photo HEART RRR CHEST CTAB Psychiatric: Judgment and insight are intact. Affect is appropriate.   Lab Findings: Lab Results  Component Value Date   WBC 12.9 (H) 08/21/2020   HGB 8.5 (L) 08/21/2020   HCT 27.7 (L) 08/21/2020   MCV 96.5 08/21/2020   PLT 569 (H) 08/21/2020    Lab Results  Component Value Date   TSH 1.605 03/23/2020    Radiographic Findings: NM PET Image Restag (PS) Skull Base To Thigh  Result Date: 09/21/2020 CLINICAL DATA:  Subsequent treatment strategy for head neck cancer surveillance in a 55 year old male. EXAM: NUCLEAR MEDICINE PET SKULL BASE TO THIGH TECHNIQUE: 5.76 mCi F-18 FDG was injected intravenously. Full-ring PET imaging was performed from the skull base to thigh after the radiotracer. CT data was obtained and used for attenuation correction and anatomic localization. Fasting blood glucose: 83 mg/dl COMPARISON:  Comparison made with chest CT from July of 2022 with PET exam from March of 2022. FINDINGS: Mediastinal blood pool activity: SUV max 1.6 Liver activity: SUV max NA NECK: (Image 50/4) 2.6 x 2.9 cm soft tissue density immediately below the patient's tracheostomy tube showing a maximum SUV of 16.9. No hypermetabolic lymph nodes in the neck. Airway narrowing above this location is perhaps slightly improved. Metabolic activity above the tracheostomy tube is improved. Approximately 3.4 for maximum SUV as compared to 17.3 on the prior study. Incidental CT findings: none CHEST: Area in the RIGHT upper lobe shows marked cavitation. There is bronchiectatic change as well with thick rind. This was not present on the CT from the PET exam of March but with seen on recent imaging of the chest obtained in August. The process has  diminished in size considerably affecting less of the upper lobe when compared to the prior exam. For example on image 20 of series 8 this measures 3.7 x 2.7 cm as compared to 5.5 x 3.9 cm. This filled the RIGHT lung apex on the prior study and still at the most cephalad aspect of the RIGHT lung apex does extend to the pleural surface. Suspect volume loss in this area with further organization of cavitary foci. This measured approximately 9 x 8.7 cm in greatest axial dimension on the prior study. Greatest axial dimension currently approximately 6.7 x 7.1 cm with less pronounced upper lobe involvement than on the prior exam. Maximum SUV in this location is however 11.3. Tree-in-bud opacities are seen in aerated lung in the posterior RIGHT chest. There are no additional suspicious pulmonary findings. No signs of nodal disease in the chest Incidental CT findings: Calcified atheromatous plaque in the thoracic aorta. Normal caliber of the central pulmonary vessels. The three-vessel coronary artery disease. No substantial pericardial effusion. ABDOMEN/PELVIS: No abnormal hypermetabolic activity  within the liver, pancreas, adrenal glands, or spleen. No hypermetabolic lymph nodes in the abdomen or pelvis. Incidental CT findings: G-tube in situ. Aortic atherosclerosis without aneurysm. SKELETON: No focal hypermetabolic activity to suggest skeletal metastasis. Incidental CT findings: none IMPRESSION: Interval decreased size of a mass in the subglottic airway with diminished FDG uptake but with soft tissue density along the inferior margin of the patient's tracheostomy tube that displays increased metabolic activity. Correlation with direct clinical inspection, focused ultrasound or CT of the neck may be helpful for further evaluation. Findings are suspicious for disease recurrence. Improving appearance of cavitary changes in consolidative changes in the RIGHT upper lobe still but with marked hypermetabolic activity on the  current FDG PET evaluation. Findings more likely reflect resolving necrotic pneumonia, given findings above would suggest close attention on follow-up however. Electronically Signed   By: Zetta Bills M.D.   On: 09/21/2020 21:26    Impression/Plan:    1) Head and Neck Cancer Status: concern for progressive disease at tracheostomy site - see photo above. I spoke w/ Dr Redmond Baseman who will work him in quickly for bx.  Anderson Malta RN will arrange [phone] ENT (in [place]) appt in case it's needed for consideration of salvage surgery.  2) Nutritional Status: Maintaining his weight, follow w/ nutrition PEG tube: Continue using for supplementation and advance oral diet as tolerated under the supervision of speech-language pathology. See #4  Wt Readings from Last 3 Encounters:  09/22/20 119 lb 9.6 oz (54.3 kg)  09/14/20 116 lb 12.8 oz (53 kg)  09/03/20 109 lb (49.4 kg)   3) Risk Factors: The patient has been educated about risk factors including alcohol and tobacco abuse; they understand that avoidance of alcohol and tobacco is important to prevent recurrences as well as other cancers, he is abstaining from tobacco    4) Swallowing: Continue advancing diet as supervised by speech-language pathology (lost to f/u, I asked Anderson Malta to re schedule)  5) Dental: Encouraged to continue regular followup with dentistry, and dental hygiene including fluoride rinses.  I asked Anderson Malta to ensure he is scheduled w/ Dr Benson Norway for post tx exam  6) Thyroid function: Check annually with medical oncology Lab Results  Component Value Date   TSH 1.605 03/23/2020   7) Other: Follow-up in 33mo sooner if needed. Prioritize ENT visits as above.  On date of service, in total, I spent 30 minutes on this encounter. Patient was seen in person. _____________________________________   SEppie Gibson MD

## 2020-09-30 ENCOUNTER — Ambulatory Visit: Payer: 59 | Attending: Radiation Oncology

## 2020-09-30 ENCOUNTER — Other Ambulatory Visit: Payer: Self-pay

## 2020-09-30 DIAGNOSIS — R491 Aphonia: Secondary | ICD-10-CM | POA: Diagnosis present

## 2020-09-30 DIAGNOSIS — R1310 Dysphagia, unspecified: Secondary | ICD-10-CM | POA: Insufficient documentation

## 2020-10-01 ENCOUNTER — Other Ambulatory Visit: Payer: Self-pay

## 2020-10-01 DIAGNOSIS — C329 Malignant neoplasm of larynx, unspecified: Secondary | ICD-10-CM

## 2020-10-01 NOTE — Therapy (Signed)
[facility] [address] [place], [place], [address] Phone: [phone]   Fax:  [phone]  Speech Language Pathology Treatment/Renewal Summary  Patient Details  Name: Erik [name] MRN: [id number] Date of Birth: 1965/11/23 Referring Provider (SLP): Eppie Gibson, MD   Encounter Date: 09/30/2020   End of Session - 10/01/20 1119     Visit Number 3    Number of Visits 7    Date for SLP Re-Evaluation 12/30/20    SLP Start Time 1105    SLP Stop Time  1145    SLP Time Calculation (min) 40 min    Activity Tolerance Patient tolerated treatment well             Past Medical History:  Diagnosis Date   Coronary artery disease    Dyspnea    GERD (gastroesophageal reflux disease)    Phreesia 02/21/2020   Pneumonia    Ruptured lumbar intervertebral disc     Past Surgical History:  Procedure Laterality Date   CARDIAC CATHETERIZATION     with stent placement   IR GASTROSTOMY TUBE MOD SED  04/20/2020   IR IMAGING GUIDED PORT INSERTION  04/20/2020   TONSILLECTOMY     TRACHEOSTOMY TUBE PLACEMENT N/A 03/05/2020   Procedure: AWAKE TRACHEOSTOMY, DIRECT LARYNGOSCOPY WITH BIOPSY;  Surgeon: Melida Quitter, MD;  Location: [facility];  Service: ENT;  Laterality: N/A;    There were no vitals filed for this visit.   Subjective Assessment - 09/30/20 1138     Subjective Pt admitted in July with PNA and sepsis. Bedside swallow assessment at that time said thin liquids ok.    Currently in Pain? No/denies                   ADULT SLP TREATMENT - 10/01/20 1114       General Information   Behavior/Cognition Alert;Cooperative;Pleasant mood      Treatment Provided   Treatment provided Dysphagia      Dysphagia Treatment   Respiratory Status Trach    Treatment Methods Skilled observation;Therapeutic exercise    Patient observed directly with PO's Yes    Type of PO's observed Dysphagia 1 (puree);Thin liquids    Liquids  provided via Cup    Oral Phase Signs & Symptoms --   none   Pharyngeal Phase Signs & Symptoms Delayed throat clear;Other (comment)   pt spontaneously put chin down (not tucked) due to pt report of coughing if he does not do so   Other treatment/comments Pt has eaten wider variety of foods for approx last 2 weeks, and is spontaneously using chin down (not all the way tucked) posture due to "I'll start choking if I don't do it." Pt has not been completing HEP at all since last ST session in June. Told pt if he wanted to have *H2O only* until his (recommended) modified that he has to complete meticulous oral care just prior to PO water, and SLP explained rationale for this. Pt demo'd understanding. SLP recommends modified barium swallow eval to assess the possibility of aspiration with POs, currently, as this might have caused pt's PNA in July. Dyan req'd min A rarely with HEP procedure (supersupraglottic). With his spontaneous chin down posture pt only had delayed throat clear once in 12 swallows.      Assessment / Recommendations / Plan   Plan Consult other service (comment);New goals to be determined pending instrumental study   MBSS recommended, and f/u TBD after [facility] consult re: possible  sx for recurrence of cancer near trach site     Progression Toward Goals   Progression toward goals Progressing toward goals              SLP Education - 10/01/20 1117     Education Details need to have water after thorough oral care, described thorough oral care, supersupraglottic procedure, need to do HEP BID (start QD then work your way up to BID)    Person(s) Educated Patient    Methods Explanation;Demonstration;Verbal cues;Handout    Comprehension Verbalized understanding;Returned demonstration;Verbal cues required;Need further instruction              SLP Short Term Goals - 10/01/20 1126       SLP SHORT TERM GOAL #1   Title pt will complete HEP with rare min A    Period --   visits,  for all STGs   Status Not Met      SLP SHORT TERM GOAL #2   Title pt will tell SLP why pt is completing HEP with modified independence    Status Partially Met      SLP SHORT TERM GOAL #3   Title pt will describe 3 overt s/s aspiration PNA with modified independence    Status Not Met      SLP SHORT TERM GOAL #4   Title pt will tell SLP how a food journal could hasten return to a more normalized diet    Status Deferred              SLP Long Term Goals - 10/01/20 1126       SLP LONG TERM GOAL #1   Title pt will complete HEP with modified independence over two visits    Time 2    Period --   visits, for all LTGs   Status On-going      SLP LONG TERM GOAL #2   Title pt will describe how to modify HEP over time, over two sessions    Time 3    Status On-going      SLP LONG TERM GOAL #3   Title pt will describe the timeline associated with reduction in HEP frequency with modified independence    Time 3    Status On-going      SLP LONG TERM GOAL #4   Title pt will tell SLP 3 overt s/sx aspiration PNA    Time 3    Status New              Plan - 10/01/20 1119     Clinical Impression Statement At this time pt swallowing is deemed [facility] with water after thorough/meticulous oral care. Pt with bedside swallow assessment while inaptient due to sepsis and PNA in July but no objective study completed. Question today about recurrence of cancer at trach site - pt had biopsy 09-24-20; results pending, and consult arranged with surgeon at [facility] for one-two weeks. SLP reviewed the individualized HEP for dysphagia and pt completed each exercise on their own with modified independence except for supersupraglottic which req'd SLP cues. Pt has not completed HEP with frequency recommended by SLP. At this time, Suyash has incr'd variety of food he is eating - see "other commnets" for details re: PO recommendation and other details of today's session. There are no overt s/s aspiration PNA reported  by pt at this time, however becuase pt was hospitalized for PNA and he feels need to perform spontaneous chin down posture to mitigate coughing during  meals, MBSS is recommended. Data indicate that pt's swallow ability will likely decrease over the course of chemoradiation therapy and could very well decline over time following conclusion of their chemoradiation therapy due to muscle disuse atrophy and/or muscle fibrosis. Pt will cont to need to be seen by SLP in order to assess safety of PO intake, assess the need for recommending any objective swallow assessment, and ensuring pt correctly completes the individualized HEP.    Speech Therapy Frequency --   once approx every 4 weeks   Duration --   7 total visits   Treatment/Interventions Aspiration precaution training;Pharyngeal strengthening exercises;Diet toleration management by SLP;Patient/family education;Trials of upgraded texture/liquids;SLP instruction and feedback;Compensatory strategies    Potential to Achieve Goals Good    SLP Home Exercise Plan provided    Consulted and Agree with Plan of Care Patient             Patient will benefit from skilled therapeutic intervention in order to improve the following deficits and impairments:   Dysphagia, unspecified type - Plan: SLP plan of care cert/re-cert  [place] - Plan: SLP plan of care cert/re-cert    Problem List Patient Active Problem List   Diagnosis Date Noted   Protein-calorie malnutrition, severe 08/21/2020   Acute respiratory failure ([place]) 08/15/2020   Sepsis ([place]) 08/14/2020   CAP (community acquired pneumonia) 08/14/2020   Port-A-Cath in place 08/03/2020   Mucositis due to antineoplastic therapy 05/06/2020   Chemotherapy-induced nausea 04/28/2020   Weight loss, unintentional 04/28/2020   Dysgeusia 04/28/2020   G tube feedings ([place]) 04/28/2020   Nicotine abuse 04/21/2020   Head and neck cancer ([place])    History of tracheostomy 03/16/2020   Malnutrition of moderate  degree 03/05/2020   Laryngeal cancer ([place]) 03/04/2020    [facility] 10/01/2020, 11:28 AM  [facility] [address] [place], [place], [address] Phone: [phone]   Fax:  [phone]   Name: Hebert Erik [name] MRN: [id number] Date of Birth: 07-Oct-1965

## 2020-10-01 NOTE — Patient Instructions (Signed)
  You may have water only, within 30 minutes of very thorough brushing and cleaning of your mouth.

## 2020-10-12 ENCOUNTER — Inpatient Hospital Stay: Payer: 59 | Attending: Hematology and Oncology | Admitting: Nutrition

## 2020-10-12 ENCOUNTER — Other Ambulatory Visit: Payer: Self-pay

## 2020-10-12 NOTE — Progress Notes (Signed)
Brief nutrition follow-up completed with patient.  Patient has completed treatment for [place] cancer.  Weight improved and was documented as 126.6 pounds September 13, increased from 119.6 pounds August 24. Patient is giving 7 cartons of Osmolite 1.5 via feeding tube every day with 60 mL free water flush before and after boluses 4 times daily.  He also gives an additional 16 ounces of water via PEG.  Patient denies nausea, vomiting, constipation, or diarrhea.  He is scheduled for an MBS to determine oral diet.  He has no questions or concerns.  Estimated nutrition needs: 2200-2500 cal, 100-110 g protein, 2.5 L fluid.  Nutrition diagnosis: Unintended weight loss improved.  Intervention: Continue Osmolite 1.5, 7 cartons daily.'s give 60 mL free water flushes before and after bolus feedings 4 times a day.  Give additional 16 ounces of water via PEG every day.  Hold oral diet until after MBS recommendations are made.  7 cartons Osmolite 1.5 provides 2485 cal, 104.3 g protein.  Monitoring, Evaluation, goals: Patient will continue to tolerate tube feedings to promote weight gain.  Next visit: To be scheduled as needed.  **Disclaimer: This note was dictated with voice recognition software. Similar sounding words can inadvertently be transcribed and this note may contain transcription errors which may not have been corrected upon publication of note.**

## 2020-10-14 ENCOUNTER — Ambulatory Visit (HOSPITAL_COMMUNITY)
Admission: RE | Admit: 2020-10-14 | Discharge: 2020-10-14 | Disposition: A | Discharge status: Home / Self Care | Payer: 59 | Source: Ambulatory Visit | Attending: Radiation Oncology | Admitting: Radiation Oncology

## 2020-10-14 ENCOUNTER — Other Ambulatory Visit: Payer: Self-pay

## 2020-10-14 DIAGNOSIS — C329 Malignant neoplasm of larynx, unspecified: Secondary | ICD-10-CM | POA: Insufficient documentation

## 2020-10-15 ENCOUNTER — Encounter: Payer: Self-pay | Admitting: Radiation Oncology

## 2020-10-15 NOTE — Progress Notes (Signed)
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 Consider GI referral for potential cervical esophageal dilatation.   Given that Erik [name] may undergo salvage surgery in 2 wks I will hold off on GI referral for now, and consider this once he recovers.  -----------------------------------  Eppie Gibson, MD

## 2020-12-02 ENCOUNTER — Emergency Department (HOSPITAL_COMMUNITY)
Admission: EM | Admit: 2020-12-02 | Discharge: 2020-12-02 | Disposition: A | Discharge status: Other Acute Inpatient Hospital | Payer: 59 | Attending: Emergency Medicine | Admitting: Emergency Medicine

## 2020-12-02 ENCOUNTER — Encounter (HOSPITAL_COMMUNITY): Payer: Self-pay | Admitting: Emergency Medicine

## 2020-12-02 DIAGNOSIS — T8131XA Disruption of external operation (surgical) wound, not elsewhere classified, initial encounter: Secondary | ICD-10-CM | POA: Insufficient documentation

## 2020-12-02 DIAGNOSIS — I2510 Atherosclerotic heart disease of native coronary artery without angina pectoris: Secondary | ICD-10-CM | POA: Insufficient documentation

## 2020-12-02 DIAGNOSIS — Z87891 Personal history of nicotine dependence: Secondary | ICD-10-CM | POA: Insufficient documentation

## 2020-12-02 DIAGNOSIS — Z8521 Personal history of malignant neoplasm of larynx: Secondary | ICD-10-CM | POA: Insufficient documentation

## 2020-12-02 DIAGNOSIS — T8130XA Disruption of wound, unspecified, initial encounter: Secondary | ICD-10-CM

## 2020-12-02 DIAGNOSIS — Y848 Other medical procedures as the cause of abnormal reaction of the patient, or of later complication, without mention of misadventure at the time of the procedure: Secondary | ICD-10-CM | POA: Insufficient documentation

## 2020-12-02 NOTE — ED Notes (Signed)
Pt to go to [facility] ED per Dr. Karle Starch. Pt leaving POV per pt wife. Wet to dry dressing applied to pt wound per Dr. Karle Starch.

## 2020-12-02 NOTE — ED Triage Notes (Signed)
Per pt/wife, states he had staple removed from side of upper left leg a couple of days ago-states he bumped his leg at work today which caused site to open up-states no purulent drainage, bleeding controlled at this time

## 2020-12-02 NOTE — ED Provider Notes (Signed)
[facility] EMERGENCY DEPARTMENT Provider Note  CSN: [id number] Arrival date & time: 12/02/20 1812    History Chief Complaint  Patient presents with   Wound Dehiscence    Erik [name] is a 54 y.o. male with history of recurrent laryngeal cancer, s/p radical neck dissection with muscle flap from L thigh. He had his staples removed from his thigh wound about 2 weeks ago. Today around 4:30pm he hit his leg and the wound opened up. Bleeding some initially but now controlled. No significant pain.    Past Medical History:  Diagnosis Date   Coronary artery disease    Dyspnea    GERD (gastroesophageal reflux disease)    Phreesia 02/21/2020   Pneumonia    Ruptured lumbar intervertebral disc     Past Surgical History:  Procedure Laterality Date   CARDIAC CATHETERIZATION     with stent placement   IR GASTROSTOMY TUBE MOD SED  04/20/2020   IR IMAGING GUIDED PORT INSERTION  04/20/2020   TONSILLECTOMY     TRACHEOSTOMY TUBE PLACEMENT N/A 03/05/2020   Procedure: AWAKE TRACHEOSTOMY, DIRECT LARYNGOSCOPY WITH BIOPSY;  Surgeon: Melida Quitter, MD;  Location: [facility];  Service: ENT;  Laterality: N/A;    No family history on file.  Social History   Tobacco Use   Smoking status: Former    Types: Cigarettes    Quit date: 03/30/2020    Years since quitting: 0.6   Smokeless tobacco: Former    Types: Nurse, children's Use: Former  Substance Use Topics   Alcohol use: No   Drug use: No     Home Medications Prior to Admission medications   Medication Sig Start Date End Date Taking? Authorizing Provider  lidocaine (XYLOCAINE) 2 % solution Patient: Mix 1part 2% viscous lidocaine, 1part H20. Swallow 11mL of diluted mixture, 44min before meals and at bedtime, up to QID Patient not taking: Reported on 09/22/2020 04/26/20   Eppie Gibson, MD  Nutritional Supplements (FEEDING SUPPLEMENT, OSMOLITE 1.5 CAL,) LIQD Give 7 cartons of Osmolite 1.5 daily via PEG split into 4 feedings.  Give 60 mL  free water flushes before and after bolus feedings with an additional 16 ounces of water via PEG or mouth as tolerated.  This provides 2485 cal and 104.3 g of protein meeting 100% calorie and protein needs. 09/14/20   Eppie Gibson, MD     Allergies    Patient has no known allergies.   Review of Systems   Review of Systems A comprehensive review of systems was completed and negative except as noted in HPI.    Physical Exam BP 121/79   Pulse 80   Temp 98.4 F (36.9 C) (Oral)   Resp 18   SpO2 99%   Physical Exam Vitals and nursing note reviewed.  HENT:     Head: Normocephalic.     Nose: Nose normal.  Eyes:     Extraocular Movements: Extraocular movements intact.  Neck:     Comments: Tracheostomy in place Pulmonary:     Effort: Pulmonary effort is normal.  Musculoskeletal:        General: Normal range of motion.     Cervical back: Neck supple.  Skin:    Findings: No rash (on exposed skin).     Comments: Wound dehiscence of L thigh approx 10cm with visible muscle  Neurological:     Mental Status: He is alert and oriented to person, place, and time.  Psychiatric:  Mood and Affect: Mood normal.      ED Results / Procedures / Treatments   Labs (all labs ordered are listed, but only abnormal results are displayed) Labs Reviewed - No data to display  EKG None  Radiology No results found.  Procedures Procedures  Medications Ordered in the ED Medications - No data to display   MDM Rules/Calculators/A&P MDM  Wound dehiscence with exposed muscle. Will discuss management with his surgeon at [facility].    ED Course  I have reviewed the triage vital signs and the nursing notes.  Pertinent labs & imaging results that were available during my care of the patient were reviewed by me and considered in my medical decision making (see chart for details).  Clinical Course as of 12/02/20 2050  Thu Dec 02, 2020  2029 Spoke with Dr. Ilda Foil, ENT at [facility] who has  discussed with the patient's surgeon. They would like for him to come to the [facility] ED for evaluation. Offered ambulance transport, but wife would rather drive him herself. Patient will have a wet to dry dressing placed prior to transfer.  [CS]    Clinical Course User Index [CS] Truddie Hidden, MD    Final Clinical Impression(s) / ED Diagnoses Final diagnoses:  Wound dehiscence    Rx / DC Orders ED Discharge Orders     None        Truddie Hidden, MD 12/02/20 2050

## 2020-12-09 ENCOUNTER — Telehealth: Payer: Self-pay | Admitting: Dietician

## 2020-12-09 ENCOUNTER — Encounter: Payer: 59 | Admitting: Dietician

## 2020-12-09 ENCOUNTER — Telehealth: Payer: Self-pay | Admitting: Hematology and Oncology

## 2020-12-09 NOTE — Telephone Encounter (Signed)
Nutrition Follow-up:  Patient has completed concurrent chemoradiation therapy for larynex cancer. He is s/p laryngectomy with anterolateral free flap on 9/26 at [facility]. Patient is using EL tube for communication.  Noted recent [facility] admit 11/3-11/4 for wound dehiscence at left thigh ALT site. He was taken to OR for wound closure.   Spoke with patient via telephone. He reports PEG has been removed. He is tolerating regular diet, has a good appetite, "eating whatever I want." Patient reports he is maintaining his weight. He denies nausea, vomiting, diarrhea, constipation.   Anthropometrics: Per chart, last weight 125 lb on 10/27 at post-op visit. Patient weighed 126.6 lb on 9/13 increased from 119.6 lb on 8/24.   NUTRITION DIAGNOSIS: Unintended weight loss appears stable   INTERVENTION:  Encouraged high calorie, high protein foods for weight maintenance Encouraged good sources of protein with meals and snacks to promote healing     MONITORING, EVALUATION, GOAL: weight trends, intake   NEXT VISIT: To be scheduled as needed

## 2020-12-09 NOTE — Telephone Encounter (Signed)
Scheduled per sch msg. Called and left msg  

## 2020-12-14 ENCOUNTER — Inpatient Hospital Stay: Payer: 59 | Attending: Hematology and Oncology

## 2020-12-14 ENCOUNTER — Other Ambulatory Visit: Payer: Self-pay

## 2020-12-14 DIAGNOSIS — Z95828 Presence of other vascular implants and grafts: Secondary | ICD-10-CM

## 2020-12-14 DIAGNOSIS — Z452 Encounter for adjustment and management of vascular access device: Secondary | ICD-10-CM | POA: Diagnosis present

## 2020-12-14 DIAGNOSIS — C321 Malignant neoplasm of supraglottis: Secondary | ICD-10-CM | POA: Insufficient documentation

## 2020-12-14 MED ORDER — SODIUM CHLORIDE 0.9% FLUSH
10.0000 mL | Freq: Once | INTRAVENOUS | Status: AC
  Administered 2020-12-14: 10 mL

## 2020-12-14 MED ORDER — HEPARIN SOD (PORK) LOCK FLUSH 100 UNIT/ML IV SOLN
500.0000 [IU] | Freq: Once | INTRAVENOUS | Status: AC
  Administered 2020-12-14: 500 [IU]

## 2021-01-04 ENCOUNTER — Ambulatory Visit: Payer: 59 | Admitting: Hematology and Oncology

## 2021-01-04 ENCOUNTER — Other Ambulatory Visit: Payer: 59

## 2021-01-13 NOTE — Progress Notes (Signed)
Mr. Erik [name] presents today for follow-up after completing radiation to his supraglottis on 06/01/2020  Pain issues, if any: Patient denies Using a feeding tube?: Currently just flushing with water daily (reports he's not instilling Osmolite) Weight changes, if any:  Wt Readings from Last 3 Encounters:  01/14/21 122 lb 6 oz (55.5 kg)  10/12/20 126 lb 9.6 oz (57.4 kg)  09/22/20 119 lb 9.6 oz (54.3 kg)   Swallowing issues, if any: Patient denies--feels he's able to eat/drink without all nutrition orally without difficulty Smoking or chewing tobacco? None  Using fluoride trays daily? N/A Last ENT visit was on: 11/25/2020 Saw Dr. Harriett Rush [place]:  1. [phone] SCC of the larynx  - patient was treated for a cT4N1M0 SCC of the larynx with chemoXRT COT on 05/2020 - he underwent emergent tracheostomy by Dr.Bates in 03/2020 - was offered laryngectomy for a cT4N1M0 SCC - transglottic laryngeal cancer - he opted for chemoXRT COT on 05/2020 - now s/p TL, Left ND, total thyroidectomy, skin resection for peristomal persistent disease on 10/21/20 Final path: Negative margins, focal moderate dysplasia at tracheal margin ( see specimen Q and R) +PNI 0/6 nodes - No further adjuvant txt indicated.  - close surveillance recommended, especially at the stoma given focal moderate dysplasia on final path F/up in 2 months with PET and CT neck (scheduled for scan and F/U 01/20/2021) He will see Dr.Slijepcevic in the meantime: 1 month 01/14/2021 -Continue wet-to-dry dressings -Follow-up 1 month  2. Nutrition - continue PO and supplemental G tube feeds  3. 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 Jaclyn Shaggy this morning. Denies any ear or jaw pain, but does report tightness to the right side of his neck/jaw. Denies lingering fatigue or trouble sleeping.

## 2021-01-14 ENCOUNTER — Encounter: Payer: Self-pay | Admitting: Radiation Oncology

## 2021-01-14 ENCOUNTER — Ambulatory Visit
Admission: RE | Admit: 2021-01-14 | Discharge: 2021-01-14 | Disposition: A | Discharge status: Home / Self Care | Payer: 59 | Source: Ambulatory Visit | Attending: Radiation Oncology | Admitting: Radiation Oncology

## 2021-01-14 ENCOUNTER — Other Ambulatory Visit: Payer: Self-pay

## 2021-01-14 VITALS — BP 119/71 | HR 86 | Temp 96.9°F | Resp 18 | Ht 69.0 in | Wt 122.4 lb

## 2021-01-14 DIAGNOSIS — Z923 Personal history of irradiation: Secondary | ICD-10-CM | POA: Insufficient documentation

## 2021-01-14 DIAGNOSIS — C329 Malignant neoplasm of larynx, unspecified: Secondary | ICD-10-CM

## 2021-01-14 DIAGNOSIS — Z79899 Other long term (current) drug therapy: Secondary | ICD-10-CM | POA: Diagnosis not present

## 2021-01-14 DIAGNOSIS — C321 Malignant neoplasm of supraglottis: Secondary | ICD-10-CM | POA: Insufficient documentation

## 2021-01-14 DIAGNOSIS — C760 Malignant neoplasm of head, face and neck: Secondary | ICD-10-CM

## 2021-01-14 NOTE — Progress Notes (Signed)
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   Cancer Staging  Laryngeal cancer [facility]) Staging form: Larynx - Supraglottis, AJCC 8th Edition - Clinical stage from 03/23/2020: Stage IVA (cT4a, cN1, cM0) - Signed by Eppie Gibson, MD on 04/09/2020 Stage prefix: Initial diagnosis   CHIEF COMPLAINT:  Here for follow-up and surveillance of throat cancer  Narrative:    Erik [name] presents today for follow-up after completing radiation to his supraglottis on 06/01/2020  Pain issues, if any: Patient denies Using a feeding tube?: Currently just flushing with water daily (reports he's not instilling Osmolite) Weight changes, if any:  Wt Readings from Last 3 Encounters:  01/14/21 122 lb 6 oz (55.5 kg)  10/12/20 126 lb 9.6 oz (57.4 kg)  09/22/20 119 lb 9.6 oz (54.3 kg)   Swallowing issues, if any: Patient denies--feels he's able to eat/drink without all nutrition orally without difficulty Smoking or chewing tobacco? None  Using fluoride trays daily? N/A Last ENT visit was on: 11/25/2020 Saw Dr. Harriett Rush [place]:  1. [phone] SCC of the larynx  - patient was treated for a cT4N1M0 SCC of the larynx with chemoXRT COT on 05/2020 - he underwent emergent tracheostomy by Dr.Bates in 03/2020 - was offered laryngectomy for a cT4N1M0 SCC - transglottic laryngeal cancer - he opted for chemoXRT COT on 05/2020 - now s/p TL, Left ND, total thyroidectomy, skin resection for peristomal persistent disease on 10/21/20 Final path: Negative margins, focal moderate dysplasia at tracheal margin ( see specimen Q and R) +PNI 0/6 nodes - No further adjuvant txt indicated.  - close surveillance recommended, especially at the stoma given focal moderate dysplasia on final path F/up in 2  months with PET and CT neck (scheduled for scan and F/U 01/20/2021) He will see Dr.Slijepcevic in the meantime: 1 month 01/14/2021 -Continue wet-to-dry dressings -Follow-up 1 month  2. Nutrition - continue PO and supplemental G tube feeds  3. 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 Jaclyn Shaggy this morning. Denies any ear or jaw pain, but does report tightness to the right side of his neck/jaw. Denies lingering fatigue or trouble sleeping.  Wants to get PEG removed, has not used for months. Sees med onc in March.   Has not seen dental recently.   ALLERGIES:  has No Known Allergies.  Meds: Current Outpatient Medications  Medication Sig Dispense Refill   calcitRIOL (ROCALTROL) 0.5 MCG capsule Take 0.5 mcg by mouth 2 (two) times daily.     levothyroxine (SYNTHROID) 112 MCG tablet Take 1 tablet by mouth daily.     traMADol (ULTRAM) 50 MG tablet Take 1 tablet by mouth daily as needed.     Nutritional Supplements (FEEDING SUPPLEMENT, OSMOLITE 1.5 CAL,) LIQD Give 7 cartons of Osmolite 1.5 daily via PEG split into 4 feedings.  Give 60 mL free water flushes before and after bolus feedings with an additional 16 ounces of water via PEG or mouth as tolerated.  This provides 2485 cal and 104.3 g of protein meeting 100% calorie and protein needs. (Patient not taking: Reported on 01/14/2021) 1659 mL 12   No current facility-administered medications for  this encounter.    Physical Findings: The patient is in no acute distress. Patient is alert and oriented. Wt Readings from Last 3 Encounters:  01/14/21 122 lb 6 oz (55.5 kg)  10/12/20 126 lb 9.6 oz (57.4 kg)  09/22/20 119 lb 9.6 oz (54.3 kg)    height is 5\' 9"  (1.753 m) and weight is 122 lb 6 oz (55.5 kg). His temporal temperature is 96.9 F (36.1 C) (abnormal). His blood pressure is 119/71 and his pulse is 86.  His respiration is 18 and oxygen saturation is 98%. .  General: Alert and oriented, in no acute distress HEENT: Head is normocephalic. Extraocular movements are intact. Oropharynx/oral cavity w/out active mucositis or thrush visible on physical exam.  Neck: no adenopathy; trach tube in place, no evidence of stoma recurrence; post-op tissues satisfactorily healing. Skin: no local skin recurrence at neck. Subcutaneous rounded mass (posterior neck) correlates with past PET imaging, not hypermetabolic nor a clinical concern HEART RRR CHEST CTAB Psychiatric: Judgment and insight are intact. Affect is appropriate.   Lab Findings: Lab Results  Component Value Date   WBC 12.9 (H) 08/21/2020   HGB 8.5 (L) 08/21/2020   HCT 27.7 (L) 08/21/2020   MCV 96.5 08/21/2020   PLT 569 (H) 08/21/2020     Radiographic Findings: No results found.  Impression/Plan:    1) Head and Neck Cancer Status: healing from salvage surgery, NED, PET next week.  2) Nutritional Status: not using PEG, adamant for removal - will order   Wt Readings from Last 3 Encounters:  01/14/21 122 lb 6 oz (55.5 kg)  10/12/20 126 lb 9.6 oz (57.4 kg)  09/22/20 119 lb 9.6 oz (54.3 kg)   3) Risk Factors: The patient has been educated about risk factors including alcohol and tobacco abuse; they understand that avoidance of alcohol and tobacco is important to prevent recurrences as well as other cancers, he states he is abstaining from tobacco and marijuana.  4) Swallowing: function good per patient  5) Dental: Encouraged to continue regular followup with dentistry, and dental hygiene including fluoride rinses.  Today I  asked Anderson Malta to ensure he is scheduled w/ Dr Benson Norway for post tx exam - patient has been lost to f/u repeatedly.  6) Thyroid function: PTH/Thyroid following w/ endocrinology  7) Other: Follow-up in 60mo, sooner if needed. Med onc in 19mo, ENT next week w/ PET  On date of service, in total, I spent 30 minutes on this  encounter. Patient was seen in person. _____________________________________   Eppie Gibson, MD

## 2021-01-26 ENCOUNTER — Other Ambulatory Visit: Payer: Self-pay

## 2021-01-26 ENCOUNTER — Ambulatory Visit (HOSPITAL_COMMUNITY)
Admission: RE | Admit: 2021-01-26 | Discharge: 2021-01-26 | Disposition: A | Discharge status: Home / Self Care | Payer: 59 | Source: Ambulatory Visit | Attending: Radiation Oncology | Admitting: Radiation Oncology

## 2021-01-26 DIAGNOSIS — C329 Malignant neoplasm of larynx, unspecified: Secondary | ICD-10-CM | POA: Insufficient documentation

## 2021-01-26 DIAGNOSIS — Z431 Encounter for attention to gastrostomy: Secondary | ICD-10-CM | POA: Diagnosis present

## 2021-01-26 HISTORY — PX: IR GASTROSTOMY TUBE REMOVAL: IMG5492

## 2021-01-26 MED ORDER — LIDOCAINE VISCOUS HCL 2 % MT SOLN
OROMUCOSAL | Status: AC
  Filled 2021-01-26: qty 15

## 2021-02-04 ENCOUNTER — Other Ambulatory Visit: Payer: Self-pay

## 2021-02-04 DIAGNOSIS — C329 Malignant neoplasm of larynx, unspecified: Secondary | ICD-10-CM

## 2021-02-04 NOTE — Progress Notes (Signed)
I

## 2021-04-06 ENCOUNTER — Other Ambulatory Visit: Payer: 59

## 2021-04-06 ENCOUNTER — Ambulatory Visit: Payer: 59 | Admitting: Hematology and Oncology

## 2021-04-12 ENCOUNTER — Encounter: Payer: Self-pay | Admitting: Hematology and Oncology

## 2021-04-12 ENCOUNTER — Inpatient Hospital Stay (HOSPITAL_BASED_OUTPATIENT_CLINIC_OR_DEPARTMENT_OTHER): Payer: 59 | Admitting: Hematology and Oncology

## 2021-04-12 ENCOUNTER — Inpatient Hospital Stay: Payer: 59 | Attending: Hematology and Oncology

## 2021-04-12 ENCOUNTER — Other Ambulatory Visit: Payer: Self-pay

## 2021-04-12 VITALS — BP 103/66 | HR 80 | Temp 98.1°F | Resp 16 | Ht 69.0 in | Wt 131.4 lb

## 2021-04-12 DIAGNOSIS — Z87891 Personal history of nicotine dependence: Secondary | ICD-10-CM | POA: Insufficient documentation

## 2021-04-12 DIAGNOSIS — Z923 Personal history of irradiation: Secondary | ICD-10-CM | POA: Insufficient documentation

## 2021-04-12 DIAGNOSIS — C321 Malignant neoplasm of supraglottis: Secondary | ICD-10-CM

## 2021-04-12 DIAGNOSIS — C329 Malignant neoplasm of larynx, unspecified: Secondary | ICD-10-CM | POA: Diagnosis not present

## 2021-04-12 DIAGNOSIS — Z8521 Personal history of malignant neoplasm of larynx: Secondary | ICD-10-CM | POA: Insufficient documentation

## 2021-04-12 LAB — CBC WITH DIFFERENTIAL/PLATELET
Abs Immature Granulocytes: 0.01 10*3/uL (ref 0.00–0.07)
Basophils Absolute: 0 10*3/uL (ref 0.0–0.1)
Basophils Relative: 1 %
Eosinophils Absolute: 0.3 10*3/uL (ref 0.0–0.5)
Eosinophils Relative: 5 %
HCT: 36 % — ABNORMAL LOW (ref 39.0–52.0)
Hemoglobin: 11.8 g/dL — ABNORMAL LOW (ref 13.0–17.0)
Immature Granulocytes: 0 %
Lymphocytes Relative: 9 %
Lymphs Abs: 0.5 10*3/uL — ABNORMAL LOW (ref 0.7–4.0)
MCH: 29.3 pg (ref 26.0–34.0)
MCHC: 32.8 g/dL (ref 30.0–36.0)
MCV: 89.3 fL (ref 80.0–100.0)
Monocytes Absolute: 0.5 10*3/uL (ref 0.1–1.0)
Monocytes Relative: 9 %
Neutro Abs: 4.3 10*3/uL (ref 1.7–7.7)
Neutrophils Relative %: 76 %
Platelets: 325 10*3/uL (ref 150–400)
RBC: 4.03 MIL/uL — ABNORMAL LOW (ref 4.22–5.81)
RDW: 15.5 % (ref 11.5–15.5)
WBC: 5.8 10*3/uL (ref 4.0–10.5)
nRBC: 0 % (ref 0.0–0.2)

## 2021-04-12 LAB — CMP (CANCER CENTER ONLY)
ALT: 12 U/L (ref 0–44)
AST: 16 U/L (ref 15–41)
Albumin: 3.8 g/dL (ref 3.5–5.0)
Alkaline Phosphatase: 65 U/L (ref 38–126)
Anion gap: 8 (ref 5–15)
BUN: 18 mg/dL (ref 6–20)
CO2: 29 mmol/L (ref 22–32)
Calcium: 7.6 mg/dL — ABNORMAL LOW (ref 8.9–10.3)
Chloride: 104 mmol/L (ref 98–111)
Creatinine: 0.88 mg/dL (ref 0.61–1.24)
GFR, Estimated: >60 mL/min (ref >60)
Glucose, Bld: 84 mg/dL (ref 70–99)
Potassium: 4.3 mmol/L (ref 3.5–5.1)
Sodium: 141 mmol/L (ref 135–145)
Total Bilirubin: 0.4 mg/dL (ref 0.3–1.2)
Total Protein: 6.8 g/dL (ref 6.5–8.1)

## 2021-04-12 LAB — TSH: TSH: 1.04 u[IU]/mL (ref 0.320–4.118)

## 2021-04-12 LAB — MAGNESIUM: Magnesium: 1.7 mg/dL (ref 1.7–2.4)

## 2021-04-12 NOTE — Progress Notes (Signed)
Erik [name] ? ?Patient Care Team: ?Erik Herter, NP as PCP - General (Nurse Practitioner) ?Erik Quitter, MD as Consulting Physician (Otolaryngology) ?Erik Pike, MD as Consulting Physician (Hematology and Oncology) ?Erik Gibson, MD as Consulting Physician (Radiation Oncology) ?Malmfelt, Stephani Police, RN as Oncology Nurse Navigator ? ?CHIEF COMPLAINTS/PURPOSE OF CONSULTATION:  ?Follow up after completion of chemotherapy ? ?ASSESSMENT & PLAN:  ?Laryngeal cancer (Erik [name]) ?This is a very pleasant 56 year old male patient with past medical history significant for laryngeal squamous cell carcinoma status postchemotherapy and radiation with weekly cisplatin completed in May 2022 who is here for follow-up.  Patient continues to improve since completion of chemotherapy and radiation and has no clinical evidence of recurrence.  He has ENT follow-up in April.  Last imaging in December 2022 without any evidence of residual disease. ?At this time there appears to be no concern for active disease.  He should continue surveillance with ENT.  Labs from today without any concerns. ?He continues to gain weight and has been eating as tolerated. ?He will return to clinic in 6 months with repeat labs. ? ? ?Orders Placed This Encounter  ?Procedures  ? IR REMOVAL TUN ACCESS W/ PORT W/O FL MOD SED  ?  Standing Status:   Future  ?  Standing Expiration Date:   04/13/2022  ?  Order Specific Question:   Reason for exam:  ?  Answer:   completed chemo  ?  Order Specific Question:   Preferred Imaging Location?  ?  Answer:   [facility]  ? ? ? ? ?HISTORY OF PRESENTING ILLNESS:  ?Erik [name] 56 y.o. male is here because of supraglottic cancer s.p chemoradiation ? ?Oncology History Overview [name]  ?Chronology ? ?Pt had noticed ongoing hoarseness for almost 6 months, and was seen by a doctor who suggested that it may be related to GERD ?He used medication for GERD but his hoarseness continued to worsen. He  then started getting SOB and went to the ER. ?He was found to have airway obstruction and underwent elective tracheostomy and direct laryngoscopy with biopsy. ? ?Pertinent imaging and pathology results so far. ? ?03/04/2020 ? ?Mucosal irregularity and mucosal/submucosal edema of the ?supraglottic and glottic larynx. Primary differential considerations ?include infectious/inflammatory laryngeal edema versus laryngeal ?malignancy. ENT consultation and direct visualization recommended. ?Associated mild-to-moderate narrowing, and mild rightward deviation, ?of the airway at this level. ?  ?Additional edema within the cutaneous/subcutaneous ventral neck. ?  ?Mildly enlarged left level 2/3 lymph node which may be reactive or ?could reflect metastatic nodal disease. ?  ?Nonspecific 1.4 cm part cystic/part solid cutaneous/subcutaneous ?lesion within the midline upper neck. Direct visualization ?recommended. ?  ?03/05/2020 ? ?FINAL MICROSCOPIC DIAGNOSIS:  ? ?A. LEFT VOCAL CORD, EXCISION:  ?- Invasive moderately differentiated squamous cell carcinoma, see  ?comment  ? ?B. RIGHT VOCAL CORD, EXCISION:  ?- Invasive moderately differentiated squamous cell carcinoma, see  ?comment  ? ?C. LEFT FALSE VOCAL CORD, EXCISION:  ?- Invasive moderately differentiated squamous cell carcinoma, see  ?comment  ? ?P 16 positive ?He is now on weekly cisplatin. ?He completed 6 weekly cycles of cisplatin ?  ?Laryngeal cancer ([name])  ?03/04/2020 Initial Diagnosis  ? Malignant neoplasm of supraglottis [facility]) ?  ?03/23/2020 Cancer Staging  ? Staging form: Larynx - Supraglottis, AJCC 8th Edition ?- Clinical stage from 03/23/2020: Stage IVA (cT4a, cN1, cM0) - Signed by Erik Gibson, MD on 04/09/2020 ?Stage prefix: Initial diagnosis ? ?  ?04/15/2020 -  Chemotherapy  ?  Patient is on Treatment Plan: HEAD/NECK CISPLATIN Q7D ? ?  ? ?  ? ? ?INTERIM HISTORY ? ?Erik [name] is here for follow up since he completed chemoradiation.  ?He continues to improve.  Since last visit  he has gained 5 to 6 pounds.  He had a follow-up with Erik [name] and he has another follow-up with Erik [name] in April.  He denies any new complaints.  He was hoping to get his port removed.  He still has some difficulty with swallowing but he is trying to eat his past.  He denies any changes in breathing.he still writes most of his conversation has difficulty speaking.  No change in bowel habits or urinary habits.  Rest of the pertinent 10 point ROS reviewed and neg. ? ?MEDICAL HISTORY:  ?Past Medical History:  ?Diagnosis Date  ? Coronary artery disease   ? Dyspnea   ? GERD (gastroesophageal reflux disease)   ? Phreesia 02/21/2020  ? Pneumonia   ? Ruptured lumbar intervertebral disc   ? ? ?SURGICAL HISTORY: ?Past Surgical History:  ?Procedure Laterality Date  ? CARDIAC CATHETERIZATION    ? with stent placement  ? IR GASTROSTOMY TUBE MOD SED  04/20/2020  ? IR GASTROSTOMY TUBE REMOVAL  01/26/2021  ? IR IMAGING GUIDED PORT INSERTION  04/20/2020  ? TONSILLECTOMY    ? TRACHEOSTOMY TUBE PLACEMENT N/A 03/05/2020  ? Procedure: AWAKE TRACHEOSTOMY, DIRECT LARYNGOSCOPY WITH BIOPSY;  Surgeon: Erik Quitter, MD;  Location: [facility];  Service: ENT;  Laterality: N/A;  ? ? ?SOCIAL HISTORY: ?Social History  ? ?Socioeconomic History  ? Marital status: Married  ?  Spouse name: Not on file  ? Number of children: Not on file  ? Years of education: Not on file  ? Highest education level: Not on file  ?Occupational History  ? Not on file  ?Tobacco Use  ? Smoking status: Former  ?  Types: Cigarettes  ?  Quit date: 03/30/2020  ?  Years since quitting: 1.0  ? Smokeless tobacco: Former  ?  Types: Chew  ?Vaping Use  ? Vaping Use: Former  ?Substance and Sexual Activity  ? Alcohol use: No  ? Drug use: Yes  ?  Types: Marijuana  ? Sexual activity: Yes  ?Other Topics Concern  ? Not on file  ?Social History Narrative  ? Not on file  ? ?Social Determinants of Health  ? ?Financial Resource Strain: High Risk  ? Difficulty of Paying Living Expenses: Hard  ?Food  Insecurity: Food Insecurity Present  ? Worried About Charity fundraiser in the Last Year: Sometimes true  ? Ran Out of Food in the Last Year: Sometimes true  ?Transportation Needs: Not on file  ?Physical Activity: Not on file  ?Stress: Not on file  ?Social Connections: Not on file  ?Intimate Partner Violence: Not on file  ? ? ?FAMILY HISTORY: ?No family history on file. ? ?ALLERGIES:  has No Known Allergies. ? ?MEDICATIONS:  ?Current Outpatient Medications  ?Medication Sig Dispense Refill  ? calcitRIOL (ROCALTROL) 0.5 MCG capsule Take 0.5 mcg by mouth 2 (two) times daily.    ? levothyroxine (SYNTHROID) 112 MCG tablet Take 1 tablet by mouth daily.    ? Nutritional Supplements (FEEDING SUPPLEMENT, OSMOLITE 1.5 CAL,) LIQD Give 7 cartons of Osmolite 1.5 daily via PEG split into 4 feedings.  Give 60 mL free water flushes before and after bolus feedings with an additional 16 ounces of water via PEG or mouth as tolerated.  This provides 2485 cal  and 104.3 g of protein meeting 100% calorie and protein needs. (Patient not taking: Reported on 01/14/2021) 1659 mL 12  ? traMADol (ULTRAM) 50 MG tablet Take 1 tablet by mouth daily as needed.    ? ?No current facility-administered medications for this visit.  ? ?PHYSICAL EXAMINATION: ?ECOG PERFORMANCE STATUS: 1 - Symptomatic but completely ambulatory ? ?Vitals:  ? 04/12/21 1321  ?BP: 103/66  ?Pulse: 80  ?Resp: 16  ?Temp: 98.1 ?F (36.7 ?C)  ?SpO2: 97%  ? ? ?Filed Weights  ? 04/12/21 1321  ?Weight: 131 lb 6.4 oz (59.6 kg)  ? ? ?GENERAL:alert, no distress and comfortable ?SKIN: skin color, texture, turgor are normal, no rashes or significant lesions ?EYES: normal, conjunctiva are pink and non-injected, sclera clear ?OROPHARYNX:no exudate, no erythema and lips, buccal mucosa, and tongue normal  ?NECK: Graft noted around the tracheostomy site.   ?LYMPH:  no palpable lymphadenopathy in the cervical, axillary or inguinal ?LUNGS: clear to auscultation and percussion with normal breathing  effort ?HEART: regular rate & rhythm and no murmurs and no lower extremity edema ?ABDOMEN:abdomen soft, non-tender and normal bowel sounds ?Musculoskeletal:no cyanosis of digits and no clubbing  ?PSY

## 2021-04-12 NOTE — Assessment & Plan Note (Signed)
This is a very pleasant 56 year old male patient with past medical history significant for laryngeal squamous cell carcinoma status postchemotherapy and radiation with weekly cisplatin completed in May 2022 who is here for follow-up.  Patient continues to improve since completion of chemotherapy and radiation and has no clinical evidence of recurrence.  He has ENT follow-up in April.  Last imaging in December 2022 without any evidence of residual disease. ?At this time there appears to be no concern for active disease.  He should continue surveillance with ENT.  Labs from today without any concerns. ?He continues to gain weight and has been eating as tolerated. ?He will return to clinic in 6 months with repeat labs. ?

## 2021-04-21 ENCOUNTER — Other Ambulatory Visit: Payer: Self-pay

## 2021-04-21 ENCOUNTER — Ambulatory Visit (HOSPITAL_COMMUNITY)
Admission: RE | Admit: 2021-04-21 | Discharge: 2021-04-21 | Disposition: A | Discharge status: Home / Self Care | Payer: 59 | Source: Ambulatory Visit | Attending: Hematology and Oncology | Admitting: Hematology and Oncology

## 2021-04-21 DIAGNOSIS — C329 Malignant neoplasm of larynx, unspecified: Secondary | ICD-10-CM

## 2021-04-21 DIAGNOSIS — Z8521 Personal history of malignant neoplasm of larynx: Secondary | ICD-10-CM | POA: Diagnosis not present

## 2021-04-21 DIAGNOSIS — Z452 Encounter for adjustment and management of vascular access device: Secondary | ICD-10-CM | POA: Diagnosis present

## 2021-04-21 HISTORY — PX: IR REMOVAL TUN ACCESS W/ PORT W/O FL MOD SED: IMG2290

## 2021-04-21 MED ORDER — LIDOCAINE-EPINEPHRINE 1 %-1:100000 IJ SOLN
INTRAMUSCULAR | Status: AC
  Filled 2021-04-21: qty 1

## 2021-04-21 MED ORDER — LIDOCAINE-EPINEPHRINE (PF) 1.5 %-1:200000 IJ SOLN
INTRAMUSCULAR | Status: DC | PRN
  Administered 2021-04-21: 10 mL

## 2021-04-21 NOTE — Procedures (Signed)
Interventional Radiology Procedure: ? ? ?Indications: History of laryngeal cancer.  Port is no longer needed. ? ?Procedure: Port removal ? ?Findings: Complete removal of right chest port ? ?Complications: No immediate complications noted. ?    ?EBL: Minimal ? ?Plan: Keep incision dry for 24 hours.  ? ? ?Akshith Moncus R. Anselm Pancoast, MD  ?Pager: [phone] ? ? ? ?  ?

## 2021-07-19 ENCOUNTER — Encounter: Payer: Self-pay | Admitting: Radiation Oncology

## 2021-07-19 ENCOUNTER — Other Ambulatory Visit: Payer: Self-pay

## 2021-07-19 ENCOUNTER — Ambulatory Visit
Admission: RE | Admit: 2021-07-19 | Discharge: 2021-07-19 | Disposition: A | Discharge status: Home / Self Care | Payer: 59 | Source: Ambulatory Visit | Attending: Radiation Oncology | Admitting: Radiation Oncology

## 2021-07-19 VITALS — BP 107/77 | HR 93 | Temp 97.0°F | Resp 18 | Ht 69.0 in | Wt 128.1 lb

## 2021-07-19 DIAGNOSIS — Z79890 Hormone replacement therapy (postmenopausal): Secondary | ICD-10-CM | POA: Insufficient documentation

## 2021-07-19 DIAGNOSIS — Z8521 Personal history of malignant neoplasm of larynx: Secondary | ICD-10-CM | POA: Insufficient documentation

## 2021-07-19 DIAGNOSIS — Z923 Personal history of irradiation: Secondary | ICD-10-CM | POA: Insufficient documentation

## 2021-07-19 DIAGNOSIS — C329 Malignant neoplasm of larynx, unspecified: Secondary | ICD-10-CM

## 2021-07-19 NOTE — Progress Notes (Addendum)
Mr. Erik [name] presents today for follow-up after completing radiation to his supraglottis on 06/01/2020  Pain issues, if any: None Using a feeding tube?: N/A Weight changes, if any: None  Swallowing issues, if any: No-Had SLP evaluation at [facility] on 07/12/2021: "Swallowing - Esophagram completed 06/21/21 with findings as noted above. Patient reports foods such as pizza or bread will occasionally stick/hang in his throat. Plan for upcoming esophageal dilation with Dr. Rowe Clack, scheduled today for 08/24/21." Smoking or chewing tobacco? No Using fluoride trays daily? No Last ENT visit was on:  07/14/2021 Saw Dr. Harriett Rush [place]:  "- No further adjuvant txt indicated.  - close surveillance recommended, especially at the stoma given focal moderate dysplasia on final path - Follow up with me in 3 months ( we will plan on Scans in 3 months given high risk of recurrence)"  Other notable issues, if any: None-Had CT neck w/ contrast and CT chest w/o contrast on 07/05/2021 at [facility].

## 2021-07-19 NOTE — Progress Notes (Signed)
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   Cancer Staging  Laryngeal cancer [facility]) Staging form: Larynx - Supraglottis, AJCC 8th Edition - Clinical stage from 03/23/2020: Stage IVA (cT4a, cN1, cM0) - Signed by Eppie Gibson, MD on 04/09/2020 Stage prefix: Initial diagnosis    CHIEF COMPLAINT:  Here for follow-up and surveillance of throat cancer  Narrative:     Erik [name] presents today for follow-up after completing radiation to his supraglottis on 06/01/2020  Pain issues, if any: None Using a feeding tube?: N/A Weight changes, if any: None  Swallowing issues, if any: No-Had SLP evaluation at [facility] on 07/12/2021: "Swallowing - Esophagram completed 06/21/21 with findings as noted above. Patient reports foods such as pizza or bread will occasionally stick/hang in his throat. Plan for upcoming esophageal dilation with Erik [name], scheduled today for 08/24/21." Smoking or chewing tobacco? No Using fluoride trays daily? No Last ENT visit was on:  07/14/2021 Saw Erik [name]:  "- No further adjuvant txt indicated.  - close surveillance recommended, especially at the stoma given focal moderate dysplasia on final path - Follow up with me in 3 months ( we will plan on Scans in 3 months given high risk of recurrence)"  Other notable issues, if any: None-Had CT neck w/ contrast and CT chest w/o contrast on 07/05/2021 at [facility]  He remains in remission  Overall he states he is very pleased with how he is doing.  He is speaking well through his electrolarynx.  I personally spoke with Erik [name] today who is also pleased with his progress.     ALLERGIES:  has No Known Allergies.  Meds: Current Outpatient Medications  Medication Sig Dispense  Refill   calcitRIOL (ROCALTROL) 0.5 MCG capsule Take 0.5 mcg by mouth 2 (two) times daily.     levothyroxine (SYNTHROID) 112 MCG tablet Take 1 tablet by mouth daily.     Nutritional Supplements (FEEDING SUPPLEMENT, OSMOLITE 1.5 CAL,) LIQD Give 7 cartons of Osmolite 1.5 daily via PEG split into 4 feedings.  Give 60 mL free water flushes before and after bolus feedings with an additional 16 ounces of water via PEG or mouth as tolerated.  This provides 2485 cal and 104.3 g of protein meeting 100% calorie and protein needs. (Patient not taking: Reported on 01/14/2021) 1659 mL 12   traMADol (ULTRAM) 50 MG tablet Take 1 tablet by mouth daily as needed.     No current facility-administered medications for this encounter.    Physical Findings: The patient is in no acute distress. Patient is alert and oriented. Wt Readings from Last 3 Encounters:  07/19/21 128 lb 2 oz (58.1 kg)  04/12/21 131 lb 6.4 oz (59.6 kg)  01/14/21 122 lb 6 oz (55.5 kg)    height is '5\' 9"'$  (1.753 m) and weight is 128 lb 2 oz (58.1 kg). His temporal temperature is 97 F (36.1 C) (abnormal). His blood pressure is 107/77 and his pulse is 93. His respiration is 18 and oxygen saturation is 98%. .  General: Alert and oriented, in no acute distress HEENT: Head is normocephalic. Extraocular movements are intact. Oropharynx/oral cavity w/out tumor or thrush visible on physical exam.  Neck: no adenopathy; stoma appears healthy - no evidence of stoma recurrence  Skin: no local skin recurrence at neck. Subcutaneous rounded mass (posterior neck) correlates with past PET imaging, not hypermetabolic nor a clinical concern HEART RRR CHEST CTAB Abdomen soft and nontender Psychiatric: Judgment and insight are intact. Affect is appropriate.   Lab Findings: Lab Results  Component Value Date   WBC 5.8 04/12/2021   HGB 11.8 (L) 04/12/2021   HCT 36.0 (L) 04/12/2021   MCV 89.3 04/12/2021   PLT 325 04/12/2021     Radiographic Findings: No  results found.  Impression/Plan:    1) Head and Neck Cancer Status: NED - he will continue to follow closely with ENT and get his imaging at [facility]  2) Nutritional Status: Managing well nutritionally without feeding tube  Wt Readings from Last 3 Encounters:  07/19/21 128 lb 2 oz (58.1 kg)  04/12/21 131 lb 6.4 oz (59.6 kg)  01/14/21 122 lb 6 oz (55.5 kg)   3) Risk Factors: The patient has been educated about risk factors including alcohol and tobacco abuse; they understand that avoidance of alcohol and tobacco is important to prevent recurrences as well as other cancers, he states he is abstaining from tobacco  4) Swallowing: function good per patient  5)  Thyroid function: PTH/Thyroid following w/ endocrinology  6 Other: Follow-up in 64mo sooner if needed. Med onc in 353moENT as scheduled in 2-3 mo as well  On date of service, in total, I spent 20 minutes on this encounter. Patient was seen in person. _____________________________________   SaEppie GibsonMD

## 2021-08-22 ENCOUNTER — Other Ambulatory Visit: Payer: Self-pay

## 2021-10-07 ENCOUNTER — Telehealth: Payer: Self-pay | Admitting: Hematology and Oncology

## 2021-10-07 NOTE — Telephone Encounter (Signed)
R/s per 9/8 in basket, message has been left

## 2021-10-13 ENCOUNTER — Ambulatory Visit: Payer: 59 | Admitting: Hematology and Oncology

## 2021-10-13 ENCOUNTER — Other Ambulatory Visit: Payer: 59

## 2021-10-25 ENCOUNTER — Other Ambulatory Visit: Payer: Self-pay | Admitting: *Deleted

## 2021-10-26 ENCOUNTER — Inpatient Hospital Stay (HOSPITAL_BASED_OUTPATIENT_CLINIC_OR_DEPARTMENT_OTHER): Payer: 59 | Admitting: Hematology and Oncology

## 2021-10-26 ENCOUNTER — Other Ambulatory Visit: Payer: Self-pay

## 2021-10-26 ENCOUNTER — Other Ambulatory Visit: Payer: Self-pay | Admitting: *Deleted

## 2021-10-26 ENCOUNTER — Inpatient Hospital Stay: Payer: 59 | Attending: Hematology and Oncology

## 2021-10-26 DIAGNOSIS — Z87891 Personal history of nicotine dependence: Secondary | ICD-10-CM | POA: Diagnosis not present

## 2021-10-26 DIAGNOSIS — C329 Malignant neoplasm of larynx, unspecified: Secondary | ICD-10-CM

## 2021-10-26 DIAGNOSIS — C760 Malignant neoplasm of head, face and neck: Secondary | ICD-10-CM

## 2021-10-26 DIAGNOSIS — C321 Malignant neoplasm of supraglottis: Secondary | ICD-10-CM | POA: Diagnosis present

## 2021-10-26 LAB — CMP (CANCER CENTER ONLY)
ALT: 13 U/L (ref 0–44)
AST: 19 U/L (ref 15–41)
Albumin: 3.8 g/dL (ref 3.5–5.0)
Alkaline Phosphatase: 76 U/L (ref 38–126)
Anion gap: 5 (ref 5–15)
BUN: 20 mg/dL (ref 6–20)
CO2: 30 mmol/L (ref 22–32)
Calcium: 8.8 mg/dL — ABNORMAL LOW (ref 8.9–10.3)
Chloride: 103 mmol/L (ref 98–111)
Creatinine: 1.06 mg/dL (ref 0.61–1.24)
GFR, Estimated: >60 mL/min (ref >60)
Glucose, Bld: 86 mg/dL (ref 70–99)
Potassium: 4 mmol/L (ref 3.5–5.1)
Sodium: 138 mmol/L (ref 135–145)
Total Bilirubin: 0.4 mg/dL (ref 0.3–1.2)
Total Protein: 7.1 g/dL (ref 6.5–8.1)

## 2021-10-26 LAB — CBC WITH DIFFERENTIAL (CANCER CENTER ONLY)
Abs Immature Granulocytes: 0.02 10*3/uL (ref 0.00–0.07)
Basophils Absolute: 0.1 10*3/uL (ref 0.0–0.1)
Basophils Relative: 1 %
Eosinophils Absolute: 0.2 10*3/uL (ref 0.0–0.5)
Eosinophils Relative: 2 %
HCT: 36.8 % — ABNORMAL LOW (ref 39.0–52.0)
Hemoglobin: 12.6 g/dL — ABNORMAL LOW (ref 13.0–17.0)
Immature Granulocytes: 0 %
Lymphocytes Relative: 9 %
Lymphs Abs: 0.6 10*3/uL — ABNORMAL LOW (ref 0.7–4.0)
MCH: 30.1 pg (ref 26.0–34.0)
MCHC: 34.2 g/dL (ref 30.0–36.0)
MCV: 88 fL (ref 80.0–100.0)
Monocytes Absolute: 0.5 10*3/uL (ref 0.1–1.0)
Monocytes Relative: 7 %
Neutro Abs: 5.9 10*3/uL (ref 1.7–7.7)
Neutrophils Relative %: 81 %
Platelet Count: 347 10*3/uL (ref 150–400)
RBC: 4.18 MIL/uL — ABNORMAL LOW (ref 4.22–5.81)
RDW: 13.4 % (ref 11.5–15.5)
WBC Count: 7.2 10*3/uL (ref 4.0–10.5)
nRBC: 0 % (ref 0.0–0.2)

## 2021-10-26 NOTE — Progress Notes (Signed)
[place] FOLLOW UP NOTE  Patient Care Team: [name], NP as PCP - General (Nurse Practitioner) Melida Quitter, MD as Consulting Physician (Otolaryngology) Benay Pike, MD as Consulting Physician (Hematology and Oncology) Eppie Gibson, MD as Consulting Physician (Radiation Oncology) Mal[place]elt, Stephani Police, RN as Oncology Nurse Navigator  CHIEF COMPLAINTS/PURPOSE OF CONSULTATION:  Follow up after completion of chemotherapy  ASSESSMENT & PLAN:   Laryngeal cancer [facility]) This is a very pleasant 56 year old male patient with past medical history significant for 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  Final path showed negative margins, focal moderate dysplasia tracheal margin, 0 out of 6 nodes, positive for perineural invasion.   Since his last visit, he had some imaging which is concerning for recurrence.  He is yet to follow-up with ENT at [facility] for further recommendations.  He denies any clinical symptoms. Our nurse navigator will try to get hold of Dr. 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 He was clearly instructed to keep [name] posted if the plan changes.  If he is not a candidate for surgical resection or reirradiation, we can consider systemic therapy.  No orders of the defined types were placed in this encounter.   HISTORY OF PRESENTING ILLNESS:  [employer] 56 y.o. male is here because of supraglottic cancer s.p chemoradiation  Oncology History Overview Note  Chronology  Pt had noticed ongoing hoarseness for almost 6 months, and was seen by a doctor who suggested that it may be related to  GERD He used medication for GERD but his hoarseness continued to worsen. He then started getting SOB and went to the ER. He was found to have airway obstruction and underwent elective tracheostomy and direct laryngoscopy with biopsy.  Pertinent imaging and pathology results so far.  03/04/2020  Mucosal irregularity and mucosal/submucosal edema of the supraglottic and glottic larynx. Primary differential considerations include infectious/inflammatory laryngeal edema versus laryngeal malignancy. ENT consultation and direct visualization recommended. Associated mild-to-moderate narrowing, and mild rightward deviation, of the airway at this level.   Additional edema within the cutaneous/subcutaneous ventral neck.   Mildly enlarged left level 2/3 lymph node which may be reactive or could reflect metastatic nodal disease.   Nonspecific 1.4 cm part cystic/part solid cutaneous/subcutaneous lesion within the midline upper neck. Direct visualization recommended.   03/05/2020  FINAL MICROSCOPIC DIAGNOSIS:   A. LEFT VOCAL CORD, EXCISION:  - Invasive moderately differentiated squamous cell carcinoma, see  comment   B. RIGHT VOCAL CORD, EXCISION:  - Invasive moderately differentiated squamous cell carcinoma, see  comment   C. LEFT FALSE VOCAL CORD, EXCISION:  - Invasive moderately differentiated squamous cell carcinoma, see  comment   P 16 positive He is now on weekly cisplatin. He completed 6 weekly cycles of cisplatin   Laryngeal cancer ([place])  03/04/2020 Initial Diagnosis   Malignant neoplasm of supraglottis ([place])   03/23/2020 Cancer Staging   Staging form: Larynx - Supraglottis, AJCC 8th Edition - Clinical stage from 03/23/2020: Stage IVA (cT4a, cN1, cM0) - Signed by Eppie Gibson, MD on 04/09/2020 Stage prefix: Initial diagnosis   04/15/2020 - 05/19/2020 Chemotherapy   Patient is on Treatment Plan : HEAD/NECK Cisplatin q7d      Since his  chemoradiation, he underwent laryngectomy  for, left nodal dissection, total thyroidectomy, skin resection for peristomal persistent disease in September 2022.  Final path showed negative margins, focal moderate dysplasia tracheal margin, 0 out of 6 nodes, positive for perineural invasion.  Close surveillance recommended.  He then had some posttreatment scans which mostly showed no evidence of disease except for the last scan in June which showed some small lung nodules.  He most recently had CT neck and CT chest which showed progressive confluent adenopathy at the thoracic inlet in right paratracheal space.  CT neck also showed necrotic soft tissue in the right supraclavicular region extending into the upper mediastinum.  There is multifocal encasement of arterial and venous vasculature, subocclusive filling defect seen within the visualized right brachiocephalic vein extending inferiorly to the junction of SVC, no abnormal lymphadenopathy, high suspicion for recurrence.  INTERIM HISTORY  Patient is here for follow-up.  Since his last visit, he had some imaging which was concerning for recurrence. He however denies any complaints except for some soreness in the chest after some trauma.  No difficulty swallowing or breathing.  He overall feels well.  Rest of the pertinent 10 point ROS reviewed and negative  MEDICAL HISTORY:  Past Medical History:  Diagnosis Date   Coronary artery disease    Dyspnea    GERD (gastroesophageal reflux disease)    Phreesia 02/21/2020   Pneumonia    Ruptured lumbar intervertebral disc     SURGICAL HISTORY: Past Surgical History:  Procedure Laterality Date   CARDIAC CATHETERIZATION     with stent placement   IR GASTROSTOMY TUBE MOD SED  04/20/2020   IR GASTROSTOMY TUBE REMOVAL  01/26/2021   IR IMAGING GUIDED PORT INSERTION  04/20/2020   IR REMOVAL TUN ACCESS W/ PORT W/O FL MOD SED  04/21/2021   TONSILLECTOMY     TRACHEOSTOMY TUBE PLACEMENT N/A 03/05/2020   Procedure: AWAKE TRACHEOSTOMY, DIRECT LARYNGOSCOPY  WITH BIOPSY;  Surgeon: Melida Quitter, MD;  Location: [facility];  Service: ENT;  Laterality: N/A;    SOCIAL HISTORY: Social History   Socioeconomic History   Marital status: Married    Spouse name: Not on file   Number of children: Not on file   Years of education: Not on file   Highest education level: Not on file  Occupational History   Not on file  Tobacco Use   Smoking status: Former    Types: Cigarettes    Quit date: 03/30/2020    Years since quitting: 1.5   Smokeless tobacco: Former    Types: Nurse, children's Use: Former  Substance and Sexual Activity   Alcohol use: No   Drug use: Yes    Types: Marijuana   Sexual activity: Yes  Other Topics Concern   Not on file  Social History Narrative   Not on file   Social Determinants of Health   Financial Resource Strain: [place] (05/06/2020)   Overall Financial Resource Strain (CARDIA)    Difficulty of Paying Living Expenses: Hard  Food Insecurity: Food Insecurity Present (05/06/2020)   Hunger Vital Sign    Worried About [place] in the Last Year: Sometimes true    Ran Out of Food in the Last Year: Sometimes true  Transportation Needs: No Transportation Needs (03/17/2020)   PRAPARE - Hydrologist (Medical): No    Lack of Transportation (Non-Medical): No  Physical Activity: Not on file  Stress: Not on file  Social Connections: Not on file  Intimate Partner Violence: Not on file    FAMILY HISTORY: No family history on file.  ALLERGIES:  has No Known Allergies.  MEDICATIONS:  Current Outpatient Medications  Medication Sig Dispense Refill   calcitRIOL (ROCALTROL) 0.5 MCG capsule Take 0.5 mcg by mouth 2 (two) times daily.     levothyroxine (SYNTHROID) 112 MCG tablet Take 1 tablet by mouth daily.     Nutritional Supplements (FEEDING SUPPLEMENT, OSMOLITE 1.5 CAL,) LIQD Give 7 cartons of Osmolite 1.5 daily via PEG split into 4 feedings.  Give 60 mL free water flushes before and  after bolus feedings with an additional 16 ounces of water via PEG or mouth as tolerated.  This provides 2485 cal and 104.3 g of protein meeting 100% calorie and protein needs. (Patient not taking: Reported on 01/14/2021) 1659 mL 12   traMADol (ULTRAM) 50 MG tablet Take 1 tablet by mouth daily as needed.     No current facility-administered medications for this visit.   PHYSICAL EXAMINATION: ECOG PERFORMANCE STATUS: 1 - Symptomatic but completely ambulatory  Vitals:   10/26/21 1325  BP: (!) 142/86  Pulse: 69  Resp: 16  Temp: 97.9 F (36.6 C)  SpO2: 100%    Filed Weights   10/26/21 1325  Weight: 123 lb 1.6 oz (55.8 kg)    Physical Exam Constitutional:      Appearance: Normal appearance.  Neck:     Comments: Tracheostomy in place.  Musculoskeletal:        General: No swelling. Normal range of motion.  Neurological:     Mental Status: He is alert.    LABORATORY DATA:  I have reviewed the data as listed Lab Results  Component Value Date   WBC 7.2 10/26/2021   HGB 12.6 (L) 10/26/2021   HCT 36.8 (L) 10/26/2021   MCV 88.0 10/26/2021   PLT 347 10/26/2021     Chemistry      Component Value Date/Time   NA 138 10/26/2021 1312   K 4.0 10/26/2021 1312   CL 103 10/26/2021 1312   CO2 30 10/26/2021 1312   BUN 20 10/26/2021 1312   CREATININE 1.06 10/26/2021 1312      Component Value Date/Time   CALCIUM 8.8 (L) 10/26/2021 1312   ALKPHOS 76 10/26/2021 1312   AST 19 10/26/2021 1312   ALT 13 10/26/2021 1312   BILITOT 0.4 10/26/2021 1312     CBC from today showed white blood cell count of 5.8, hemoglobin 11.8, platelet count of 325,000 CMP with normal creatinine, no evidence of transaminitis Magnesium 1.5 TSH normal  Reviewed pertinent records  RADIOGRAPHIC STUDIES: I have personally reviewed the radiological images as listed and agreed with the findings in the report. No results found.  All questions were answered. The patient knows to call the clinic with any  problems, questions or concerns. I spent 40 minutes in the care of this patient including history, review of records, counseling and coordination of care  Benay Pike, MD 10/26/2021 2:43 PM  Addendum  I discussed the case with Dr. Hendricks Limes, it appears that patient is having progression and he is not going to be a candidate for surgical excision.  They have discussed him in the tumor board at [facility] and the recommendation was to consider quad shock followed by immunotherapy.  Dr. Hendricks Limes is yet to discuss with patient and he will keep [name] posted if he has to receive treatment locally.  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

## 2021-10-26 NOTE — Assessment & Plan Note (Signed)
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  Final path showed negative margins, focal moderate dysplasia tracheal margin, 0 out of 6 nodes, positive for perineural invasion.   Since his last visit, he had some imaging which is concerning for recurrence.  He is yet to follow-up with ENT at [facility] for further recommendations.  He denies any clinical symptoms. Our nurse navigator will try to get hold of Dr. 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 He was clearly instructed to keep [name] posted if the plan changes.  If he is not a candidate for surgical resection or reirradiation, we can consider systemic therapy.

## 2021-10-28 ENCOUNTER — Encounter: Payer: Self-pay | Admitting: Hematology and Oncology

## 2021-11-02 ENCOUNTER — Telehealth: Payer: Self-pay | Admitting: Radiation Oncology

## 2021-11-02 NOTE — Telephone Encounter (Signed)
Received Medical Record Request for DICOM Records from [place] Department 10/4. Request forwarded to [employer].

## 2022-01-12 NOTE — Progress Notes (Incomplete)
Erik [name] presents for follow up for Supraglottic cancer. He was treated with radiation therapy that was completed on 06-01-20.   Pain issues, if any: *** Using a feeding tube?: *** Weight changes, if any: *** Swallowing issues, if any: *** Smoking or chewing tobacco? *** Using fluoride trays daily? *** Last ENT visit was on: *** Other notable issues, if any: ***

## 2022-01-16 ENCOUNTER — Telehealth: Payer: Self-pay | Admitting: *Deleted

## 2022-01-16 NOTE — Telephone Encounter (Signed)
CALLED PATIENT TO ASK ABOUT RESCHEDULING FU APPT. ON 01-24-22 DUE TO DR. SQUIRE BEING ON VACATION, RESCHEDULED FOR 02-03-22 @ 11AM, LVM FOR A RETURN CALL

## 2022-01-17 ENCOUNTER — Encounter: Payer: Self-pay | Admitting: Hematology and Oncology

## 2022-01-24 ENCOUNTER — Ambulatory Visit
Admission: RE | Admit: 2022-01-24 | Discharge: 2022-01-24 | Disposition: A | Discharge status: Home / Self Care | Payer: 59 | Source: Ambulatory Visit | Attending: Radiation Oncology | Admitting: Radiation Oncology

## 2022-01-24 DIAGNOSIS — C329 Malignant neoplasm of larynx, unspecified: Secondary | ICD-10-CM

## 2022-01-24 NOTE — Progress Notes (Incomplete)
Erik [name] presents for follow up for Supraglottic cancer. He was treated with radiation therapy that was completed on 06-01-20.    Pain issues, if any: *** Using a feeding tube?: *** Weight changes, if any: *** Swallowing issues, if any: *** Smoking or chewing tobacco? *** Using fluoride trays daily? *** Last ENT visit was on: Dr. Hendricks Limes on 01-31-22 Other notable issues, if any:   Per Dr. Lavonia Dana note on 01-31-22 07/05/21 Ct neck Images and report was reviewed  - NED  07/05/21 CT chest - Stable thoracic inlet, with postsurgical changes and tracheostomy. Two, new small nodules in the right lower lobe, largest with an average diameter 4.5 mm. Short-term follow-up per oncology protocol suggested (for instance 3-6 month CT).  10/20/21 CT neck and chest Images and report was reviewed  - superior mediastinal mass abutting the right trachea  11/22/21 CT neck Images and report was reviewed  - increase in size of the mediastinal tumor   2. Nutrition - continue PO - he has lost weight since I last saw him and he is having more difficulty with swallowing. He would like to avoid G tube. We agree to proceed with dilation one more time with Dr.Madden. If this does not improve his swallowing then we will proceed with G tube.   3. Speech rehab - continue working with SLP on e - larynx - he was interested in TEP/VP - not a candidate right now given recurrent tumor   Scheduled for this due to swallowing concerns Date/TimeTRANSNASAL / ESOPHAGEAL ENDOSCOPY  dysphagia  02/08/2022 1:15 PM EST

## 2022-02-03 ENCOUNTER — Telehealth: Payer: Self-pay

## 2022-02-03 ENCOUNTER — Ambulatory Visit
Admission: RE | Admit: 2022-02-03 | Discharge: 2022-02-03 | Disposition: A | Discharge status: Home / Self Care | Payer: Self-pay | Source: Ambulatory Visit | Attending: Radiation Oncology | Admitting: Radiation Oncology

## 2022-02-03 DIAGNOSIS — C329 Malignant neoplasm of larynx, unspecified: Secondary | ICD-10-CM

## 2022-02-03 NOTE — Telephone Encounter (Signed)
Rn touched base with pt and he stated did not see his appointment on my chart and missed it. Rn extended our thoughts to him as well as he continues his journey with [facility] team for his continued needs. Rn encouraged pt to reach to [name] as needed for follow up and he stated he definitely would. Pt very receptive to encouragement and instructions.

## 2022-02-03 NOTE — Telephone Encounter (Signed)
Rn called pt when he did not show up for his scheduled appointment at [facility] got no answer and voicemail was left. Rn will attempt to call again to make sure pt is ok.

## 2022-04-03 ENCOUNTER — Telehealth: Payer: Self-pay

## 2022-04-03 NOTE — Transitions of Care (Post Inpatient/ED Visit) (Unsigned)
   04/03/2022  Name: Erik [name] MRN: [id number] DOB: [birth year]  Today's TOC FU Call Status: Today's TOC FU Call Status:: Unsuccessul Call (1st Attempt) Unsuccessful Call (1st Attempt) Date: 04/03/22  Attempted to reach the patient regarding the most recent Inpatient/ED visit.  Follow Up Plan: Additional outreach attempts will be made to reach the patient to complete the Transitions of Care (Post Inpatient/ED visit) call.   [place] LPN [place] Advisor Direct Dial [phone]

## 2022-04-04 ENCOUNTER — Telehealth: Payer: Self-pay

## 2022-04-04 NOTE — Transitions of Care (Post Inpatient/ED Visit) (Unsigned)
   04/04/2022  Name: Erik [name] MRN: [id number] DOB: 03-26-1965  Today's TOC FU Call Status: Today's TOC FU Call Status:: Unsuccessul Call (1st Attempt) Unsuccessful Call (1st Attempt) Date: 04/04/22  Attempted to reach the patient regarding the most recent Inpatient/ED visit.  Follow Up Plan: Additional outreach attempts will be made to reach the patient to complete the Transitions of Care (Post Inpatient/ED visit) call.   [place] LPN [place] Advisor Direct Dial [phone]

## 2022-04-04 NOTE — Transitions of Care (Post Inpatient/ED Visit) (Signed)
   04/04/2022  Name: Erik [name] MRN: [id number] DOB: 08/01/1965  Today's TOC FU Call Status: Today's TOC FU Call Status:: Unsuccessful Call (2nd Attempt) Unsuccessful Call (1st Attempt) Date: 04/03/22 Unsuccessful Call (2nd Attempt) Date: 04/04/22  Attempted to reach the patient regarding the most recent Inpatient/ED visit.  Follow Up Plan: No further outreach attempts will be made at this time. We have been unable to contact the patient.  [place] LPN [place] Advisor Direct Dial [phone]

## 2022-04-05 NOTE — Transitions of Care (Post Inpatient/ED Visit) (Signed)
   04/05/2022  Name: Erik [name] MRN: [id number] DOB: 26-Apr-1965  Today's TOC FU Call Status: Today's TOC FU Call Status:: Unsuccessful Call (2nd Attempt) Unsuccessful Call (1st Attempt) Date: 04/04/22 Unsuccessful Call (2nd Attempt) Date: 04/05/22  Attempted to reach the patient regarding the most recent Inpatient/ED visit.  Follow Up Plan: No further outreach attempts will be made at this time. We have been unable to contact the patient.  [place] LPN [place] Advisor Direct Dial [phone]

## 2022-07-06 ENCOUNTER — Encounter: Payer: Self-pay | Admitting: Hematology and Oncology

## 2022-07-06 ENCOUNTER — Encounter (HOSPITAL_COMMUNITY): Payer: Self-pay

## 2022-07-06 ENCOUNTER — Emergency Department (HOSPITAL_COMMUNITY)
Admission: EM | Admit: 2022-07-06 | Discharge: 2022-07-06 | Disposition: A | Discharge status: Home / Self Care | Payer: 59 | Attending: Emergency Medicine | Admitting: Emergency Medicine

## 2022-07-06 DIAGNOSIS — Z711 Person with feared health complaint in whom no diagnosis is made: Secondary | ICD-10-CM

## 2022-07-06 DIAGNOSIS — Z8589 Personal history of malignant neoplasm of other organs and systems: Secondary | ICD-10-CM | POA: Diagnosis not present

## 2022-07-06 DIAGNOSIS — Z87891 Personal history of nicotine dependence: Secondary | ICD-10-CM | POA: Insufficient documentation

## 2022-07-06 DIAGNOSIS — R799 Abnormal finding of blood chemistry, unspecified: Secondary | ICD-10-CM | POA: Diagnosis present

## 2022-07-06 DIAGNOSIS — I2510 Atherosclerotic heart disease of native coronary artery without angina pectoris: Secondary | ICD-10-CM | POA: Diagnosis not present

## 2022-07-06 LAB — CBC WITH DIFFERENTIAL/PLATELET
Abs Immature Granulocytes: 0.02 10*3/uL (ref 0.00–0.07)
Basophils Absolute: 0 10*3/uL (ref 0.0–0.1)
Basophils Relative: 1 %
Eosinophils Absolute: 0.2 10*3/uL (ref 0.0–0.5)
Eosinophils Relative: 4 %
HCT: 35.6 % — ABNORMAL LOW (ref 39.0–52.0)
Hemoglobin: 11.4 g/dL — ABNORMAL LOW (ref 13.0–17.0)
Immature Granulocytes: 0 %
Lymphocytes Relative: 8 %
Lymphs Abs: 0.5 10*3/uL — ABNORMAL LOW (ref 0.7–4.0)
MCH: 28.7 pg (ref 26.0–34.0)
MCHC: 32 g/dL (ref 30.0–36.0)
MCV: 89.7 fL (ref 80.0–100.0)
Monocytes Absolute: 0.5 10*3/uL (ref 0.1–1.0)
Monocytes Relative: 9 %
Neutro Abs: 5 10*3/uL (ref 1.7–7.7)
Neutrophils Relative %: 78 %
Platelets: 326 10*3/uL (ref 150–400)
RBC: 3.97 MIL/uL — ABNORMAL LOW (ref 4.22–5.81)
RDW: 14.5 % (ref 11.5–15.5)
WBC: 6.4 10*3/uL (ref 4.0–10.5)
nRBC: 0 % (ref 0.0–0.2)

## 2022-07-06 LAB — COMPREHENSIVE METABOLIC PANEL
ALT: 33 U/L (ref 0–44)
AST: 40 U/L (ref 15–41)
Albumin: 3.3 g/dL — ABNORMAL LOW (ref 3.5–5.0)
Alkaline Phosphatase: 66 U/L (ref 38–126)
Anion gap: 11 (ref 5–15)
BUN: 24 mg/dL — ABNORMAL HIGH (ref 6–20)
CO2: 27 mmol/L (ref 22–32)
Calcium: 7.1 mg/dL — ABNORMAL LOW (ref 8.9–10.3)
Chloride: 97 mmol/L — ABNORMAL LOW (ref 98–111)
Creatinine, Ser: 0.51 mg/dL — ABNORMAL LOW (ref 0.61–1.24)
GFR, Estimated: >60 mL/min (ref >60)
Glucose, Bld: 81 mg/dL (ref 70–99)
Potassium: 3.7 mmol/L (ref 3.5–5.1)
Sodium: 135 mmol/L (ref 135–145)
Total Bilirubin: 0.4 mg/dL (ref 0.3–1.2)
Total Protein: 7.1 g/dL (ref 6.5–8.1)

## 2022-07-06 LAB — MAGNESIUM: Magnesium: 1.9 mg/dL (ref 1.7–2.4)

## 2022-07-06 MED ORDER — CALCIUM CARBONATE ANTACID 500 MG PO CHEW
800.0000 mg | CHEWABLE_TABLET | Freq: Once | ORAL | Status: AC
  Administered 2022-07-06: 800 mg via ORAL
  Filled 2022-07-06: qty 4

## 2022-07-06 NOTE — Discharge Instructions (Addendum)
We evaluated you for your low magnesium level.  We rechecked your magnesium level in the emergency department and it was normal.  I suspect that your abnormal magnesium level from earlier was a laboratory error.  We did see that your calcium was slightly low on your blood work.  We gave you a dose of calcium in the emergency department.  Please follow-up with your primary doctor for recheck of this.  If you develop any new or worsening symptoms, please return to the emergency department.

## 2022-07-06 NOTE — ED Triage Notes (Signed)
Pt presents with c/o abnormal labs. Pt reports that he was called by his MD and told that his magnesium levels were low. Pt reports he is a cancer pt and is currently receiving immunotherapy.

## 2022-07-06 NOTE — ED Provider Notes (Signed)
[place] EMERGENCY DEPARTMENT AT [facility] Provider Note  CSN: [id number] Arrival date & time: 07/06/22 1748  Chief Complaint(s) Abnormal Lab  HPI Erik [name] is a 57 y.o. 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  He had labs drawn by his oncologist and they called him today saying his magnesium was undetectable and advised him to come to the ER for further evaluation.  He reports he feels essentially at baseline and denies any significant symptoms including no fevers or chills, chest pain, shortness of breath, diarrhea, change in his G-tube feedings, headaches, weakness, seizure, syncope, or any other new symptoms.   Past Medical History Past Medical History:  Diagnosis Date   Coronary artery disease    Dyspnea    GERD (gastroesophageal reflux disease)    Phreesia 02/21/2020   Pneumonia    Ruptured lumbar intervertebral disc    Patient Active Problem List   Diagnosis Date Noted   Protein-calorie malnutrition, severe 08/21/2020   Acute respiratory failure (HCC) 08/15/2020   Sepsis (HCC) 08/14/2020   CAP (community acquired pneumonia) 08/14/2020   Port-A-Cath in place 08/03/2020   Mucositis due to antineoplastic therapy 05/06/2020   Chemotherapy-induced nausea 04/28/2020   Weight loss, unintentional 04/28/2020   Dysgeusia 04/28/2020   G tube feedings (HCC) 04/28/2020   Nicotine abuse 04/21/2020   Head and neck cancer (HCC)    History of tracheostomy 03/16/2020   Malnutrition of moderate degree 03/05/2020   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 Authorizing Provider  calcitRIOL (ROCALTROL) 0.5 MCG capsule Take 0.5 mcg by mouth 2 (two) times daily. 11/22/20   [provider]  levothyroxine (SYNTHROID) 112 MCG tablet Take 1 tablet by mouth daily. 11/23/20 02/21/21  [provider]  Nutritional Supplements (FEEDING SUPPLEMENT, OSMOLITE 1.5 CAL,) LIQD Give 7 cartons of Osmolite 1.5 daily via PEG split into 4 feedings.  Give 60 mL free water flushes before and after bolus feedings with an additional 16 ounces of water via PEG or mouth as tolerated.  This provides 2485 cal and 104.3 g of protein meeting 100% calorie and protein needs. Patient not taking: Reported on 01/14/2021 09/14/20   Lonie Peak, MD  traMADol Janean Sark) 50 MG tablet Take 1 tablet by mouth daily as needed. 01/06/21   [provider]                                                                                                                                    Past Surgical History Past Surgical History:  Procedure Laterality Date   CARDIAC CATHETERIZATION     with stent placement   IR GASTROSTOMY TUBE MOD SED  04/20/2020   IR GASTROSTOMY TUBE REMOVAL  01/26/2021   IR IMAGING GUIDED PORT INSERTION  04/20/2020   IR  REMOVAL TUN ACCESS W/ PORT W/O FL MOD SED  04/21/2021   TONSILLECTOMY     TRACHEOSTOMY TUBE PLACEMENT N/A 03/05/2020   Procedure: AWAKE TRACHEOSTOMY, DIRECT LARYNGOSCOPY WITH BIOPSY;  Surgeon: Christia Reading, MD;  Location: [facility]S;  Service: ENT;  Laterality: N/A;   Family History History reviewed. No pertinent family history.  Social History Social History   Tobacco Use   Smoking status: Former    Types: Cigarettes    Quit date: 03/30/2020    Years since quitting: 2.2   Smokeless tobacco: Former    Types: Associate Professor Use: Former  Substance Use Topics   Alcohol use: No   Drug use: Yes    Types: Marijuana   Allergies Patient has no known allergies.  Review of Systems Review of Systems  All other systems reviewed and are negative.   Physical Exam Vital Signs  I have reviewed the triage vital signs BP 118/74   Pulse 64   Temp 98.8 F (37.1 C) (Oral)   Resp 14   SpO2 96%  Physical Exam Vitals and nursing note reviewed.  Constitutional:       General: He is not in acute distress.    Appearance: Normal appearance.  HENT:     Mouth/Throat:     Mouth: Mucous membranes are moist.  Eyes:     Conjunctiva/sclera: Conjunctivae normal.  Cardiovascular:     Rate and Rhythm: Normal rate and regular rhythm.  Pulmonary:     Effort: Pulmonary effort is normal. No respiratory distress.     Breath sounds: Normal breath sounds.  Abdominal:     General: Abdomen is flat.     Palpations: Abdomen is soft.     Tenderness: There is no abdominal tenderness.  Musculoskeletal:     Right lower leg: No edema.     Left lower leg: No edema.  Skin:    General: Skin is warm and dry.     Capillary Refill: Capillary refill takes less than 2 seconds.  Neurological:     Mental Status: He is alert and oriented to person, place, and time. Mental status is at baseline.  Psychiatric:        Mood and Affect: Mood normal.        Behavior: Behavior normal.     ED Results and Treatments Labs (all labs ordered are listed, but only abnormal results are displayed) Labs Reviewed  COMPREHENSIVE METABOLIC PANEL - Abnormal; Notable for the following components:      Result Value   Chloride 97 (*)    BUN 24 (*)    Creatinine, Ser 0.51 (*)    Calcium 7.1 (*)    Albumin 3.3 (*)    All other components within normal limits  CBC WITH DIFFERENTIAL/PLATELET - Abnormal; Notable for the following components:   RBC 3.97 (*)    Hemoglobin 11.4 (*)    HCT 35.6 (*)    Lymphs Abs 0.5 (*)    All other components within normal limits  MAGNESIUM  Radiology No results found.  Pertinent labs & imaging results that were available during my care of the patient were reviewed by me and considered in my medical decision making (see MDM for details).  Medications Ordered in ED Medications  calcium carbonate (TUMS - dosed in mg elemental calcium)  chewable tablet 800 mg of elemental calcium (has no administration in time range)                                                                                                                                     Procedures Procedures  (including critical care time)  Medical Decision Making / ED Course   MDM:  57 year old male presenting to the emergency department with abnormal lab.  Patient well-appearing, physical exam unremarkable, patient denies any symptoms.  Obtained recheck of his laboratory testing here which shows normal magnesium.  BMP shows mildly low calcium but otherwise no evidence of acute process. Does have mildly prolonged QT but potassium and magnesium reassuring. Will give dose of calcium carbonate here.  Advise follow-up with his oncologist for further management.      Additional history obtained: -Additional history obtained from spouse -External records from outside source obtained and reviewed including: Chart review including previous notes, labs, imaging, consultation notes including oncology notes and outside lab from today   Lab Tests: -I ordered, reviewed, and interpreted labs.   The pertinent results include:   Labs Reviewed  COMPREHENSIVE METABOLIC PANEL - Abnormal; Notable for the following components:      Result Value   Chloride 97 (*)    BUN 24 (*)    Creatinine, Ser 0.51 (*)    Calcium 7.1 (*)    Albumin 3.3 (*)    All other components within normal limits  CBC WITH DIFFERENTIAL/PLATELET - Abnormal; Notable for the following components:   RBC 3.97 (*)    Hemoglobin 11.4 (*)    HCT 35.6 (*)    Lymphs Abs 0.5 (*)    All other components within normal limits  MAGNESIUM    Notable for normal magnesium. Mild hypocalcemia  EKG   EKG Interpretation  Date/Time:  Thursday July 06 2022 20:54:17 EDT Ventricular Rate:  60 PR Interval:  134 QRS Duration: 112 QT Interval:  525 QTC Calculation: 525 R Axis:   93 Text  Interpretation: Sinus rhythm Borderline intraventricular conduction delay Prolonged QT interval Confirmed by Alvino Blood ([id number]) on 07/06/2022 9:02:04 PM         Medicines ordered and prescription drug management: Meds ordered this encounter  Medications   calcium carbonate (TUMS - dosed in mg elemental calcium) chewable tablet 800 mg of elemental calcium    -I have reviewed the patients home medicines and have made adjustments as needed    Cardiac Monitoring: The patient was maintained on a cardiac monitor.  I personally viewed and interpreted the cardiac monitored which showed an underlying rhythm of: NSR  Social  Determinants of Health:  Diagnosis or treatment significantly limited by social determinants of health: former smoker  Co morbidities that complicate the patient evaluation  Past Medical History:  Diagnosis Date   Coronary artery disease    Dyspnea    GERD (gastroesophageal reflux disease)    Phreesia 02/21/2020   Pneumonia    Ruptured lumbar intervertebral disc       Dispostion: Disposition decision including need for hospitalization was considered, and patient discharged from emergency department.    Final Clinical Impression(s) / ED Diagnoses Final diagnoses:  Person with feared complaint, no diagnosis made  Hypocalcemia     This chart was dictated using voice recognition software.  Despite best efforts to proofread,  errors can occur which can change the documentation meaning.    Lonell Grandchild, MD 07/06/22 2241

## 2022-08-14 IMAGING — XA IR PERC PLACEMENT GASTROSTOMY
3 series · 6 of 6 positions shown · non-contrast
Comparison: none

INDICATION: Laryngeal malignancy

[Series 1: care single · 2 of 2 slices shown (1 of 3)]
[im 1/2]
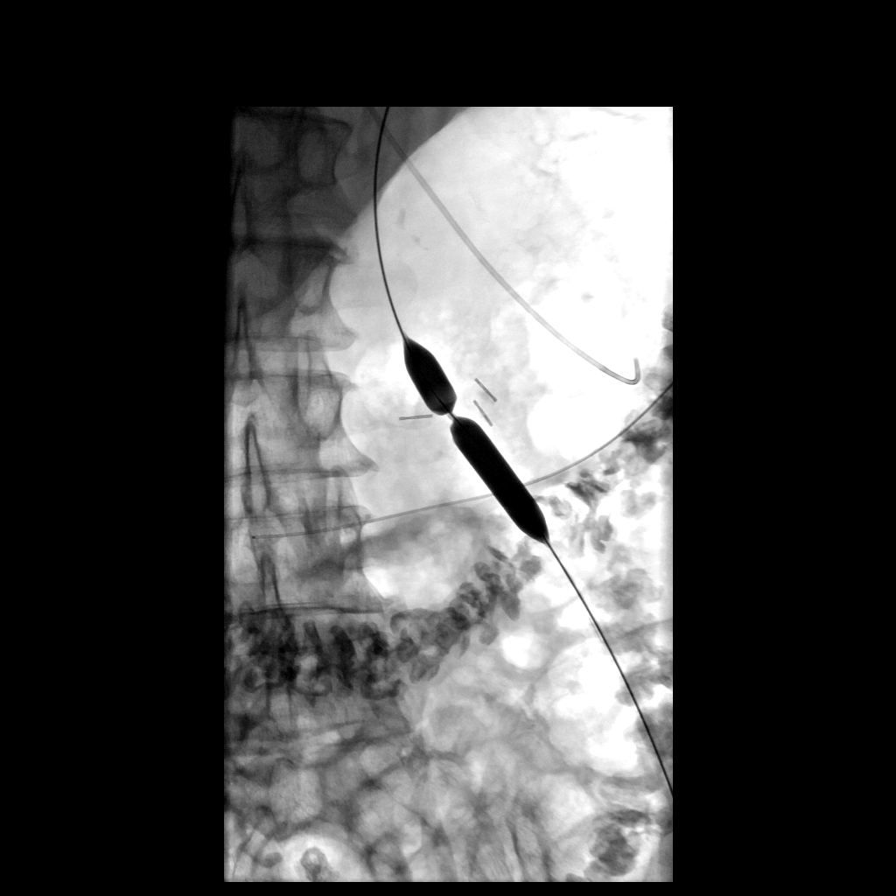
[im 2/2]
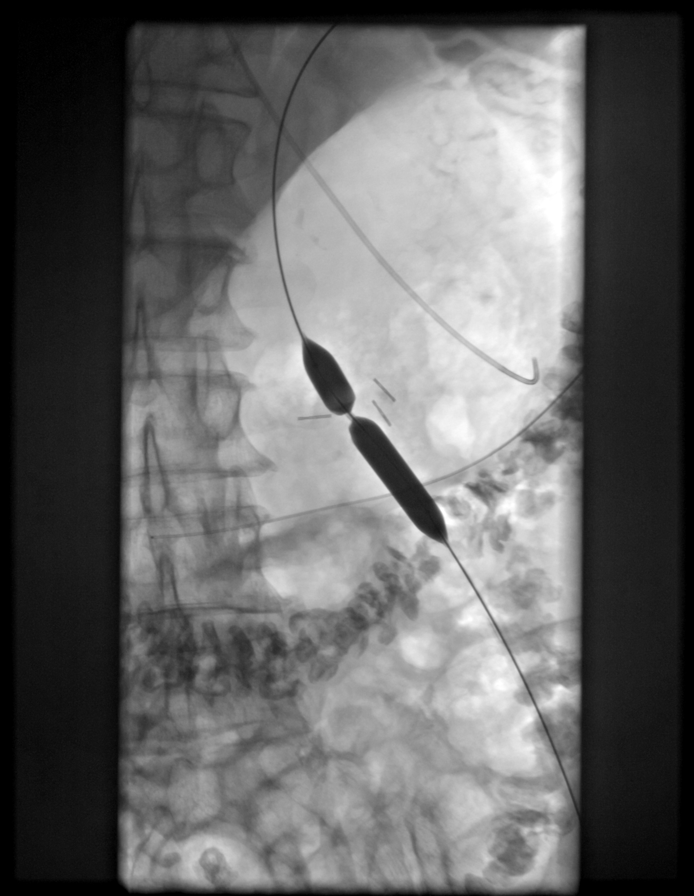

[Series 2: care single · 2 of 2 slices shown (2 of 3)]
[im 1/2]
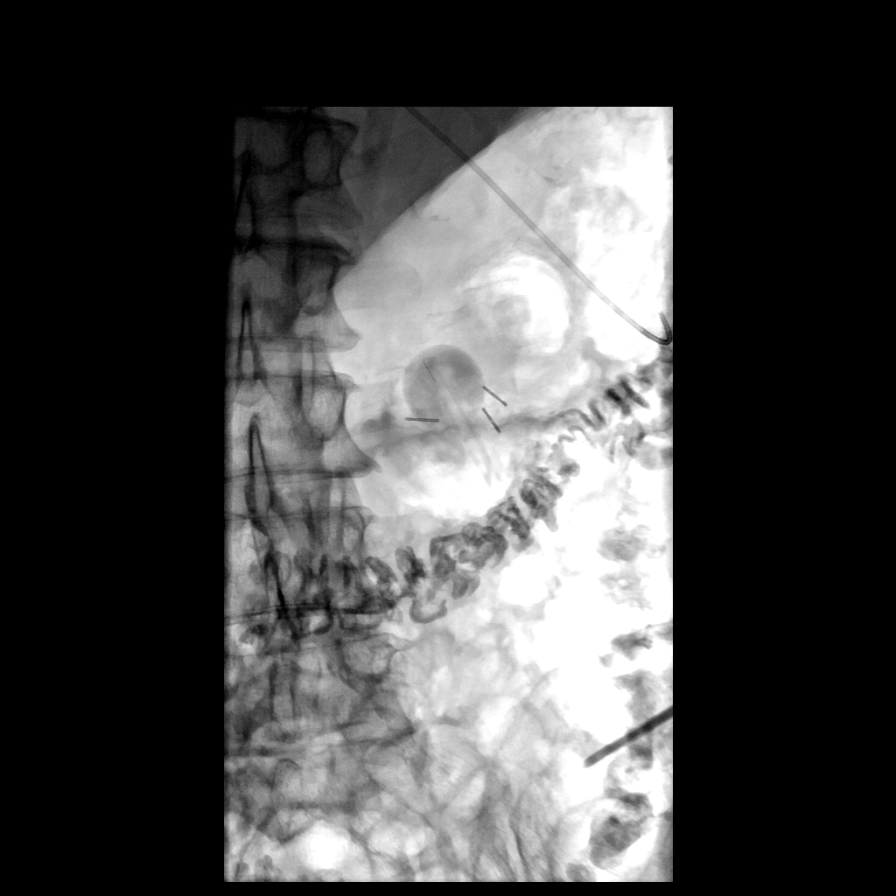
[im 2/2]
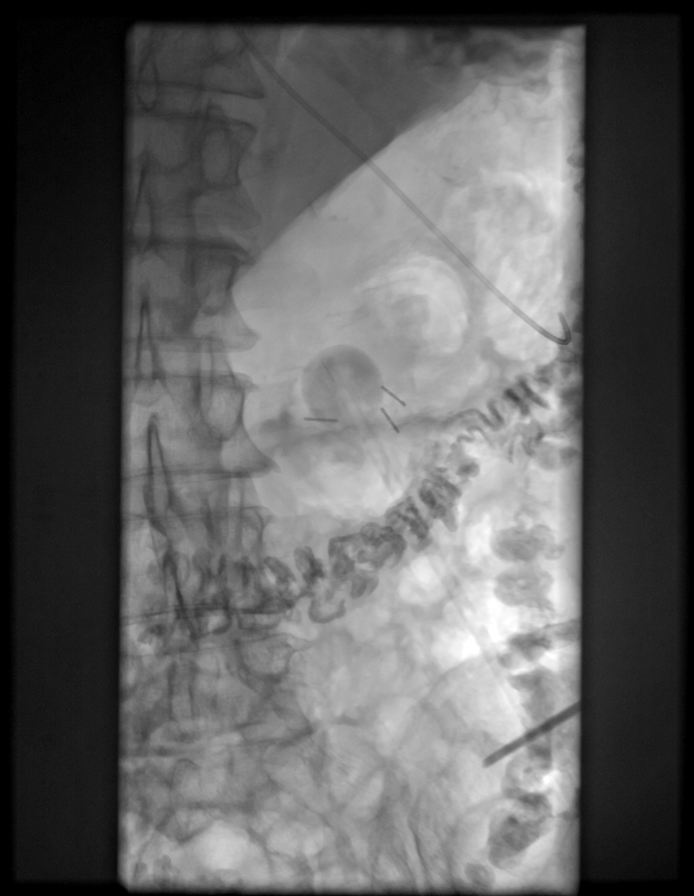

[Series 3: care single · 2 of 2 slices shown (3 of 3)]
[im 1/2]
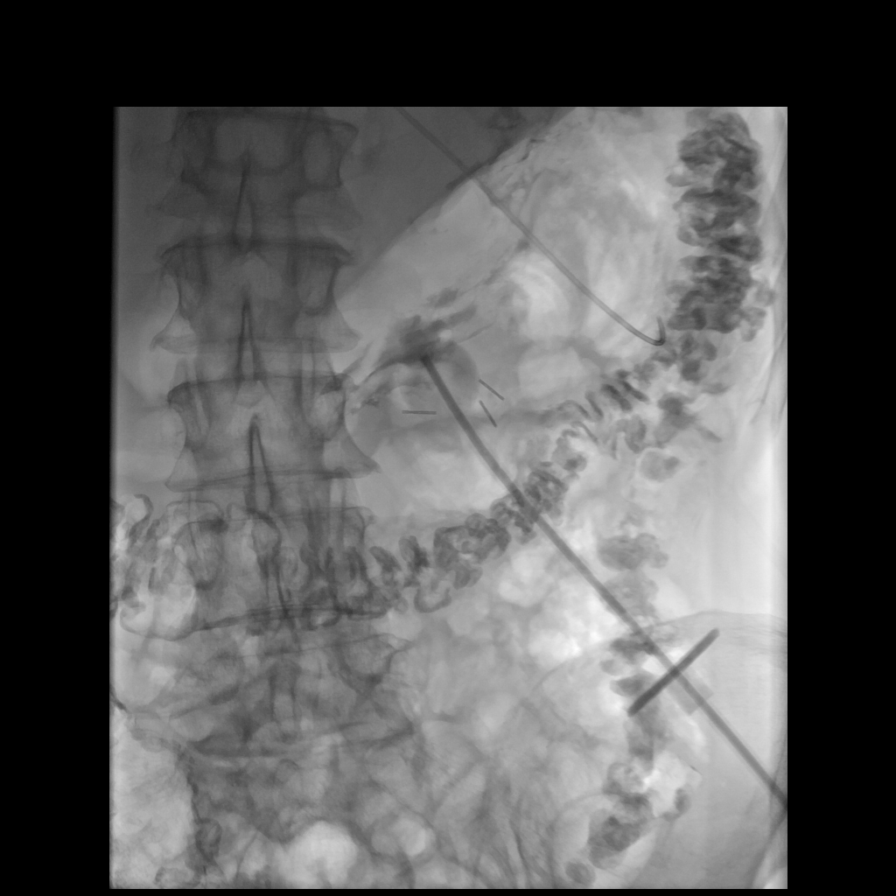
[im 2/2]
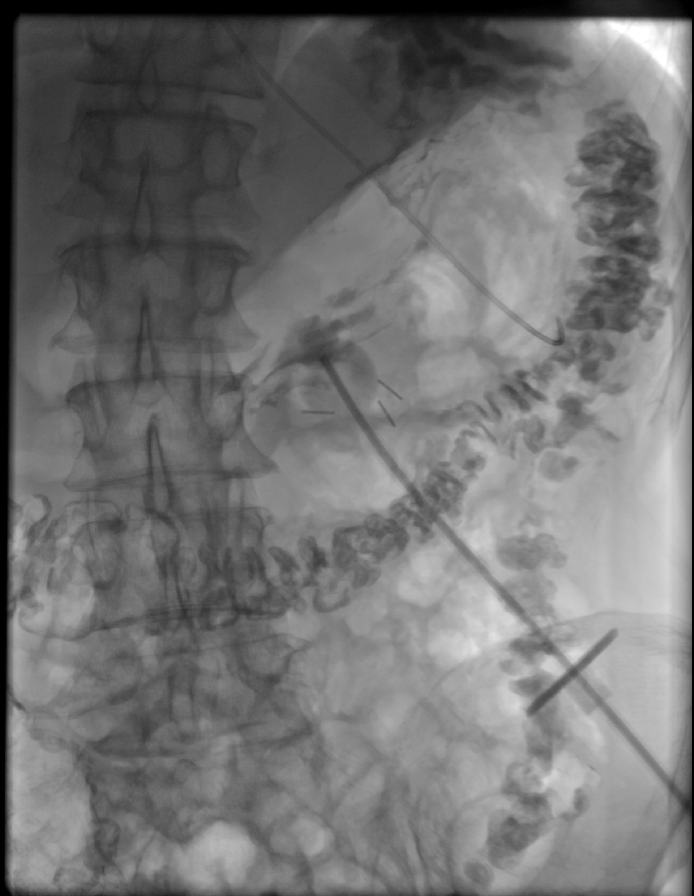

[6 of 6 positions shown; findings below may reference images not displayed]

EXAM:
Percutaneous gastrostomy tube placement

MEDICATIONS:
Ancef 2 g IV; Antibiotics were administered within 1 hour of the
procedure. Glucagon 1 mg IV

ANESTHESIA/SEDATION:
Versed 3 mg IV; Fentanyl 100 mcg IV

Moderate Sedation Time:  20

The patient was continuously monitored during the procedure by the
interventional radiology nurse under my direct supervision.

CONTRAST:  10 mL of Omnipaque-administered into the gastric lumen.

FLUOROSCOPY TIME:  Fluoroscopy Time: 3 minutes 30 seconds (28 mGy).

COMPLICATIONS:
None immediate.

PROCEDURE:
Informed written consent was obtained from the patient after a
thorough discussion of the procedural risks, benefits and
alternatives. All questions were addressed. Maximal Sterile Barrier
Technique was utilized including caps, mask, sterile gowns, sterile
gloves, sterile drape, hand hygiene and skin antiseptic. A timeout
was performed prior to the initiation of the procedure.

The left hepatic lobe margin was marked utilizing ultrasound
guidance.

The epigastric region was prepped and draped in the usual sterile
fashion.

The stomach was insufflated utilizing the NG tube.

Following local lidocaine administration, three gastropexies were
placed to secure the anterior wall of the stomach to the anterior
abdominal wall. Percutaneous access obtained into the gastric antrum
at the center of the gastropexies with an 18 gauge needle. Guide
wire advanced into the gastric lumen.

Gastrostomy tube placed on the guidewire. 10 mm balloon loaded on
the guidewire and advanced through the gastrostomy tube.

10 mm balloon insufflated in the anterior abdominal/gastric wall and
both balloon and 16 French gastrostomy tube inserted into the
gastric lumen.

The 10 mm balloon was removed.

The G tube retention balloon was inflated with 7 mL of dilute
contrast and retracted to the anterior gastric wall. Contrast
administrated through the gastrostomy tube opacified the gastric
lumen.

The insertion site was covered with sterile dressing.
IMPRESSION: Fluoroscopy guided percutaneous gastrostomy tube placement as above.

## 2022-08-14 IMAGING — XA IR IMAGING GUIDED PORT INSERTION
1 series · 1 of 1 positions shown · non-contrast
Comparison: none

INDICATION: Nasopharyngeal carcinoma

[Series 300: ir imaging guided port insertion · 1 of 1 slices shown]
[im 1/1]
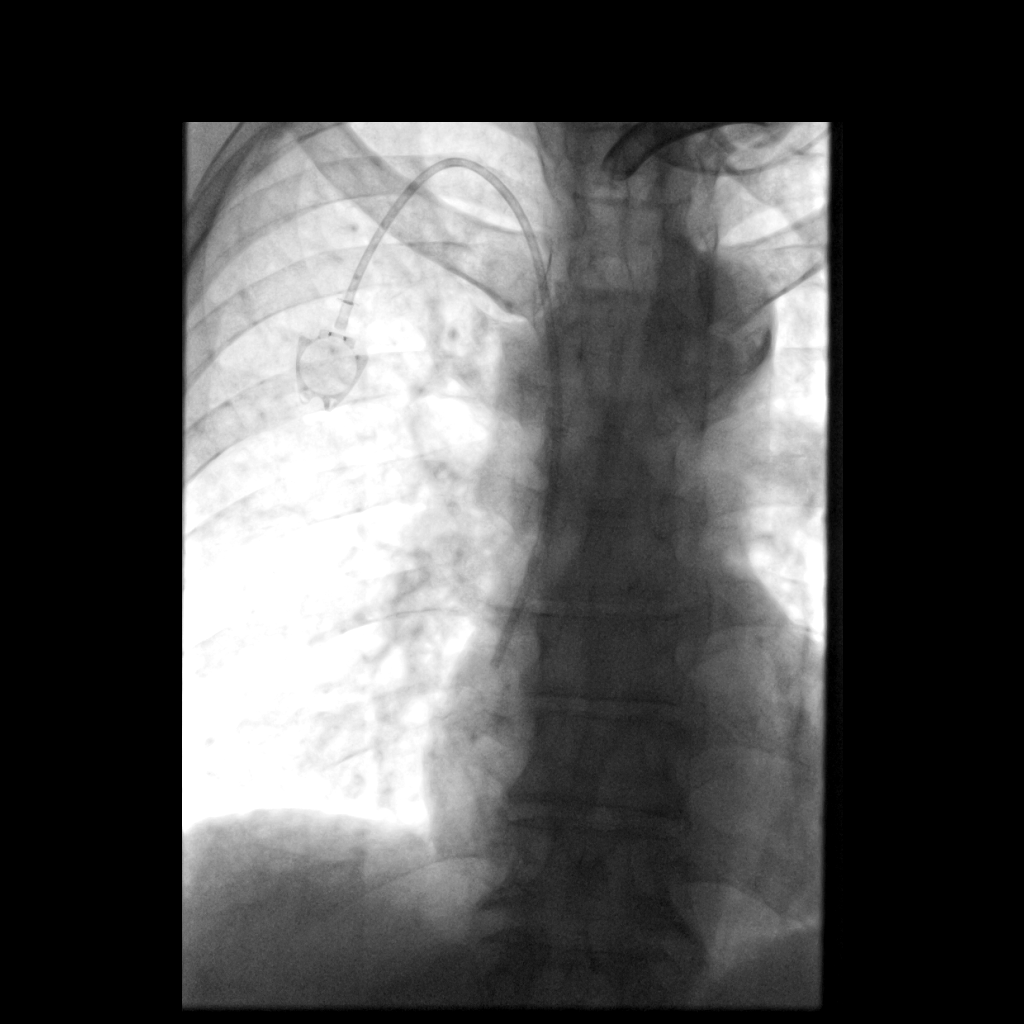

[1 of 1 positions shown; findings below may reference images not displayed]

EXAM:
IMPLANTED PORT A CATH PLACEMENT WITH ULTRASOUND AND FLUOROSCOPIC
GUIDANCE

MEDICATIONS:
None

ANESTHESIA/SEDATION:
Moderate (conscious) sedation was employed during this procedure. A
total of Versed 3 mg and Fentanyl 100 mcg was administered
intravenously.

Moderate Sedation Time: 21 minutes. The patient's level of
consciousness and vital signs were monitored continuously by
radiology nursing throughout the procedure under my direct
supervision.

FLUOROSCOPY TIME:  0 minutes, 45 seconds (3 mGy)

COMPLICATIONS:
None immediate.

PROCEDURE:
The procedure, risks, benefits, and alternatives were explained to
the patient. Questions regarding the procedure were encouraged and
answered. The patient understands and consents to the procedure.

A timeout was performed prior to the initiation of the procedure.

Patient positioned supine on the angiography table.

Right neck and anterior upper chest prepped and draped in the usual
sterile fashion. All elements of maximal sterile barrier were
utilized including, cap, mask, sterile gown, sterile gloves, large
sterile drape, hand scrubbing and 2% Chlorhexidine for skin
cleaning.

The right internal jugular vein was evaluated with ultrasound and
shown to be patent. A permanent ultrasound image was obtained and
placed in the patient's medical record. Local anesthesia was
provided with 1% lidocaine with epinephrine.

Using sterile gel and a sterile probe cover, the right internal
jugular vein was entered with a 21 ga needle during real time
ultrasound guidance.

0.018 inch guidewire placed and 21 ga needle exchanged for
transitional dilator set. Utilizing fluoroscopy, 0.035 inch
guidewire advanced through the needle without difficulty.

Attention then turned to the right anterior upper chest. Following
local lidocaine administration, a port pocket was created. The
catheter was connected to the port and brought from the pocket to
the venotomy site through a subcutaneous tunnel.

The catheter was cut to size and inserted through the peel-away
sheath. The catheter tip was positioned at the cavoatrial junction
using fluoroscopic guidance.

The port aspirated and flushed well. The port pocket was closed with
deep and superficial absorbable suture. The port pocket incision and
venotomy sites were also sealed with Dermabond.
IMPRESSION: Successful placement of a right internal jugular approach power
injectable Port-A-Cath. The catheter is ready for immediate use.

## 2023-03-02 ENCOUNTER — Inpatient Hospital Stay (HOSPITAL_COMMUNITY)
Admission: EM | Admit: 2023-03-02 | Discharge: 2023-03-07 | DRG: 871 | Disposition: A | Discharge status: Hospice | Payer: MEDICAID | Attending: Internal Medicine | Admitting: Internal Medicine

## 2023-03-02 ENCOUNTER — Encounter (HOSPITAL_COMMUNITY): Payer: Self-pay

## 2023-03-02 ENCOUNTER — Emergency Department (HOSPITAL_COMMUNITY): Payer: Self-pay

## 2023-03-02 ENCOUNTER — Encounter: Payer: Self-pay | Admitting: Hematology and Oncology

## 2023-03-02 ENCOUNTER — Other Ambulatory Visit: Payer: Self-pay

## 2023-03-02 DIAGNOSIS — E039 Hypothyroidism, unspecified: Secondary | ICD-10-CM | POA: Diagnosis present

## 2023-03-02 DIAGNOSIS — J9621 Acute and chronic respiratory failure with hypoxia: Secondary | ICD-10-CM | POA: Diagnosis present

## 2023-03-02 DIAGNOSIS — J860 Pyothorax with fistula: Secondary | ICD-10-CM | POA: Diagnosis present

## 2023-03-02 DIAGNOSIS — J440 Chronic obstructive pulmonary disease with acute lower respiratory infection: Secondary | ICD-10-CM | POA: Diagnosis present

## 2023-03-02 DIAGNOSIS — R1310 Dysphagia, unspecified: Secondary | ICD-10-CM | POA: Diagnosis present

## 2023-03-02 DIAGNOSIS — K219 Gastro-esophageal reflux disease without esophagitis: Secondary | ICD-10-CM | POA: Diagnosis present

## 2023-03-02 DIAGNOSIS — J690 Pneumonitis due to inhalation of food and vomit: Secondary | ICD-10-CM | POA: Diagnosis present

## 2023-03-02 DIAGNOSIS — J189 Pneumonia, unspecified organism: Secondary | ICD-10-CM | POA: Diagnosis present

## 2023-03-02 DIAGNOSIS — E872 Acidosis, unspecified: Secondary | ICD-10-CM | POA: Diagnosis present

## 2023-03-02 DIAGNOSIS — E876 Hypokalemia: Secondary | ICD-10-CM | POA: Diagnosis present

## 2023-03-02 DIAGNOSIS — Z515 Encounter for palliative care: Secondary | ICD-10-CM | POA: Diagnosis not present

## 2023-03-02 DIAGNOSIS — Z930 Tracheostomy status: Secondary | ICD-10-CM

## 2023-03-02 DIAGNOSIS — Z95828 Presence of other vascular implants and grafts: Secondary | ICD-10-CM

## 2023-03-02 DIAGNOSIS — J219 Acute bronchiolitis, unspecified: Secondary | ICD-10-CM | POA: Diagnosis present

## 2023-03-02 DIAGNOSIS — J9620 Acute and chronic respiratory failure, unspecified whether with hypoxia or hypercapnia: Secondary | ICD-10-CM | POA: Diagnosis present

## 2023-03-02 DIAGNOSIS — Z1152 Encounter for screening for COVID-19: Secondary | ICD-10-CM

## 2023-03-02 DIAGNOSIS — Z931 Gastrostomy status: Secondary | ICD-10-CM

## 2023-03-02 DIAGNOSIS — I2510 Atherosclerotic heart disease of native coronary artery without angina pectoris: Secondary | ICD-10-CM | POA: Diagnosis present

## 2023-03-02 DIAGNOSIS — Z681 Body mass index (BMI) 19 or less, adult: Secondary | ICD-10-CM

## 2023-03-02 DIAGNOSIS — Z8521 Personal history of malignant neoplasm of larynx: Secondary | ICD-10-CM

## 2023-03-02 DIAGNOSIS — E8809 Other disorders of plasma-protein metabolism, not elsewhere classified: Secondary | ICD-10-CM | POA: Diagnosis present

## 2023-03-02 DIAGNOSIS — E440 Moderate protein-calorie malnutrition: Secondary | ICD-10-CM | POA: Diagnosis present

## 2023-03-02 DIAGNOSIS — Z79890 Hormone replacement therapy (postmenopausal): Secondary | ICD-10-CM

## 2023-03-02 DIAGNOSIS — R6520 Severe sepsis without septic shock: Principal | ICD-10-CM | POA: Diagnosis present

## 2023-03-02 DIAGNOSIS — I959 Hypotension, unspecified: Secondary | ICD-10-CM | POA: Diagnosis present

## 2023-03-02 DIAGNOSIS — G8929 Other chronic pain: Secondary | ICD-10-CM | POA: Diagnosis present

## 2023-03-02 DIAGNOSIS — Z7189 Other specified counseling: Secondary | ICD-10-CM

## 2023-03-02 DIAGNOSIS — J9601 Acute respiratory failure with hypoxia: Secondary | ICD-10-CM

## 2023-03-02 DIAGNOSIS — J470 Bronchiectasis with acute lower respiratory infection: Secondary | ICD-10-CM | POA: Diagnosis present

## 2023-03-02 DIAGNOSIS — E162 Hypoglycemia, unspecified: Secondary | ICD-10-CM | POA: Diagnosis present

## 2023-03-02 DIAGNOSIS — R64 Cachexia: Secondary | ICD-10-CM | POA: Diagnosis present

## 2023-03-02 DIAGNOSIS — J449 Chronic obstructive pulmonary disease, unspecified: Secondary | ICD-10-CM

## 2023-03-02 DIAGNOSIS — Z720 Tobacco use: Secondary | ICD-10-CM

## 2023-03-02 DIAGNOSIS — A419 Sepsis, unspecified organism: Secondary | ICD-10-CM | POA: Diagnosis present

## 2023-03-02 DIAGNOSIS — Z66 Do not resuscitate: Secondary | ICD-10-CM | POA: Diagnosis present

## 2023-03-02 DIAGNOSIS — R7401 Elevation of levels of liver transaminase levels: Secondary | ICD-10-CM | POA: Diagnosis present

## 2023-03-02 LAB — CBC WITH DIFFERENTIAL/PLATELET
Abs Immature Granulocytes: 0 10*3/uL (ref 0.00–0.07)
Basophils Absolute: 0 10*3/uL (ref 0.0–0.1)
Basophils Relative: 0 %
Eosinophils Absolute: 0 10*3/uL (ref 0.0–0.5)
Eosinophils Relative: 0 %
HCT: 31.7 % — ABNORMAL LOW (ref 39.0–52.0)
Hemoglobin: 9.6 g/dL — ABNORMAL LOW (ref 13.0–17.0)
Lymphocytes Relative: 1 %
Lymphs Abs: 0.2 10*3/uL — ABNORMAL LOW (ref 0.7–4.0)
MCH: 27.7 pg (ref 26.0–34.0)
MCHC: 30.3 g/dL (ref 30.0–36.0)
MCV: 91.4 fL (ref 80.0–100.0)
Monocytes Absolute: 0.5 10*3/uL (ref 0.1–1.0)
Monocytes Relative: 3 %
Neutro Abs: 14.9 10*3/uL — ABNORMAL HIGH (ref 1.7–7.7)
Neutrophils Relative %: 96 %
Platelets: 570 10*3/uL — ABNORMAL HIGH (ref 150–400)
RBC: 3.47 MIL/uL — ABNORMAL LOW (ref 4.22–5.81)
RDW: 16 % — ABNORMAL HIGH (ref 11.5–15.5)
WBC: 15.5 10*3/uL — ABNORMAL HIGH (ref 4.0–10.5)
nRBC: 0 % (ref 0.0–0.2)
nRBC: 1 /100{WBCs} — ABNORMAL HIGH

## 2023-03-02 LAB — HIV ANTIBODY (ROUTINE TESTING W REFLEX): HIV Screen 4th Generation wRfx: NONREACTIVE

## 2023-03-02 LAB — I-STAT CG4 LACTIC ACID, ED: Lactic Acid, Venous: 2.4 mmol/L (ref 0.5–1.9)

## 2023-03-02 LAB — RESP PANEL BY RT-PCR (RSV, FLU A&B, COVID)  RVPGX2
Influenza A by PCR: NEGATIVE
Influenza B by PCR: NEGATIVE
Resp Syncytial Virus by PCR: NEGATIVE
SARS Coronavirus 2 by RT PCR: NEGATIVE

## 2023-03-02 LAB — COMPREHENSIVE METABOLIC PANEL
ALT: 151 U/L — ABNORMAL HIGH (ref 0–44)
AST: 139 U/L — ABNORMAL HIGH (ref 15–41)
Albumin: 2 g/dL — ABNORMAL LOW (ref 3.5–5.0)
Alkaline Phosphatase: 263 U/L — ABNORMAL HIGH (ref 38–126)
Anion gap: 9 (ref 5–15)
BUN: 27 mg/dL — ABNORMAL HIGH (ref 6–20)
CO2: 33 mmol/L — ABNORMAL HIGH (ref 22–32)
Calcium: 9.4 mg/dL (ref 8.9–10.3)
Chloride: 99 mmol/L (ref 98–111)
Creatinine, Ser: 0.63 mg/dL (ref 0.61–1.24)
GFR, Estimated: >60 mL/min (ref >60)
Glucose, Bld: 96 mg/dL (ref 70–99)
Potassium: 3.6 mmol/L (ref 3.5–5.1)
Sodium: 141 mmol/L (ref 135–145)
Total Bilirubin: 0.9 mg/dL (ref 0.0–1.2)
Total Protein: 6.6 g/dL (ref 6.5–8.1)

## 2023-03-02 LAB — TROPONIN I (HIGH SENSITIVITY)
Troponin I (High Sensitivity): 21 ng/L — ABNORMAL HIGH (ref <18)
Troponin I (High Sensitivity): 18 ng/L — ABNORMAL HIGH (ref <18)

## 2023-03-02 LAB — MAGNESIUM: Magnesium: 1.6 mg/dL — ABNORMAL LOW (ref 1.7–2.4)

## 2023-03-02 LAB — APTT: aPTT: >200 s (ref 24–36)

## 2023-03-02 LAB — PROTIME-INR
INR: 1.5 — ABNORMAL HIGH (ref 0.8–1.2)
Prothrombin Time: 17.9 s — ABNORMAL HIGH (ref 11.4–15.2)

## 2023-03-02 LAB — LACTIC ACID, PLASMA: Lactic Acid, Venous: 5.2 mmol/L (ref 0.5–1.9)

## 2023-03-02 LAB — PHOSPHORUS: Phosphorus: 4.3 mg/dL (ref 2.5–4.6)

## 2023-03-02 MED ORDER — ACETAMINOPHEN 325 MG PO TABS: 650.0000 mg | ORAL_TABLET | Freq: Four times a day (QID) | ORAL | Status: DC | PRN

## 2023-03-02 MED ORDER — SODIUM CHLORIDE 0.9 % IV SOLN
2.0000 g | Freq: Three times a day (TID) | INTRAVENOUS | Status: DC
  Administered 2023-03-02 – 2023-03-07 (×15): 2 g via INTRAVENOUS
  Filled 2023-03-02 (×15): qty 12.5

## 2023-03-02 MED ORDER — PIVOT 1.5 CAL PO LIQD
1000.0000 mL | ORAL | Status: DC
  Administered 2023-03-03: 1000 mL
  Filled 2023-03-02: qty 1000

## 2023-03-02 MED ORDER — PANTOPRAZOLE SODIUM 40 MG IV SOLR
40.0000 mg | Freq: Two times a day (BID) | INTRAVENOUS | Status: DC
  Administered 2023-03-02 – 2023-03-07 (×11): 40 mg via INTRAVENOUS
  Filled 2023-03-02 (×11): qty 10

## 2023-03-02 MED ORDER — LACTATED RINGERS IV BOLUS (SEPSIS)
250.0000 mL | Freq: Once | INTRAVENOUS | Status: AC
Start: 2023-03-02 — End: 2023-03-02
  Administered 2023-03-02: 250 mL via INTRAVENOUS

## 2023-03-02 MED ORDER — LACTATED RINGERS IV BOLUS (SEPSIS)
1000.0000 mL | Freq: Once | INTRAVENOUS | Status: AC
Start: 2023-03-02 — End: 2023-03-02
  Administered 2023-03-02: 1000 mL via INTRAVENOUS

## 2023-03-02 MED ORDER — VANCOMYCIN HCL 750 MG/150ML IV SOLN
750.0000 mg | Freq: Two times a day (BID) | INTRAVENOUS | Status: DC
  Filled 2023-03-02: qty 150

## 2023-03-02 MED ORDER — LACTATED RINGERS IV BOLUS (SEPSIS)
500.0000 mL | Freq: Once | INTRAVENOUS | Status: AC
  Administered 2023-03-02: 500 mL via INTRAVENOUS

## 2023-03-02 MED ORDER — PROSOURCE TF20 ENFIT COMPATIBL EN LIQD
60.0000 mL | Freq: Every day | ENTERAL | Status: DC
  Administered 2023-03-03 – 2023-03-07 (×3): 60 mL
  Filled 2023-03-02 (×3): qty 60

## 2023-03-02 MED ORDER — ACETAMINOPHEN 650 MG RE SUPP: 650.0000 mg | Freq: Four times a day (QID) | RECTAL | Status: DC | PRN

## 2023-03-02 MED ORDER — METRONIDAZOLE 500 MG/100ML IV SOLN
500.0000 mg | Freq: Two times a day (BID) | INTRAVENOUS | Status: DC
  Administered 2023-03-02 – 2023-03-07 (×10): 500 mg via INTRAVENOUS
  Filled 2023-03-02 (×10): qty 100

## 2023-03-02 MED ORDER — SODIUM CHLORIDE 0.9 % IV SOLN
2.0000 g | Freq: Once | INTRAVENOUS | Status: AC
  Administered 2023-03-02: 2 g via INTRAVENOUS
  Filled 2023-03-02: qty 12.5

## 2023-03-02 MED ORDER — LEVOTHYROXINE SODIUM 50 MCG PO TABS
250.0000 ug | ORAL_TABLET | Freq: Every day | ORAL | Status: DC
  Administered 2023-03-02 – 2023-03-07 (×4): 250 ug via JEJUNOSTOMY
  Filled 2023-03-02: qty 2
  Filled 2023-03-02: qty 1
  Filled 2023-03-02: qty 2
  Filled 2023-03-02: qty 1

## 2023-03-02 MED ORDER — IPRATROPIUM-ALBUTEROL 0.5-2.5 (3) MG/3ML IN SOLN
3.0000 mL | Freq: Once | RESPIRATORY_TRACT | Status: AC
  Administered 2023-03-02: 3 mL via RESPIRATORY_TRACT
  Filled 2023-03-02: qty 3

## 2023-03-02 MED ORDER — HYDROMORPHONE HCL 2 MG PO TABS: 8.0000 mg | ORAL_TABLET | ORAL | Status: DC | PRN

## 2023-03-02 MED ORDER — CALCITRIOL 1 MCG/ML PO SOLN
0.5000 ug | Freq: Two times a day (BID) | ORAL | Status: DC
  Administered 2023-03-02 – 2023-03-07 (×8): 0.5 ug
  Filled 2023-03-02 (×12): qty 0.5

## 2023-03-02 MED ORDER — VANCOMYCIN HCL IN DEXTROSE 1-5 GM/200ML-% IV SOLN
1000.0000 mg | Freq: Once | INTRAVENOUS | Status: DC
  Filled 2023-03-02: qty 200

## 2023-03-02 MED ORDER — LACTATED RINGERS IV SOLN: INTRAVENOUS | Status: AC

## 2023-03-02 MED ORDER — SODIUM CHLORIDE 0.9 % IV SOLN
750.0000 mg | Freq: Two times a day (BID) | INTRAVENOUS | Status: DC
  Administered 2023-03-02 – 2023-03-03 (×3): 750 mg via INTRAVENOUS
  Filled 2023-03-02 (×5): qty 15

## 2023-03-02 MED ORDER — ENOXAPARIN SODIUM 30 MG/0.3ML IJ SOSY
30.0000 mg | PREFILLED_SYRINGE | INTRAMUSCULAR | Status: DC
Start: 2023-03-02 — End: 2023-03-02
  Administered 2023-03-02: 30 mg via SUBCUTANEOUS
  Filled 2023-03-02: qty 0.3

## 2023-03-02 MED ORDER — THIAMINE MONONITRATE 100 MG PO TABS
100.0000 mg | ORAL_TABLET | Freq: Every day | ORAL | Status: DC
  Administered 2023-03-02 – 2023-03-07 (×4): 100 mg
  Filled 2023-03-02 (×4): qty 1

## 2023-03-02 MED ORDER — ENOXAPARIN SODIUM 40 MG/0.4ML IJ SOSY
40.0000 mg | PREFILLED_SYRINGE | INTRAMUSCULAR | Status: DC
  Administered 2023-03-03 – 2023-03-07 (×5): 40 mg via SUBCUTANEOUS
  Filled 2023-03-02 (×5): qty 0.4

## 2023-03-02 MED ORDER — MIDODRINE HCL 5 MG PO TABS
10.0000 mg | ORAL_TABLET | Freq: Three times a day (TID) | ORAL | Status: DC
Start: 2023-03-02 — End: 2023-03-07
  Administered 2023-03-02 – 2023-03-07 (×10): 10 mg
  Filled 2023-03-02 (×10): qty 2

## 2023-03-02 MED ORDER — HYDROMORPHONE HCL 1 MG/ML IJ SOLN
2.0000 mg | INTRAMUSCULAR | Status: DC | PRN
  Administered 2023-03-02 (×2): 4 mg via INTRAVENOUS
  Administered 2023-03-03: 2 mg via INTRAVENOUS
  Administered 2023-03-03 (×2): 4 mg via INTRAVENOUS
  Administered 2023-03-03: 2 mg via INTRAVENOUS
  Administered 2023-03-03 – 2023-03-04 (×7): 4 mg via INTRAVENOUS
  Administered 2023-03-04: 2 mg via INTRAVENOUS
  Administered 2023-03-05 (×7): 4 mg via INTRAVENOUS
  Administered 2023-03-06: 2 mg via INTRAVENOUS
  Administered 2023-03-06 – 2023-03-07 (×7): 4 mg via INTRAVENOUS
  Filled 2023-03-02 (×10): qty 4
  Filled 2023-03-02 (×2): qty 2
  Filled 2023-03-02 (×15): qty 4
  Filled 2023-03-02 (×2): qty 2
  Filled 2023-03-02: qty 4

## 2023-03-02 MED ORDER — MIDODRINE HCL 5 MG PO TABS: 10.0000 mg | ORAL_TABLET | Freq: Three times a day (TID) | ORAL | Status: DC

## 2023-03-02 MED ORDER — VANCOMYCIN HCL 1250 MG/250ML IV SOLN
1250.0000 mg | Freq: Once | INTRAVENOUS | Status: AC
  Administered 2023-03-02: 1250 mg via INTRAVENOUS
  Filled 2023-03-02: qty 250

## 2023-03-02 MED ORDER — FENTANYL 75 MCG/HR TD PT72
1.0000 | MEDICATED_PATCH | TRANSDERMAL | Status: DC
  Administered 2023-03-02 – 2023-03-05 (×2): 1 via TRANSDERMAL
  Filled 2023-03-02 (×2): qty 1

## 2023-03-02 MED ORDER — LACTATED RINGERS IV BOLUS
1000.0000 mL | Freq: Once | INTRAVENOUS | Status: AC
  Administered 2023-03-02: 1000 mL via INTRAVENOUS

## 2023-03-02 MED ORDER — METRONIDAZOLE 500 MG/100ML IV SOLN
500.0000 mg | Freq: Once | INTRAVENOUS | Status: AC
  Administered 2023-03-02: 500 mg via INTRAVENOUS
  Filled 2023-03-02: qty 100

## 2023-03-02 NOTE — Sepsis Progress Note (Signed)
Notified bedside nurse of need to draw repeat lactic acid. First lactic acid has not resulted.

## 2023-03-02 NOTE — Progress Notes (Addendum)
Pharmacy Antibiotic Note  Erik [name] is a 58 y.o. male admitted on 03/02/2023 with sepsis, possibly 2/2 pneumonia.  Pharmacy has been consulted for vancomycin and cefepine dosing. Patient has received cefepime 2g, flagyl 500mg , and vancomycin 1250mg  in the ED.  Plan: Vancomycin 750mg  IV every 12 hours (eAUC 435.5, Scr 0.8) Start cefepime 2g IV Q8H Continue flagyl 500mg  IV Q12H per MD F/u clinical progression, cultures, and LOT F/u MRSA nasal swab  Height: 5\' 9"  (175.3 cm) Weight: 52.8 kg (120 lb) IBW/kg (Calculated) : 70.7  Temp (24hrs), Avg:98.8 F (37.1 C), Min:98 F (36.7 C), Max:100.1 F (37.8 C)  Recent Labs  Lab 03/02/23 0612 03/02/23 1106  WBC 15.5*  --   CREATININE 0.63  --   LATICACIDVEN  --  2.4*    Estimated Creatinine Clearance: 77.4 mL/min (by C-G formula based on SCr of 0.63 mg/dL).    No Known Allergies  Antimicrobials this admission: Vancomycin/cefepime 1/31 >> Flagyl 1/31 x1  Microbiology results: 1/31 BCx: in process 1/31 RPP (-)  Thank you for allowing pharmacy to be a part of this patient's care.  Jenita Seashore 03/02/2023 3:18 PM

## 2023-03-02 NOTE — ED Notes (Signed)
Patient suctioned mucous plug removed per Riki Rusk RN

## 2023-03-02 NOTE — ED Provider Notes (Signed)
[place] EMERGENCY DEPARTMENT AT [facility] Provider Note   CSN: [id number] Arrival date & time: 03/02/23  0457     History  Chief Complaint  Patient presents with   Shortness of Breath    Erik [name] is a 58 y.o. male.  HPI     This is a 58 year old male who presents with shortness of breath and hypoxia.  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  Patient only communicates via whiteboard.  Recently admitted to [facility] with pneumonia.  He is on home oxygen at 6 L.  Level 5 caveat  Home Medications Prior to Admission medications   Medication Sig Start Date End Date Taking? Authorizing Provider  minocycline (MINOCIN) 100 MG capsule Take 100 mg by mouth 2 (two) times daily. 01/18/23  Yes [provider]  calcitRIOL (ROCALTROL) 0.5 MCG capsule Take 0.5 mcg by mouth 2 (two) times daily. 11/22/20   [provider]  levothyroxine (SYNTHROID) 112 MCG tablet Take 1 tablet by mouth daily. 11/23/20 02/21/21  [provider]  Nutritional Supplements (FEEDING SUPPLEMENT, OSMOLITE 1.5 CAL,) LIQD Give 7 cartons of Osmolite 1.5 daily via PEG split into 4 feedings.  Give 60 mL free water flushes before and after bolus feedings with an additional 16 ounces of water via PEG or mouth as tolerated.  This provides 2485 cal and 104.3 g of protein meeting 100% calorie and protein needs. Patient not taking: Reported on 01/14/2021 09/14/20   Lonie Peak, MD  Oxycodone HCl 10 MG TABS Take 10 mg by mouth every 4 (four) hours as needed.    [provider]  traMADol (ULTRAM) 50 MG tablet Take 1 tablet by mouth daily as needed. 01/06/21   [provider]      Allergies    Patient has no known allergies.    Review of Systems   Review of Systems  Constitutional:  Negative for fever.  Respiratory:  Positive  for cough and shortness of breath.   Cardiovascular:  Negative for chest pain.  All other systems reviewed and are negative.   Physical Exam Updated Vital Signs BP 110/78   Pulse (!) 108   Temp 100.1 F (37.8 C) (Oral)   Resp (!) 36   Ht 1.753 m (5\' 9" )   Wt 54.4 kg   SpO2 (!) 85%   BMI 17.72 kg/m  Physical Exam Vitals and nursing note reviewed.  Constitutional:      Comments: Chronically ill-appearing, thin, frail  HENT:     Head: Normocephalic and atraumatic.  Eyes:     Pupils: Pupils are equal, round, and reactive to light.  Neck:     Comments: Stoma with increased secretions Cardiovascular:     Rate and Rhythm: Regular rhythm. Tachycardia present.     Heart sounds: Normal heart sounds. No murmur heard. Pulmonary:     Effort: Pulmonary effort is normal. No respiratory distress.     Breath sounds: Rhonchi present.     Comments: Rhonchorous breath sounds left greater than right Abdominal:     General: Bowel sounds are normal.     Palpations: Abdomen is soft.     Tenderness: There is no abdominal tenderness. There is no rebound.     Comments: G-tube in place  Musculoskeletal:     Cervical back: Neck supple.  Lymphadenopathy:     Cervical: No cervical adenopathy.  Skin:  General: Skin is warm and dry.  Neurological:     Mental Status: He is alert and oriented to person, place, and time.  Psychiatric:        Mood and Affect: Mood normal.     ED Results / Procedures / Treatments   Labs (all labs ordered are listed, but only abnormal results are displayed) Labs Reviewed  CBC WITH DIFFERENTIAL/PLATELET - Abnormal; Notable for the following components:      Result Value   WBC 15.5 (*)    RBC 3.47 (*)    Hemoglobin 9.6 (*)    HCT 31.7 (*)    RDW 16.0 (*)    Platelets 570 (*)    Neutro Abs 14.9 (*)    Lymphs Abs 0.2 (*)    nRBC 1 (*)    All other components within normal limits  COMPREHENSIVE METABOLIC PANEL - Abnormal; Notable for the following  components:   CO2 33 (*)    BUN 27 (*)    Albumin 2.0 (*)    AST 139 (*)    ALT 151 (*)    Alkaline Phosphatase 263 (*)    All other components within normal limits  PROTIME-INR - Abnormal; Notable for the following components:   Prothrombin Time 17.9 (*)    INR 1.5 (*)    All other components within normal limits  APTT - Abnormal; Notable for the following components:   aPTT >200 (*)    All other components within normal limits  TROPONIN I (HIGH SENSITIVITY) - Abnormal; Notable for the following components:   Troponin I (High Sensitivity) 21 (*)    All other components within normal limits  RESP PANEL BY RT-PCR (RSV, FLU A&B, COVID)  RVPGX2  CULTURE, BLOOD (ROUTINE X 2)  CULTURE, BLOOD (ROUTINE X 2)  I-STAT CG4 LACTIC ACID, ED  I-STAT CG4 LACTIC ACID, ED  I-STAT ARTERIAL BLOOD GAS, ED  I-STAT CG4 LACTIC ACID, ED  I-STAT CG4 LACTIC ACID, ED  TROPONIN I (HIGH SENSITIVITY)    EKG EKG Interpretation Date/Time:  Friday March 02 2023 06:21:20 EST Ventricular Rate:  108 PR Interval:  112 QRS Duration:  84 QT Interval:  331 QTC Calculation: 444 R Axis:   83  Text Interpretation: Sinus tachycardia Baseline wander in lead(s) V6 Confirmed by Ross Marcus ([id number]) on 03/02/2023 6:38:17 AM  Radiology DG Chest Portable 1 View Result Date: 03/02/2023 CLINICAL DATA:  58 year old male with history of shortness of breath. EXAM: PORTABLE CHEST 1 VIEW COMPARISON:  Chest x-ray 08/18/2020. FINDINGS: Diffuse interstitial prominence, widespread peribronchial cuffing and extensive reticulonodular opacities are noted throughout the lungs bilaterally (right greater than left), corresponding to areas of tree-in-bud nodularity, areas of airspace consolidation and widespread bronchiectasis noted on recent chest CT 02/20/2023. The most confluent consolidation persists in the right lower lobe. Extensive fibro cavitary changes are also noted in the apex of the right upper lobe. Trace right pleural  effusion. No left pleural effusion. No definite pneumothorax. No evidence of pulmonary edema. Heart size is normal. Numerous surgical clips are noted at the level of the thoracic inlet from prior laryngectomy. IMPRESSION: 1. Extensive bronchiectasis, bronchiolitis, bronchitis, and areas of airspace consolidation throughout the lungs bilaterally (right greater than left), corresponding to findings of extensive atypical infection on recent chest CT 02/20/2023. 2. Trace right pleural effusion. Electronically Signed   By: Trudie Reed M.D.   On: 03/02/2023 06:18    Procedures .Critical Care  Performed by: Shon Baton, MD Authorized by: Shon Baton,  MD   Critical care provider statement:    Critical care time (minutes):  60   Critical care was necessary to treat or prevent imminent or life-threatening deterioration of the following conditions:  Sepsis and respiratory failure   Critical care was time spent personally by me on the following activities:  Development of treatment plan with patient or surrogate, discussions with consultants, evaluation of patient's response to treatment, examination of patient, ordering and review of laboratory studies, ordering and review of radiographic studies, ordering and performing treatments and interventions, pulse oximetry, re-evaluation of patient's condition and review of old charts     Medications Ordered in ED Medications  lactated ringers infusion (has no administration in time range)  lactated ringers bolus 1,000 mL (1,000 mLs Intravenous New Bag/Given 03/02/23 0631)    And  lactated ringers bolus 500 mL (has no administration in time range)    And  lactated ringers bolus 250 mL (has no administration in time range)  metroNIDAZOLE (FLAGYL) IVPB 500 mg (500 mg Intravenous New Bag/Given 03/02/23 0637)  vancomycin (VANCOREADY) IVPB 1250 mg/250 mL (1,250 mg Intravenous New Bag/Given 03/02/23 0707)  ipratropium-albuterol (DUONEB) 0.5-2.5 (3)  MG/3ML nebulizer solution 3 mL (has no administration in time range)  ceFEPIme (MAXIPIME) 2 g in sodium chloride 0.9 % 100 mL IVPB (0 g Intravenous Stopped 03/02/23 0700)    ED Course/ Medical Decision Making/ A&P                                 Medical Decision Making Amount and/or Complexity of Data Reviewed Labs: ordered. Radiology: ordered.  Risk Prescription drug management. Decision regarding hospitalization.   This patient presents to the ED for concern of hypoxia, shortness of breath, this involves an extensive number of treatment options, and is a complaint that carries with it a high risk of complications and morbidity.  I considered the following differential and admission for this acute, potentially life threatening condition.  The differential diagnosis includes pneumonia, COPD, recurrent aspiration, sepsis  MDM:    This is a 58 year old male with complex past medical history who presents with hypoxia and shortness of breath.  Status post laryngectomy.  Tracheal esophageal fistula.  Has known recurrent aspiration.  Recent hospitalization  Febrile to 100.1.  Tachycardic.  Hypoxic in the mid 70s on arrival.  He is acutely ill-appearing.  Sepsis workup initiated.  Chest x-ray is concerning for multifocal pneumonia.  White count 15.5.  Patient was given 30 cc/kg fluid.  He was given cefepime, vancomycin, and Flagyl for HCAP coverage and aspiration.  He was placed on a trach collar and was given a DuoNeb through trach collar.  Currently only has a stoma.  COVID and influenza are negative.  Patient does have some elevated LFTs which may be related to severe sepsis.  He does not appear to be in septic shock although his blood pressure is low 100s.  Discussed with intensive care given patient's tenuous respiratory status and poor airway.  They will assess for admission.  (Labs, imaging, consults)  Labs: I Ordered, and personally interpreted labs.  The pertinent results include: Sepsis  labs including CBC, CMP, PT/INR, COVID, influenza, blood cultures  Imaging Studies ordered: I ordered imaging studies including chest x-ray I independently visualized and interpreted imaging. I agree with the radiologist interpretation  Additional history obtained from chart review.  External records from outside source obtained and reviewed including [facility] notes  Cardiac  Monitoring: The patient was maintained on a cardiac monitor.  If on the cardiac monitor, I personally viewed and interpreted the cardiac monitored which showed an underlying rhythm of: Sinus tachycardia  Reevaluation: After the interventions noted above, I reevaluated the patient and found that they have :stayed the same  Social Determinants of Health:  lives with wife  Disposition: Admit  Co morbidities that complicate the patient evaluation  Past Medical History:  Diagnosis Date   Coronary artery disease    Dyspnea    GERD (gastroesophageal reflux disease)    [employer] 02/21/2020   Pneumonia    Ruptured lumbar intervertebral disc      Medicines Meds ordered this encounter  Medications   lactated ringers infusion   AND Linked Order Group    lactated ringers bolus 1,000 mL     Total Body Weight basis for 30 mL/kg  bolus delivery:   54.4 kg    lactated ringers bolus 500 mL     Total Body Weight basis for 30 mL/kg  bolus delivery:   54.4 kg    lactated ringers bolus 250 mL     Total Body Weight basis for 30 mL/kg  bolus delivery:   54.4 kg   DISCONTD: vancomycin (VANCOCIN) IVPB 1000 mg/200 mL premix    Indication::   Other Indication (list below)    Other Indication::   HCAP   ceFEPIme (MAXIPIME) 2 g in sodium chloride 0.9 % 100 mL IVPB    Antibiotic Indication::   HCAP   metroNIDAZOLE (FLAGYL) IVPB 500 mg    Antibiotic Indication::   Other Indication (list below)    Other Indication::   aspiration   vancomycin (VANCOREADY) IVPB 1250 mg/250 mL    Indication::   Other Indication (list below)     Other Indication::   HCAP   ipratropium-albuterol (DUONEB) 0.5-2.5 (3) MG/3ML nebulizer solution 3 mL    I have reviewed the patients home medicines and have made adjustments as needed  Problem List / ED Course: Problem List Items Addressed This Visit       Respiratory   Acute respiratory failure (HCC)   Other Visit Diagnoses       Severe sepsis (HCC)    -  Primary     Multifocal pneumonia       Relevant Medications   minocycline (MINOCIN) 100 MG capsule   ceFEPIme (MAXIPIME) 2 g in sodium chloride 0.9 % 100 mL IVPB (Completed)   metroNIDAZOLE (FLAGYL) IVPB 500 mg   vancomycin (VANCOREADY) IVPB 1250 mg/250 mL   ipratropium-albuterol (DUONEB) 0.5-2.5 (3) MG/3ML nebulizer solution 3 mL                   Final Clinical Impression(s) / ED Diagnoses Final diagnoses:  Severe sepsis (HCC)  Acute respiratory failure with hypoxia (HCC)  Multifocal pneumonia    Rx / DC Orders ED Discharge Orders     None         Shon Baton, MD 03/02/23 [phone]

## 2023-03-02 NOTE — Sepsis Progress Note (Signed)
Lactic acid has not resulted in EPIC yet. Shown as drawn at 0706 on chart review. Bedside RN is looking into getting result.

## 2023-03-02 NOTE — ED Notes (Signed)
Patient was cleaned by Tereeta  NT and  mepliex pads appled to bony areas. Dime size wound to sacral area.

## 2023-03-02 NOTE — H&P (Signed)
NAME:  Erik [name], MRN:  [id number], DOB:  [birth year], LOS: 0 ADMISSION DATE:  03/02/2023, CONSULTATION DATE:  03/02/2023 REFERRING MD:  Dr. 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  Of note patient recently admitted at [facility] for pneumonia.  He utilizes 6 L nasal cannula at baseline.  On ED arrival patient was seen hypoxic, slightly febrile, and mildly tachycardic.  Lab work on admission significant for CO2 33, elevated LFTs, at bedtime troponin 21, lactic 2.4, WBC 15.5, platelets 570, INR 1.5.  Code sepsis initiated on arrival patient received aggressive IV hydration with broad-spectrum antibiotics started.  Chest x-ray with extensive e bronchiectasis, bronchiolitis, bronchitis, and areas of airspace consolidation throughout the lungs bilaterally (right greater than left), consistent with extensive atypical Infection.  PCCM consulted for further management admission.  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 rate 17,  height 5\' 9"  (1.753 m), weight 54.4 kg, SpO2 93%.    FiO2 (%):  [40 %-70 %] 70 %  No intake or output data in the 24 hours ending 03/02/23 1447 Filed Weights   03/02/23 0514  Weight: 54.4 kg    Examination: Chronically ill appearing Laryngectomy looks fine some thick secretions coughed up Harsh rhonci bilaterally Nonlabored breathing pattern Moves to command, communicates by writing    Resolved Hospital Problem list     Assessment & Plan:  Acute on chronic hypoxic respiratory failure Multifocal pneumonia Laryngeal squamous cell carcinoma s/p radical neck resection with total laryngectomy Tracheostomy soft GL fistula with chronic aspiration- no great way to fix this, see [facility] DC summary from a week ago COPD not in flare Tobacco abuse  Severe sepsis Elevated LFTs Moderate protein calorie malnutrition Hypoalbuminemia Chronic pain  - RD consult for TF - HCAP coverage, check sputum culture - Check AM LFTs, consider RUQ [name] if continues to be abnormal - IV hydration - PTA opiates ordered - If decompensates please put in a ETT in stoma and connect to vent - TRH to assume care starting 03/02/22 as patient is stable on TC  Best Practice (right click and "Reselect all SmartList Selections" daily)   Diet/type:TF DVT prophylaxis lovenox Pressure ulcer(s): N/A GI prophylaxis: PPI Lines: N/A Foley:  N/A Code Status:  full code Last date of multidisciplinary goals of care discussion: PMT consulted, I did let him know I do not think there is a good way out of this  Labs   CBC: Recent Labs  Lab 03/02/23 0612  WBC 15.5*  NEUTROABS 14.9*  HGB 9.6*  HCT 31.7*  MCV 91.4  PLT 570*    Basic Metabolic Panel: Recent Labs  Lab 03/02/23 0612  NA 141  K 3.6  CL 99  CO2 33*  GLUCOSE 96  BUN 27*  CREATININE 0.63  CALCIUM 9.4   GFR: Estimated Creatinine Clearance: 77.4 mL/min (by C-G formula based on SCr of 0.63 mg/dL). Recent Labs  Lab 03/02/23 0612 03/02/23 1106   WBC 15.5*  --   LATICACIDVEN  --  2.4*    Liver Function Tests: Recent Labs  Lab 03/02/23 0612  AST 139*  ALT 151*  ALKPHOS 263*  BILITOT 0.9  PROT 6.6  ALBUMIN 2.0*   No results for input(s): "LIPASE", "AMYLASE" in the last 168 hours. No results for input(s): "AMMONIA" in the last 168 hours.  ABG No results found for: "PHART", "PCO2ART", "PO2ART", "HCO3", "TCO2", "ACIDBASEDEF", "O2SAT"   Coagulation Profile: Recent Labs  Lab 03/02/23 0612  INR 1.5*    Cardiac Enzymes: No results for input(s): "CKTOTAL", "CKMB", "CKMBINDEX", "TROPONINI" in the last 168 hours.  HbA1C: No results found for: "HGBA1C"  CBG: No results for input(s): "GLUCAP" in the last 168 hours.  Review of Systems:    Positive Symptoms in bold:  Constitutional fevers, chills, weight loss, fatigue, anorexia, malaise  Eyes decreased vision, double vision, eye irritation  Ears, Nose, Mouth, Throat sore throat, trouble swallowing, sinus congestion  Cardiovascular chest pain, paroxysmal nocturnal dyspnea, lower ext edema, palpitations   Respiratory SOB, cough, DOE, hemoptysis, wheezing  Gastrointestinal nausea, vomiting, diarrhea  Genitourinary burning with urination, trouble urinating  Musculoskeletal joint aches, joint swelling, back pain  Integumentary  rashes, skin lesions  Neurological focal weakness, focal numbness, trouble speaking, headaches  Psychiatric depression, anxiety, confusion  Endocrine polyuria, polydipsia, cold intolerance, heat intolerance  Hematologic abnormal bruising, abnormal bleeding, unexplained nose bleeds  Allergic/Immunologic recurrent infections, hives, swollen lymph nodes     Past Medical History:  He,  has a past medical history of Coronary artery disease, Dyspnea, GERD (gastroesophageal reflux disease), Pneumonia, and Ruptured lumbar intervertebral disc.   Surgical History:   Past Surgical History:  Procedure Laterality Date   CARDIAC CATHETERIZATION      with stent placement   IR GASTROSTOMY TUBE MOD SED  04/20/2020   IR GASTROSTOMY TUBE REMOVAL  01/26/2021   IR IMAGING GUIDED PORT INSERTION  04/20/2020   IR REMOVAL TUN ACCESS W/ PORT W/O FL MOD SED  04/21/2021   TONSILLECTOMY     TRACHEOSTOMY TUBE PLACEMENT N/A 03/05/2020   Procedure: AWAKE TRACHEOSTOMY, DIRECT LARYNGOSCOPY WITH BIOPSY;  Surgeon: Christia Reading, MD;  Location: [facility]S;  Service: ENT;  Laterality: N/A;     Social History:   reports that he quit smoking about 2 years ago. His smoking use included cigarettes. He has quit using smokeless tobacco.  His smokeless tobacco use included chew. He reports current drug use. Drug: Marijuana. He reports that he does not drink alcohol.   Family History:  His family history is not on file.   Allergies No Known Allergies   Home Medications  Prior to Admission medications   Medication Sig Start Date End Date Taking? Authorizing Provider  calcitRIOL (ROCALTROL) 0.5 MCG capsule Take 0.5 mcg by mouth 2 (two) times daily. 11/22/20  Yes [provider]  fentaNYL (DURAGESIC) 75 MCG/HR Place 1 patch onto the skin every 3 (three) days.   Yes [provider]  HYDROmorphone (DILAUDID) 8 MG tablet Take 8 mg by mouth every 4 (four) hours as needed for severe pain (pain score 7-10) (feeding tube).   Yes [provider]  levothyroxine (SYNTHROID) 112 MCG tablet Take 1 tablet by mouth  daily. 11/23/20 03/02/23 Yes [provider]  minocycline (MINOCIN) 100 MG capsule Take 100 mg by mouth 2 (two) times daily. 01/18/23  Yes [provider]  Nutritional Supplements (FEEDING SUPPLEMENT, OSMOLITE 1.5 CAL,) LIQD Give 7 cartons of Osmolite 1.5 daily via PEG split into 4 feedings.  Give 60 mL free water flushes before and after bolus feedings with an additional 16 ounces of water via PEG or mouth as tolerated.  This provides 2485 cal and 104.3 g of protein meeting 100% calorie and protein needs. 09/14/20  Yes Lonie Peak, MD   Oxycodone HCl 10 MG TABS Take 10 mg by mouth every 4 (four) hours as needed. Patient not taking: Reported on 03/02/2023    [provider]  traMADol (ULTRAM) 50 MG tablet Take 1 tablet by mouth daily as needed. Patient not taking: Reported on 03/02/2023 01/06/21   [provider]     Critical care time:   Myrla Halsted MD PCCM [facility] Personal contact information can be found on Amion  If no contact or response made please call [phone] 03/02/2023, 3:07 PM

## 2023-03-02 NOTE — ED Triage Notes (Addendum)
Arrives [facility] from home.   Pt woke up and asked wife for a pain pill but she reported something was very wrong.   Recently admitted to [facility] for pneumonia. Paramedics say breathing sounded different on the left side.   Usually wears trach collar ~6L but is maintaining sats in the mid to upper 70's with 15L NRB to tracheostomy.   Hx of Laryngeal / esophageal cancer and receiving immunotherapy at [facility].   Communicates with whiteboard. Fentanyl patch on left arm. 125mg  Solumedrol admin by paramedics.

## 2023-03-02 NOTE — Sepsis Progress Note (Signed)
 Elink monitoring for the code sepsis protocol.

## 2023-03-02 NOTE — Progress Notes (Signed)
Placed order for 1 L of LR with his elevated  lactate

## 2023-03-02 NOTE — Consult Note (Signed)
Palliative Care Consult Note                                  Date: 03/02/2023   Patient Name: Erik [name]  DOB: [birth year]  MRN: [id number]  Age / Sex: 58 y.o., male  PCP: Rema Fendt, NP Referring Physician: Lorin Glass, MD  Reason for Consultation: Establishing goals of care  HPI/Patient Profile: 58 y.o. 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   Past Medical History:  Diagnosis Date   Coronary artery disease    Dyspnea    GERD (gastroesophageal reflux disease)    [employer] 02/21/2020   Pneumonia    Ruptured lumbar intervertebral disc     Subjective:   I have reviewed medical records including EPIC notes, labs and imaging, assessed the patient and then met in the patient's room with the patient and his wife Erik [name] to discuss diagnosis prognosis, GOC, EOL wishes, disposition and options.  I introduced Palliative Medicine as specialized medical care for people living with serious illness. It focuses on providing relief from symptoms and stress of a serious illness. The goal is to improve quality of life for both the patient and the family.  Today's Discussion: Patient and his wife share their understanding of his chronic conditions and acute illness. The patient shares he is treated at [facility] for his laryngeal cancer. He shares that he had surgery which initially did well but now he has a tracheal esophageal fistula. We discuss his known aspiration and recent hospitalization for pneumonia. I reviewed my understanding of his health, including limitations of ongoing interventions and high risk for further decline despite aggressive treatment efforts. We discuss that he will likely  continue to have aspiration with increased risk for infections. We discussed his weight loss, decreased functional status, and overall failure to thrive.   Patient is married. He has two children and his spouse has two children. They have nine grandchildren. One of their children lives in their small mobile home with her two young children. 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 The patient's wife would be his proxy decision maker if he were unable to make decisions.  A discussion was had today regarding advanced directives. We discussed code status. Recommended consideration of DNR status, understanding evidenced-based poor outcomes in similar hospitalized patients, as the cause of the arrest is likely associated with chronic/terminal disease rather than a reversible acute cardio-pulmonary event. At this time patient would like to remain full code. We then discussed scopes of care and the difference between a aggressive medical intervention path and a palliative comfort care path for this patient. Patient asked more about what a comfort path would look like. Discussed that with a comfort focused path the patient would no longer receive aggressive medical interventions such as continuous vital signs, lab work, radiology testing, or medications not focused on comfort. All care would focus on how the patient is looking and feeling. This would include management of any symptoms that may cause discomfort, pain, shortness of breath, cough, nausea, agitation, anxiety, and/or secretions etc. Symptoms would be managed with medications and other non-pharmacological interventions. Patient and his wife would like more time to consider these options. They would like to talk  to their children as well. Patient remains full scope of care. Patient's wife shares she wants to spend as much time as she can with him but does not want him to suffer.  Emphasized the importance of considering his quality of life, overall  suffering, what would be acceptable to him in the future when making these decisions.  Discussed the importance of continued conversation with family and the medical providers regarding overall plan of care and treatment options, ensuring decisions are within the context of the patient's values and GOCs.  Questions and concerns were addressed. Hard Choices booklet left for review. The family was encouraged to call with questions or concerns. PMT will continue to support holistically.  Review of Systems  Respiratory:  Positive for shortness of breath.     Objective:   Primary Diagnoses: Present on Admission:  Acute on chronic respiratory failure [facility])   Physical Exam Vitals reviewed.  Constitutional:      General: He is not in acute distress.    Appearance: He is cachectic. He is ill-appearing.  Cardiovascular:     Rate and Rhythm: Normal rate.  Pulmonary:     Effort: Tachypnea present.  Skin:    General: Skin is warm and dry.  Neurological:     Mental Status: He is alert and oriented to person, place, and time.  Psychiatric:        Behavior: Behavior normal.     Vital Signs:  BP 95/65   Pulse 99   Temp 98 F (36.7 C) (Oral)   Resp (!) 26   Ht 5\' 9"  (1.753 m)   Wt 54.4 kg   SpO2 90%   BMI 17.72 kg/m    Advanced Care Planning:   Existing Vynca/ACP Documentation: None.  Primary Decision Maker: PATIENT  Code Status/Advance Care Planning: Full code  Assessment & Plan:   SUMMARY OF RECOMMENDATIONS   Full code Full scope Encouraged patient and his family to continue discussions re: goals of care PMT to support  Discussed with: bedside RN and Dr. Katrinka Blazing  Time Total: 75 minutes  Thank you for allowing [name] to participate in the care of Hamed Debella PMT will continue to support holistically.  Signed by: Sarina Ser, NP Palliative Medicine Team  Team Phone # [phone] (Nights/Weekends)  03/02/2023, 1:54 PM

## 2023-03-03 LAB — CBG MONITORING, ED
Glucose-Capillary: 86 mg/dL (ref 70–99)
Glucose-Capillary: 80 mg/dL (ref 70–99)

## 2023-03-03 LAB — GLUCOSE, CAPILLARY
Glucose-Capillary: 101 mg/dL — ABNORMAL HIGH (ref 70–99)
Glucose-Capillary: 84 mg/dL (ref 70–99)
Glucose-Capillary: 97 mg/dL (ref 70–99)

## 2023-03-03 LAB — HEPATIC FUNCTION PANEL
ALT: 77 U/L — ABNORMAL HIGH (ref 0–44)
AST: 30 U/L (ref 15–41)
Albumin: 1.6 g/dL — ABNORMAL LOW (ref 3.5–5.0)
Alkaline Phosphatase: 173 U/L — ABNORMAL HIGH (ref 38–126)
Bilirubin, Direct: 0.2 mg/dL (ref 0.0–0.2)
Indirect Bilirubin: 0.6 mg/dL (ref 0.3–0.9)
Total Bilirubin: 0.8 mg/dL (ref 0.0–1.2)
Total Protein: 5.8 g/dL — ABNORMAL LOW (ref 6.5–8.1)

## 2023-03-03 LAB — PHOSPHORUS: Phosphorus: 5.8 mg/dL — ABNORMAL HIGH (ref 2.5–4.6)

## 2023-03-03 LAB — MRSA NEXT GEN BY PCR, NASAL: MRSA by PCR Next Gen: NOT DETECTED

## 2023-03-03 LAB — LACTIC ACID, PLASMA: Lactic Acid, Venous: 1.2 mmol/L (ref 0.5–1.9)

## 2023-03-03 LAB — MAGNESIUM: Magnesium: 1.8 mg/dL (ref 1.7–2.4)

## 2023-03-03 MED ORDER — ORAL CARE MOUTH RINSE: 15.0000 mL | OROMUCOSAL | Status: DC | PRN

## 2023-03-03 MED ORDER — ORAL CARE MOUTH RINSE
15.0000 mL | OROMUCOSAL | Status: DC
  Administered 2023-03-03 – 2023-03-07 (×15): 15 mL via OROMUCOSAL

## 2023-03-03 MED ORDER — LEVOTHYROXINE SODIUM 112 MCG PO TABS
112.0000 ug | ORAL_TABLET | Freq: Every day | ORAL | Status: DC
  Filled 2023-03-03: qty 1

## 2023-03-03 NOTE — Progress Notes (Signed)
RT note. RT at patient bedside during rounds. ATC found on 6 L but the fio2 not dialed in correctly. Patient found to be sating in the 80's with a good wave form, lowest sat 83. Patient now on 12L -100% ATC sat 93%. RT will continue to monitor.    03/03/23 1129  Therapy Vitals  Pulse Rate 91  Resp (!) 36  MEWS Score/Color  MEWS Score 3  MEWS Score Color Yellow  Respiratory Assessment  Assessment Type Assess only  Respiratory Pattern Regular;Unlabored  Chest Assessment Chest expansion symmetrical  Cough Productive;Strong  Sputum Amount Copious  Sputum Color Yellow  Sputum Consistency Thick;Tenacious  Sputum Specimen Source Other (Comment) (stoma)  Bilateral Breath Sounds Rhonchi;Diminished  Oxygen Therapy/Pulse Ox  O2 Device Tracheostomy Collar  O2 Therapy Oxygen humidified  O2 Flow Rate (L/min) (S)  12 L/min  FiO2 (%) (S)  100 %  SpO2 93 %  Laryngectomy  Placement Date/Time: 03/02/23 0539    Site Assessment (Peristomal Skin) Clean;Dry  Site Care (Peristomal Skin) Open to air  Laryngectomy Tube/Button Care No laryngectomy tube/button  Laryngectomy Safety Checklist "Total laryngectomy/neck breather" sign at head of bed;Adult bag-valve-mask (Ambu-bag) with pediatric mask at bedside;Suction set up at bedside;Communication board available to patient  Respiratory  Airway LDA Stoma

## 2023-03-03 NOTE — Progress Notes (Signed)
Daily Progress Note   Patient Name: Erik [name]       Date: 03/03/2023 DOB: 09-18-1965  Age: 58 y.o. MRN#: [id number] Attending Physician: Hughie Closs, MD Primary Care Physician: Rema Fendt, NP Admit Date: 03/02/2023  Reason for Consultation/Follow-up: Establishing goals of care  Length of Stay: 1  Current Medications: Scheduled Meds:   calcitRIOL  0.5 mcg Per Tube BID   enoxaparin (LOVENOX) injection  40 mg Subcutaneous Q24H   feeding supplement (PIVOT 1.5 CAL)  1,000 mL Per Tube Q24H   feeding supplement (PROSource TF20)  60 mL Per Tube Daily   fentaNYL  1 patch Transdermal Q72H   levothyroxine  112 mcg Oral Daily   levothyroxine  250 mcg Per J Tube Q0600   midodrine  10 mg Per Tube TID WC   pantoprazole (PROTONIX) IV  40 mg Intravenous Q12H   thiamine  100 mg Per Tube Daily    Continuous Infusions:  ceFEPime (MAXIPIME) IV Stopped (03/03/23 0909)   metronidazole Stopped (03/03/23 0505)   vancomycin Stopped (03/03/23 0827)    PRN Meds: acetaminophen **OR** acetaminophen, HYDROmorphone (DILAUDID) injection  Physical Exam Vitals reviewed.  Constitutional:      General: He is not in acute distress.    Appearance: He is ill-appearing.  Cardiovascular:     Rate and Rhythm: Normal rate.  Pulmonary:     Effort: Pulmonary effort is normal.  Skin:    General: Skin is warm and dry.  Neurological:     Mental Status: He is alert and oriented to person, place, and time.  Psychiatric:        Behavior: Behavior normal.             Vital Signs: BP 110/77 (BP Location: Right Arm)   Pulse 81   Temp 97.8 F (36.6 C) (Oral)   Resp 17   Ht 5\' 9"  (1.753 m)   Wt 54.4 kg   SpO2 97%   BMI 17.72 kg/m  SpO2: SpO2: 97 % O2 Device: O2 Device: Tracheostomy Collar O2 Flow  Rate: O2 Flow Rate (L/min): 10 L/min    Patient Active Problem List   Diagnosis Date Noted   Acute on chronic respiratory failure (HCC) 03/02/2023   Protein-calorie malnutrition, severe 08/21/2020   Acute respiratory failure (HCC) 08/15/2020   Sepsis (HCC) 08/14/2020  CAP (community acquired pneumonia) 08/14/2020   Port-A-Cath in place 08/03/2020   Mucositis due to antineoplastic therapy 05/06/2020   Chemotherapy-induced nausea 04/28/2020   Weight loss, unintentional 04/28/2020   Dysgeusia 04/28/2020   G tube feedings (HCC) 04/28/2020   Nicotine abuse 04/21/2020   Head and neck cancer (HCC)    History of tracheostomy 03/16/2020   Malnutrition of moderate degree 03/05/2020   Laryngeal cancer (HCC) 03/04/2020    Palliative Care Assessment & Plan   Patient Profile: 58 y.o. 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   Patient faces treatment option decisions, advanced directive decisions and anticipatory care needs.  Today's Discussion: Patient sleeping but quickly arose when calling his name. No family is at bedside. His breathing looks less labored than yesterday. Patient uses pen and paper to communicate. He shares that he and his family did not have a chance to discuss goals of care last night. He would also like his wife present during goals of care discussions. He asks for time to be moved to a room and discuss goals with his family before continuing our conversations.   Encouraged patient/family to call PMT if they have questions or concerns or are ready to discuss goals of care further.  Recommendations/Plan: Full code Full scope Encouraged patient and his family to continue discussions re: goals of care Time for family to  discuss goals of care and options PMT to support   Code Status:    Code Status Orders  (From admission, onward)           Start     Ordered   03/02/23 0805  Full code  Continuous       Question:  By:  Answer:  Consent: discussion documented in EHR   03/02/23 0805         Extensive chart review has been completed prior to seeing the patient including labs, vital signs, imaging, progress/consult notes, orders, medications, and available advance directive documents.  Care plan was discussed with bedside RN and Dr. Jacqulyn Bath  Time spent: 45 minutes  Thank you for allowing the Palliative Medicine Team to assist in the care of this patient.   Sherryll Burger, NP  Please contact Palliative Medicine Team phone at [phone] for questions and concerns.

## 2023-03-03 NOTE — Progress Notes (Addendum)
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  Of note patient recently admitted at [facility] for pneumonia.  He utilizes 6 L nasal cannula at baseline.  On ED arrival patient was seen hypoxic, slightly febrile, and mildly tachycardic.  Lab work on admission significant for CO2 33, elevated LFTs, at bedtime troponin 21, lactic 2.4, WBC 15.5, platelets 570, INR 1.5.  Code sepsis initiated on arrival patient received aggressive IV hydration with broad-spectrum antibiotics started.  Chest x-ray with extensive e bronchiectasis, bronchiolitis, bronchitis, and areas of airspace consolidation throughout the lungs bilaterally (right greater than left), consistent with extensive atypical Infection.  PCCM consulted for further management admission.  Patient was transferred under TRH to 125.  Assessment & Plan:   Principal Problem:   Acute on chronic respiratory failure (HCC)  Patient with history of Laryngeal squamous cell carcinoma s/p radical neck resection with total laryngectomy with severe sepsis and acute on chronic hypoxic respiratory failure secondary to multifocal and healthcare associated pneumonia, POA: At baseline requires 6 L of oxygen, upon presentation hypoxic at that level and required 15 L of oxygen.  Admitted under PCCM, started on cefepime, Flagyl and vancomycin.  Severe sepsis criteria based on tachycardia, tachypnea, leukocytosis, acute hypoxic respiratory failure and lactic acid of 5.2.  Looks like patient was hydrated with 1 L of IV fluid bolus.  I will  repeat lactic acid today and CBC today.  Continue to wean oxygen.  COPD: Stable.  Elevated LFTs: Likely in the setting of sepsis.  LFT improving.  Moderate protein calorie malnutrition/hypoalbuminemia: RD consulted for tube feeding.  Chronic pain: PT opiates ordered.  Hypomagnesemia: Resolved.  History of hypotension: Continue midodrine PTA dose.  Acquired hypothyroidism: Resume Synthroid.  Tobacco abuse: Unsure if he is still smoking.  GOC: Palliative care following, per them, patient feels overwhelmed at the moment and not ready to have further GOC discussions.   DVT prophylaxis: enoxaparin (LOVENOX) injection 40 mg Start: 03/03/23 0815 SCDs Start: 03/02/23 0804   Code Status: Full Code  Family Communication:  None present at bedside.  Plan of care discussed with patient in length and he/she verbalized understanding and agreed with it.  Status is: Inpatient Remains inpatient appropriate because: Requiring higher amount of oxygen and still septic.   Estimated body mass index is 17.72 kg/m as calculated from the following:   Height as of this encounter: 5\' 9"  (1.753 m).   Weight as of this encounter: 54.4 kg.    Nutritional Assessment: Body mass index is 17.72 kg/m.Marland Kitchen Seen by dietician.  I agree with the assessment and plan as outlined below: Nutrition Status:        . Skin Assessment: I have examined the patient's skin and I agree with the wound assessment as performed by the wound care RN as outlined below:    Consultants:  Palliative care  Procedures:  None  Antimicrobials:  Anti-infectives (From admission, onward)    Start     Dose/Rate Route Frequency Ordered Stop   03/02/23 1900  vancomycin (VANCOREADY) IVPB 750 mg/150 mL  Status:  Discontinued  750 mg 150 mL/hr over 60 Minutes Intravenous Every 12 hours 03/02/23 1525 03/02/23 1536   03/02/23 1900  vancomycin (VANCOCIN) 750 mg in sodium chloride 0.9 % 250 mL IVPB        750 mg 250 mL/hr over  60 Minutes Intravenous Every 12 hours 03/02/23 1536     03/02/23 1530  ceFEPIme (MAXIPIME) 2 g in sodium chloride 0.9 % 100 mL IVPB        2 g 200 mL/hr over 30 Minutes Intravenous Every 8 hours 03/02/23 1525     03/02/23 1515  metroNIDAZOLE (FLAGYL) IVPB 500 mg        500 mg 100 mL/hr over 60 Minutes Intravenous Every 12 hours 03/02/23 1504     03/02/23 0630  vancomycin (VANCOCIN) IVPB 1000 mg/200 mL premix  Status:  Discontinued        1,000 mg 200 mL/hr over 60 Minutes Intravenous  Once 03/02/23 0619 03/02/23 0626   03/02/23 0630  ceFEPIme (MAXIPIME) 2 g in sodium chloride 0.9 % 100 mL IVPB        2 g 200 mL/hr over 30 Minutes Intravenous  Once 03/02/23 0619 03/02/23 0700   03/02/23 0630  metroNIDAZOLE (FLAGYL) IVPB 500 mg        500 mg 100 mL/hr over 60 Minutes Intravenous  Once 03/02/23 0619 03/02/23 0734   03/02/23 0630  vancomycin (VANCOREADY) IVPB 1250 mg/250 mL        1,250 mg 166.7 mL/hr over 90 Minutes Intravenous  Once 03/02/23 4401 03/02/23 0820         Subjective: Patient seen and examined, nonverbal.  He appears to be much better.  Nurse at the bedside.  Patient had no complaint.  Objective: Vitals:   03/03/23 0345 03/03/23 0421 03/03/23 0735 03/03/23 0751  BP: 114/88   110/77  Pulse: 78  (!) 101 81  Resp: (!) 28  19 17   Temp: 98.3 F (36.8 C)   97.8 F (36.6 C)  TempSrc: Oral   Oral  SpO2: 100% 93% 94% 97%  Weight:      Height:        Intake/Output Summary (Last 24 hours) at 03/03/2023 0912 Last data filed at 03/03/2023 0272 Gross per 24 hour  Intake 4229.11 ml  Output --  Net 4229.11 ml   Filed Weights   03/02/23 0514  Weight: 54.4 kg    Examination:  General exam: Appears calm and comfortable  Respiratory system: Diminished breath sounds with faint rhonchi bilaterally. Respiratory effort normal. Cardiovascular system: S1 & S2 heard, RRR. No JVD, murmurs, rubs, gallops or clicks. No pedal edema. Gastrointestinal system: Abdomen is nondistended,  soft and nontender. No organomegaly or masses felt. Normal bowel sounds heard. Central nervous system: Alert and oriented. No focal neurological deficits. Extremities: Symmetric 5 x 5 power. Skin: No rashes, lesions or ulcers Psychiatry: Judgement and insight appear normal. Mood & affect appropriate.    Data Reviewed: I have personally reviewed following labs and imaging studies  CBC: Recent Labs  Lab 03/02/23 0612  WBC 15.5*  NEUTROABS 14.9*  HGB 9.6*  HCT 31.7*  MCV 91.4  PLT 570*   Basic Metabolic Panel: Recent Labs  Lab 03/02/23 0612 03/02/23 1916 03/03/23 0651  NA 141  --   --   K 3.6  --   --   CL 99  --   --   CO2 33*  --   --   GLUCOSE 96  --   --  BUN 27*  --   --   CREATININE 0.63  --   --   CALCIUM 9.4  --   --   MG  --  1.6* 1.8  PHOS  --  4.3 5.8*   GFR: Estimated Creatinine Clearance: 77.4 mL/min (by C-G formula based on SCr of 0.63 mg/dL). Liver Function Tests: Recent Labs  Lab 03/02/23 0612 03/03/23 0651  AST 139* 30  ALT 151* 77*  ALKPHOS 263* 173*  BILITOT 0.9 0.8  PROT 6.6 5.8*  ALBUMIN 2.0* 1.6*   No results for input(s): "LIPASE", "AMYLASE" in the last 168 hours. No results for input(s): "AMMONIA" in the last 168 hours. Coagulation Profile: Recent Labs  Lab 03/02/23 0612  INR 1.5*   Cardiac Enzymes: No results for input(s): "CKTOTAL", "CKMB", "CKMBINDEX", "TROPONINI" in the last 168 hours. BNP (last 3 results) No results for input(s): "PROBNP" in the last 8760 hours. HbA1C: No results for input(s): "HGBA1C" in the last 72 hours. CBG: Recent Labs  Lab 03/03/23 0724  GLUCAP 86   Lipid Profile: No results for input(s): "CHOL", "HDL", "LDLCALC", "TRIG", "CHOLHDL", "LDLDIRECT" in the last 72 hours. Thyroid Function Tests: No results for input(s): "TSH", "T4TOTAL", "FREET4", "T3FREE", "THYROIDAB" in the last 72 hours. Anemia Panel: No results for input(s): "VITAMINB12", "FOLATE", "FERRITIN", "TIBC", "IRON", "RETICCTPCT" in  the last 72 hours. Sepsis Labs: Recent Labs  Lab 03/02/23 1106 03/02/23 1916  LATICACIDVEN 2.4* 5.2*    Recent Results (from the past 240 hours)  Resp panel by RT-PCR (RSV, Flu A&B, Covid) Anterior Nasal Swab     Status: None   Collection Time: 03/02/23  5:36 AM   Specimen: Anterior Nasal Swab  Result Value Ref Range Status   SARS Coronavirus 2 by RT PCR NEGATIVE NEGATIVE Final   Influenza A by PCR NEGATIVE NEGATIVE Final   Influenza B by PCR NEGATIVE NEGATIVE Final    Comment: (NOTE) The Xpert Xpress SARS-CoV-2/FLU/RSV plus assay is intended as an aid in the diagnosis of influenza from Nasopharyngeal swab specimens and should not be used as a sole basis for treatment. Nasal washings and aspirates are unacceptable for Xpert Xpress SARS-CoV-2/FLU/RSV testing.  Fact Sheet for Patients: BloggerCourse.com  Fact Sheet for Healthcare Providers: SeriousBroker.it  This test is not yet approved or cleared by the [place] FDA and has been authorized for detection and/or diagnosis of SARS-CoV-2 by FDA under an Emergency Use Authorization (EUA). This EUA will remain in effect (meaning this test can be used) for the duration of the COVID-19 declaration under Section 564(b)(1) of the Act, 21 U.S.C. section 360bbb-3(b)(1), unless the authorization is terminated or revoked.     Resp Syncytial Virus by PCR NEGATIVE NEGATIVE Final    Comment: (NOTE) Fact Sheet for Patients: BloggerCourse.com  Fact Sheet for Healthcare Providers: SeriousBroker.it  This test is not yet approved or cleared by the [place] FDA and has been authorized for detection and/or diagnosis of SARS-CoV-2 by FDA under an Emergency Use Authorization (EUA). This EUA will remain in effect (meaning this test can be used) for the duration of the COVID-19 declaration under Section 564(b)(1) of the Act, 21  U.S.C. section 360bbb-3(b)(1), unless the authorization is terminated or revoked.  Performed at [facility], [address] [place], [place] [address]   Blood Culture (routine x 2)     Status: None (Preliminary result)   Collection Time: 03/02/23  6:09 AM   Specimen: BLOOD  Result Value Ref Range Status   Specimen Description  BLOOD RIGHT ANTECUBITAL  Final   Special Requests   Final    BOTTLES DRAWN AEROBIC AND ANAEROBIC Blood Culture adequate volume   Culture   Final    NO GROWTH 1 DAY Performed at [facility], [address], [place], [place] [address]    Report Status PENDING  Incomplete  Blood Culture (routine x 2)     Status: None (Preliminary result)   Collection Time: 03/02/23  6:12 AM   Specimen: BLOOD RIGHT HAND  Result Value Ref Range Status   Specimen Description BLOOD RIGHT HAND  Final   Special Requests   Final    BOTTLES DRAWN AEROBIC ONLY Blood Culture adequate volume   Culture   Final    NO GROWTH 1 DAY Performed at [facility], [address], [place], [place] [address]    Report Status PENDING  Incomplete     Radiology Studies: DG Chest Portable 1 View Result Date: 03/02/2023 CLINICAL DATA:  58 year old male with history of shortness of breath. EXAM: PORTABLE CHEST 1 VIEW COMPARISON:  Chest x-ray 08/18/2020. FINDINGS: Diffuse interstitial prominence, widespread peribronchial cuffing and extensive reticulonodular opacities are noted throughout the lungs bilaterally (right greater than left), corresponding to areas of tree-in-bud nodularity, areas of airspace consolidation and widespread bronchiectasis noted on recent chest CT 02/20/2023. The most confluent consolidation persists in the right lower lobe. Extensive fibro cavitary changes are also noted in the apex of the right upper lobe. Trace right pleural effusion. No left pleural effusion. No definite pneumothorax. No evidence of pulmonary edema. Heart size is normal. Numerous surgical clips  are noted at the level of the thoracic inlet from prior laryngectomy. IMPRESSION: 1. Extensive bronchiectasis, bronchiolitis, bronchitis, and areas of airspace consolidation throughout the lungs bilaterally (right greater than left), corresponding to findings of extensive atypical infection on recent chest CT 02/20/2023. 2. Trace right pleural effusion. Electronically Signed   By: Trudie Reed M.D.   On: 03/02/2023 06:18    Scheduled Meds:  calcitRIOL  0.5 mcg Per Tube BID   enoxaparin (LOVENOX) injection  40 mg Subcutaneous Q24H   feeding supplement (PIVOT 1.5 CAL)  1,000 mL Per Tube Q24H   feeding supplement (PROSource TF20)  60 mL Per Tube Daily   fentaNYL  1 patch Transdermal Q72H   levothyroxine  250 mcg Per J Tube Q0600   midodrine  10 mg Per Tube TID WC   pantoprazole (PROTONIX) IV  40 mg Intravenous Q12H   thiamine  100 mg Per Tube Daily   Continuous Infusions:  ceFEPime (MAXIPIME) IV Stopped (03/03/23 0909)   metronidazole Stopped (03/03/23 0505)   vancomycin Stopped (03/03/23 0827)     LOS: 1 day   Hughie Closs, MD [facility]  03/03/2023, 9:12 AM   *Please note that this is a verbal dictation therefore any spelling or grammatical errors are due to the "Dragon Medical One" system interpretation.  Please page via Amion and do not message via secure chat for urgent patient care matters. Secure chat can be used for non urgent patient care matters.  How to contact the [facility] Attending or Consulting provider 7A - 7P or covering provider during after hours 7P -7A, for this patient?  Check the care team in [facility] and look for a) attending/consulting TR[facility] provider listed and b) the [facility] team listed. Page or secure chat 7A-7P. Log into [email or web address] and use Max's universal password to access. If you do not have the password, please contact the hospital  operator. Locate the [facility] provider you are looking for under [facility] and page to a number that you can be  directly reached. If you still have difficulty reaching the provider, please page the [facility] (Director on Call) for the Hospitalists listed on amion for assistance.

## 2023-03-03 NOTE — Progress Notes (Signed)
RT note. Patient transported from ED to 5W on venti mask with no complications.

## 2023-03-04 DIAGNOSIS — J9601 Acute respiratory failure with hypoxia: Secondary | ICD-10-CM

## 2023-03-04 DIAGNOSIS — R6520 Severe sepsis without septic shock: Secondary | ICD-10-CM

## 2023-03-04 LAB — GLUCOSE, CAPILLARY
Glucose-Capillary: 75 mg/dL (ref 70–99)
Glucose-Capillary: 88 mg/dL (ref 70–99)
Glucose-Capillary: 89 mg/dL (ref 70–99)
Glucose-Capillary: 96 mg/dL (ref 70–99)
Glucose-Capillary: 98 mg/dL (ref 70–99)
Glucose-Capillary: 99 mg/dL (ref 70–99)

## 2023-03-04 LAB — COMPREHENSIVE METABOLIC PANEL
ALT: 57 U/L — ABNORMAL HIGH (ref 0–44)
AST: 21 U/L (ref 15–41)
Albumin: 1.7 g/dL — ABNORMAL LOW (ref 3.5–5.0)
Alkaline Phosphatase: 142 U/L — ABNORMAL HIGH (ref 38–126)
Anion gap: 6 (ref 5–15)
BUN: 33 mg/dL — ABNORMAL HIGH (ref 6–20)
CO2: 33 mmol/L — ABNORMAL HIGH (ref 22–32)
Calcium: 9.4 mg/dL (ref 8.9–10.3)
Chloride: 103 mmol/L (ref 98–111)
Creatinine, Ser: 0.54 mg/dL — ABNORMAL LOW (ref 0.61–1.24)
GFR, Estimated: >60 mL/min (ref >60)
Glucose, Bld: 108 mg/dL — ABNORMAL HIGH (ref 70–99)
Potassium: 3.2 mmol/L — ABNORMAL LOW (ref 3.5–5.1)
Sodium: 142 mmol/L (ref 135–145)
Total Bilirubin: 0.5 mg/dL (ref 0.0–1.2)
Total Protein: 5.7 g/dL — ABNORMAL LOW (ref 6.5–8.1)

## 2023-03-04 LAB — CBC WITH DIFFERENTIAL/PLATELET
Abs Immature Granulocytes: 0.08 10*3/uL — ABNORMAL HIGH (ref 0.00–0.07)
Basophils Absolute: 0 10*3/uL (ref 0.0–0.1)
Basophils Relative: 0 %
Eosinophils Absolute: 0 10*3/uL (ref 0.0–0.5)
Eosinophils Relative: 0 %
HCT: 29.1 % — ABNORMAL LOW (ref 39.0–52.0)
Hemoglobin: 8.9 g/dL — ABNORMAL LOW (ref 13.0–17.0)
Immature Granulocytes: 1 %
Lymphocytes Relative: 2 %
Lymphs Abs: 0.3 10*3/uL — ABNORMAL LOW (ref 0.7–4.0)
MCH: 27.6 pg (ref 26.0–34.0)
MCHC: 30.6 g/dL (ref 30.0–36.0)
MCV: 90.1 fL (ref 80.0–100.0)
Monocytes Absolute: 0.6 10*3/uL (ref 0.1–1.0)
Monocytes Relative: 4 %
Neutro Abs: 12 10*3/uL — ABNORMAL HIGH (ref 1.7–7.7)
Neutrophils Relative %: 93 %
Platelets: 574 10*3/uL — ABNORMAL HIGH (ref 150–400)
RBC: 3.23 MIL/uL — ABNORMAL LOW (ref 4.22–5.81)
RDW: 16 % — ABNORMAL HIGH (ref 11.5–15.5)
WBC: 13 10*3/uL — ABNORMAL HIGH (ref 4.0–10.5)
nRBC: 0 % (ref 0.0–0.2)

## 2023-03-04 LAB — MAGNESIUM: Magnesium: 1.9 mg/dL (ref 1.7–2.4)

## 2023-03-04 LAB — PHOSPHORUS: Phosphorus: 3.5 mg/dL (ref 2.5–4.6)

## 2023-03-04 MED ORDER — DEXTROSE-SODIUM CHLORIDE 5-0.45 % IV SOLN: INTRAVENOUS | Status: DC

## 2023-03-04 MED ORDER — OSMOLITE 1.5 CAL PO LIQD
1000.0000 mL | ORAL | Status: DC
  Administered 2023-03-04: 1000 mL
  Filled 2023-03-04 (×4): qty 1000

## 2023-03-04 MED ORDER — FREE WATER
150.0000 mL | Status: DC
  Administered 2023-03-04 – 2023-03-07 (×9): 150 mL

## 2023-03-04 NOTE — Progress Notes (Addendum)
PROGRESS NOTE        PATIENT DETAILS Name: Erik [name] Age: 58 y.o. Sex: male Date of Birth: 1965/05/03 Admit Date: 03/02/2023 Admitting Physician Lorin Glass, MD QMV:HQIONGEX, Amy J, NP  Brief Summary: Patient is a 58 y.o.  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  Initially admitted by [facility] and transferred to [facility].    Significant events: 1/31>> admit to ICU by PCCM 2/01>> transferred to [facility]  Significant studies: 1/31>> CXR: Extensive bronchiectasis/bronchitis-and patchy areas of consolidations bilaterally.  Significant microbiology data: 1/31>> COVID/influenza/RSV PCR: Negative 1/31>> blood culture: No growth  Procedures: None  Consults: PCCM  Subjective: No major issues overnight-asking for pain medications.  Spouse at bedside.  Objective: Vitals: Blood pressure 128/79, pulse 82, temperature 97.8 F (36.6 C), temperature source Oral, resp. rate 19, height 5\' 9"  (1.753 m), weight 54.4 kg, SpO2 92%.   Exam: Gen Exam:Alert awake-not in any distress HEENT:atraumatic, normocephalic Chest: B/L clear to auscultation anteriorly CVS:S1S2 regular Abdomen:soft non tender, non distended Extremities:no edema Neurology: Non focal Skin: no rash  Pertinent Labs/Radiology:    Latest Ref Rng & Units 03/04/2023    5:54 AM 03/02/2023    6:12 AM 07/06/2022    9:30 PM  CBC  WBC 4.0 - 10.5 K/uL 13.0  15.5  6.4   Hemoglobin 13.0 - 17.0 g/dL 8.9  9.6  52.8   Hematocrit 39.0 - 52.0 % 29.1  31.7  35.6   Platelets 150 - 400 K/uL 574  570  326     Lab Results  Component Value Date   NA 142  03/04/2023   K 3.2 (L) 03/04/2023   CL 103 03/04/2023   CO2 33 (H) 03/04/2023      Assessment/Plan: Severe sepsis secondary to aspiration pneumonia through tracheoesophageal fistula with acute on chronic hypoxic respiratory failure Slowly improving-sepsis physiology better On 10 L of oxygen Continue cefepime/Flagyl-okay to stop vancomycin Begin titrating down midodrine dosage Pulmonary toileting Mobilize as much as possible Discussed with patient/spouse-difficult situation-no definite options to treat underlying physiology-he will likely continue to have aspiration pneumonitis.  Palliative care following.  Transaminitis Probably due to low flow state in the setting of severe sepsis LFTs have improved significantly Follow periodically  Hypokalemia Replete/recheck  COPD Stable Bronchodilators  Hypothyroidism Synthroid  GERD PPI  Chronic pain Continue transdermal fentanyl-and continue to use Dilaudid for breakthrough.  Chronic dysphagia Continue tube feeds  History of tracheoesophageal fistula Reviewed ENT note from recent hospitalization at [facility]-currently not a candidate for surgical repair-his nutritional status has improved significantly for him to tolerate extensive reconstruction surgery.  Per discharge summary-GI/pulmonology unable to offer any other intervention. As noted above-he will likely continue to have aspiration episodes secondary to this fistula Palliative care following.  Laryngeal squamous cell carcinoma-s/p neck dissection/laryngectomy Dysphagia-G-tube in place Chronic hypoxic respiratory failure-has tracheotomy  Underweight: Estimated body mass index is 17.72 kg/m as calculated from the following:   Height as of this encounter: 5\' 9"  (1.753 m).   Weight as of this encounter: 54.4 kg.   Code status:   Code Status: Full Code   DVT Prophylaxis:  enoxaparin (LOVENOX) injection 40 mg Start: 03/03/23 0815 SCDs Start: 03/02/23  0804   Family Communication: Spouse at bedside   Disposition Plan: Status is: Inpatient Remains inpatient appropriate because: Severity of illness   Planned Discharge Destination:Home health   Diet: Diet Order             Diet NPO time specified  Diet effective now                     Antimicrobial agents: Anti-infectives (From admission, onward)    Start     Dose/Rate Route Frequency Ordered Stop   03/02/23 1900  vancomycin (VANCOREADY) IVPB 750 mg/150 mL  Status:  Discontinued        750 mg 150 mL/hr over 60 Minutes Intravenous Every 12 hours 03/02/23 1525 03/02/23 1536   03/02/23 1900  vancomycin (VANCOCIN) 750 mg in sodium chloride 0.9 % 250 mL IVPB  Status:  Discontinued        750 mg 250 mL/hr over 60 Minutes Intravenous Every 12 hours 03/02/23 1536 03/04/23 0834   03/02/23 1530  ceFEPIme (MAXIPIME) 2 g in sodium chloride 0.9 % 100 mL IVPB        2 g 200 mL/hr over 30 Minutes Intravenous Every 8 hours 03/02/23 1525     03/02/23 1515  metroNIDAZOLE (FLAGYL) IVPB 500 mg        500 mg 100 mL/hr over 60 Minutes Intravenous Every 12 hours 03/02/23 1504     03/02/23 0630  vancomycin (VANCOCIN) IVPB 1000 mg/200 mL premix  Status:  Discontinued        1,000 mg 200 mL/hr over 60 Minutes Intravenous  Once 03/02/23 0619 03/02/23 0626   03/02/23 0630  ceFEPIme (MAXIPIME) 2 g in sodium chloride 0.9 % 100 mL IVPB        2 g 200 mL/hr over 30 Minutes Intravenous  Once 03/02/23 0619 03/02/23 0700   03/02/23 0630  metroNIDAZOLE (FLAGYL) IVPB 500 mg        500 mg 100 mL/hr over 60 Minutes Intravenous  Once 03/02/23 0619 03/02/23 0734   03/02/23 0630  vancomycin (VANCOREADY) IVPB 1250 mg/250 mL        1,250 mg 166.7 mL/hr over 90 Minutes Intravenous  Once 03/02/23 1478 03/02/23 0820        MEDICATIONS: Scheduled Meds:  calcitRIOL  0.5 mcg Per Tube BID   enoxaparin (LOVENOX) injection  40 mg Subcutaneous Q24H   feeding supplement (PIVOT 1.5 CAL)  1,000 mL Per Tube  Q24H   feeding supplement (PROSource TF20)  60 mL Per Tube Daily   fentaNYL  1 patch Transdermal Q72H   levothyroxine  250 mcg Per J Tube Q0600   midodrine  10 mg Per Tube TID WC   mouth rinse  15 mL Mouth Rinse 4 times per day   pantoprazole (PROTONIX) IV  40 mg Intravenous Q12H   thiamine  100 mg Per Tube Daily   Continuous Infusions:  ceFEPime (MAXIPIME) IV 2 g (03/04/23 0824)   metronidazole 500 mg (03/04/23 0151)   PRN Meds:.acetaminophen **OR** acetaminophen, HYDROmorphone (DILAUDID) injection, mouth rinse   I have personally reviewed following labs and imaging studies  LABORATORY DATA: CBC: Recent Labs  Lab 03/02/23 0612 03/04/23 0554  WBC 15.5* 13.0*  NEUTROABS 14.9* 12.0*  HGB 9.6* 8.9*  HCT 31.7* 29.1*  MCV 91.4 90.1  PLT 570* 574*    Basic Metabolic Panel: Recent Labs  Lab 03/02/23 0612 03/02/23 1916 03/03/23 2956  03/04/23 0554  NA 141  --   --  142  K 3.6  --   --  3.2*  CL 99  --   --  103  CO2 33*  --   --  33*  GLUCOSE 96  --   --  108*  BUN 27*  --   --  33*  CREATININE 0.63  --   --  0.54*  CALCIUM 9.4  --   --  9.4  MG  --  1.6* 1.8 1.9  PHOS  --  4.3 5.8* 3.5    GFR: Estimated Creatinine Clearance: 77.4 mL/min (A) (by C-G formula based on SCr of 0.54 mg/dL (L)).  Liver Function Tests: Recent Labs  Lab 03/02/23 0612 03/03/23 0651 03/04/23 0554  AST 139* 30 21  ALT 151* 77* 57*  ALKPHOS 263* 173* 142*  BILITOT 0.9 0.8 0.5  PROT 6.6 5.8* 5.7*  ALBUMIN 2.0* 1.6* 1.7*   No results for input(s): "LIPASE", "AMYLASE" in the last 168 hours. No results for input(s): "AMMONIA" in the last 168 hours.  Coagulation Profile: Recent Labs  Lab 03/02/23 0612  INR 1.5*    Cardiac Enzymes: No results for input(s): "CKTOTAL", "CKMB", "CKMBINDEX", "TROPONINI" in the last 168 hours.  BNP (last 3 results) No results for input(s): "PROBNP" in the last 8760 hours.  Lipid Profile: No results for input(s): "CHOL", "HDL", "LDLCALC", "TRIG",  "CHOLHDL", "LDLDIRECT" in the last 72 hours.  Thyroid Function Tests: No results for input(s): "TSH", "T4TOTAL", "FREET4", "T3FREE", "THYROIDAB" in the last 72 hours.  Anemia Panel: No results for input(s): "VITAMINB12", "FOLATE", "FERRITIN", "TIBC", "IRON", "RETICCTPCT" in the last 72 hours.  Urine analysis: No results found for: "COLORURINE", "APPEARANCEUR", "LABSPEC", "PHURINE", "GLUCOSEU", "HGBUR", "BILIRUBINUR", "KETONESUR", "PROTEINUR", "UROBILINOGEN", "NITRITE", "LEUKOCYTESUR"  Sepsis Labs: Lactic Acid, Venous    Component Value Date/Time   LATICACIDVEN 1.2 03/03/2023 1426    MICROBIOLOGY: Recent Results (from the past 240 hours)  Resp panel by RT-PCR (RSV, Flu A&B, Covid) Anterior Nasal Swab     Status: None   Collection Time: 03/02/23  5:36 AM   Specimen: Anterior Nasal Swab  Result Value Ref Range Status   SARS Coronavirus 2 by RT PCR NEGATIVE NEGATIVE Final   Influenza A by PCR NEGATIVE NEGATIVE Final   Influenza B by PCR NEGATIVE NEGATIVE Final    Comment: (NOTE) The Xpert Xpress SARS-CoV-2/FLU/RSV plus assay is intended as an aid in the diagnosis of influenza from Nasopharyngeal swab specimens and should not be used as a sole basis for treatment. Nasal washings and aspirates are unacceptable for Xpert Xpress SARS-CoV-2/FLU/RSV testing.  Fact Sheet for Patients: BloggerCourse.com  Fact Sheet for Healthcare Providers: SeriousBroker.it  This test is not yet approved or cleared by the [place] FDA and has been authorized for detection and/or diagnosis of SARS-CoV-2 by FDA under an Emergency Use Authorization (EUA). This EUA will remain in effect (meaning this test can be used) for the duration of the COVID-19 declaration under Section 564(b)(1) of the Act, 21 U.S.C. section 360bbb-3(b)(1), unless the authorization is terminated or revoked.     Resp Syncytial Virus by PCR NEGATIVE NEGATIVE Final     Comment: (NOTE) Fact Sheet for Patients: BloggerCourse.com  Fact Sheet for Healthcare Providers: SeriousBroker.it  This test is not yet approved or cleared by the [place] FDA and has been authorized for detection and/or diagnosis of SARS-CoV-2 by FDA under an Emergency Use Authorization (EUA). This EUA will remain in effect (meaning this test can  be used) for the duration of the COVID-19 declaration under Section 564(b)(1) of the Act, 21 U.S.C. section 360bbb-3(b)(1), unless the authorization is terminated or revoked.  Performed at [facility], [address], [place], [place] [address]   Blood Culture (routine x 2)     Status: None (Preliminary result)   Collection Time: 03/02/23  6:09 AM   Specimen: BLOOD  Result Value Ref Range Status   Specimen Description BLOOD RIGHT ANTECUBITAL  Final   Special Requests   Final    BOTTLES DRAWN AEROBIC AND ANAEROBIC Blood Culture adequate volume   Culture   Final    NO GROWTH 2 DAYS Performed at [facility], [address] [place], [place] [address]    Report Status PENDING  Incomplete  Blood Culture (routine x 2)     Status: None (Preliminary result)   Collection Time: 03/02/23  6:12 AM   Specimen: BLOOD RIGHT HAND  Result Value Ref Range Status   Specimen Description BLOOD RIGHT HAND  Final   Special Requests   Final    BOTTLES DRAWN AEROBIC ONLY Blood Culture adequate volume   Culture   Final    NO GROWTH 2 DAYS Performed at [facility], [address], [place], [place] [address]    Report Status PENDING  Incomplete  MRSA Next Gen by PCR, Nasal     Status: None   Collection Time: 03/03/23  4:35 PM   Specimen: Nasal Mucosa; Nasal Swab  Result Value Ref Range Status   MRSA by PCR Next Gen NOT DETECTED NOT DETECTED Final    Comment: (NOTE) The GeneXpert MRSA Assay (FDA approved for NASAL specimens only), is one component of a comprehensive MRSA  colonization surveillance program. It is not intended to diagnose MRSA infection nor to guide or monitor treatment for MRSA infections. Test performance is not FDA approved in patients less than 74 years old. Performed at [facility], [address], [place], [place] [address]     RADIOLOGY STUDIES/RESULTS: No results found.   LOS: 2 days   Jeoffrey Massed, MD  [facility]    To contact the attending provider between 7A-7P or the covering provider during after hours 7P-7A, please log into the web site [email or web address] and access using universal [place] password for that web site. If you do not have the password, please call the hospital operator.  03/04/2023, 9:54 AM

## 2023-03-04 NOTE — Plan of Care (Signed)

## 2023-03-04 NOTE — Progress Notes (Addendum)
   RN reported that patient has a tracheoesophageal fistula.  Currently has a PEG tube feeding on 30 mL/h.  RN suctioned and found out that tube feeding coming from laryngostomy stroma. -Per chart review discussion from ENT note from recent hospitalization at [facility]-currently not a candidate for surgical repair. his nutritional status has improved significantly for him to tolerate extensive reconstruction surgery.  Per discharge summary-GI/pulmonology unable to offer any other intervention. As noted above-he will likely continue to have aspiration episodes secondary to this fistula.    -As nursing is stating that patient has tube feeding coming out from the laryngeal stroma I have recommended to hold the tube feeding for tonight tube to prevent further aspiration. -Need to reach out to pulmonology or patient's ENT at [facility] to discuss regarding continuous aspiration and food coming throughout the laryngeal stroma and need to discuss alternative options. -Starting D5 half-normal 40 cc/h as maintenance fluid. - If further discussion with ENT or pulmonology pulmonology there is nothing to offer for this patient to repair of the endotracheal fistula in that case patient might need PICC line for TPN.  Tereasa Coop, MD [facility] 03/04/2023, 10:51 PM

## 2023-03-04 NOTE — Progress Notes (Signed)
PT Cancellation Note  Patient Details Name: Erik [name] MRN: [id number] DOB: [birth year]   Cancelled Treatment:    Reason Eval/Treat Not Completed: Other (comment). Pt asked to defer until later today. Will return later.   Angelina Ok [facility] 03/04/2023, 11:59 AM Skip Mayer PT Acute [employer] [phone]

## 2023-03-04 NOTE — Consult Note (Signed)
WOC Nurse Consult Note: Reason for Consult: sacral pressure injury Discussed with bedside nursing  Patient from home, SOB, history of laryngeal CA Wound type: partial thickness opening Pressure Injury POA: Yes Measurement: see nursing flow sheet Wound bed: pink Drainage (amount, consistency, odor) none Periwound:intact Dressing procedure/placement/frequency: Silicone foam dressings to the sacrum, change every 3 days. ASSESS UNDER dressings each shift for any acute changes in the wounds.   Turn and reposition per hospital protocol   Re consult if needed, will not follow at this time. Thanks  Nike Southwell [employer], RN,CWOCN, CNS, CWON-AP [phone])

## 2023-03-04 NOTE — Progress Notes (Addendum)
Initial Nutrition Assessment  DOCUMENTATION CODES:   Not applicable  INTERVENTION:  Initiate tube feeding via PEG: Osmolite 1.5  at 45 ml/h (1080 ml per day) Once current feeding of pivot depleted change to Osmolite 1.5 starting @ 63ml/hr)  FWF; every 4 hours. Prosource TF20 60 ml daily  Provides 1700 kcal, 88 gm protein, 1723 ml free water daily    NUTRITION DIAGNOSIS:   Increased nutrient needs related to chronic illness as evidenced by NPO status, estimated needs.    GOAL:   Patient will meet greater than or equal to 90% of their needs    MONITOR:   TF tolerance  REASON FOR ASSESSMENT:   Consult Enteral/tube feeding initiation and management  ASSESSMENT:   58 y.o.M, presented to ED 1/31 with complaints of SOB. Recently admitted to [facility] for pneumonia. Admitted with principal problem of acute on chronic respiratory failure.  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 All information obtained through EMR and team.currently EN dependent with TF feeding of Pivot 1.5 2 63ml/hr. PSTF 60ml daily. Reports of good tolerance.  No current BM noted. Chewing swallowing concerns noted AEB EN dependent.  Significant events: 1/31>> admit to ICU by PCCM 2/01>> transferred to [facility]   Significant studies: 1/31>> CXR: Extensive bronchiectasis/bronchitis-and patchy areas of consolidations bilaterally.   Significant microbiology data: 1/31>> COVID/influenza/RSV PCR: Negative 1/31>> blood culture: No growth Admit weight: 54.4 kg Weight history: 03/02/23 54.4 kg  10/26/21 55.8 kg  07/19/21 58.1 kg  04/12/21 59.6 kg  01/14/21 55.5 kg  10/12/20 57.4 kg  09/22/20 54.3 kg  09/14/20 53 kg  09/03/20 49.4 kg  08/20/20 50.7 kg      Average Meal Intake: NPO  Nutritionally Relevant  Medications: Scheduled Meds:  calcitRIOL  0.5 mcg Per Tube BID   feeding supplement (PIVOT 1.5 CAL)  1,000 mL Per Tube Q24H   feeding supplement (PROSource TF20)  60 mL Per Tube Daily   fentaNYL  1 patch Transdermal Q72H   thiamine  100 mg Per Tube Daily     Labs Reviewed    NUTRITION - FOCUSED PHYSICAL EXAM:  Deferred   Diet Order:   Diet Order             Diet NPO time specified  Diet effective now                   EDUCATION NEEDS:   Education needs have been addressed  Skin:  Skin Assessment: Reviewed RN Assessment  Last BM:  PTA  Height:   Ht Readings from Last 1 Encounters:  03/02/23 5\' 9"  (1.753 m)    Weight:   Wt Readings from Last 1 Encounters:  03/02/23 54.4 kg    Ideal Body Weight:     BMI:  Body mass index is 17.72 kg/m.  Estimated Nutritional Needs:   Kcal:  1650-1925 kcal  Protein:  75- 90 g  Fluid:  74ml/kcal    Jamelle Haring RDN, LDN Clinical Dietitian   If unable to reach, please contact "[facility] Inpatient" secure chat group between 8 am-4 pm daily"

## 2023-03-04 NOTE — Progress Notes (Signed)
Pharmacy notified that there was 2 different orders for levothyroxine 112 mcg and .  Verified with patient that he takes levothyroxine to .  Discontinued the other levothyroxine .   Tereasa Coop, MD [facility] 03/04/2023, 5:10 AM

## 2023-03-04 NOTE — Evaluation (Addendum)
Physical Therapy Evaluation Patient Details Name: Erik [name] MRN: [id number] DOB: 1965-05-27 Today's Date: 03/04/2023  History of Present Illness  Pt is 58 year old presented to [facility] on  03/02/23 for SOB. Pt with acute on chronic respiratory failure due to pna due to aspiration through tracheoesophageal fistula. 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 Pt needs skilled PT to maximize independence and safety. Pt with generalized weakness and deconditioning due to multiple medical issues, poor nutritional status, and subsequent low levels of activity. Wife supportive. Will work toward improving mobility to allow pt to move around his mobile home with less of a fall risk. Could benefit from HHPT but lack of payor source may hinder that.           If plan is discharge home, recommend the following: A little help with walking and/or transfers;A little help with bathing/dressing/bathroom;Assistance with cooking/housework;Assist for transportation;Help with stairs or ramp for entrance   Can travel by private vehicle        Equipment Recommendations None recommended by PT  Recommendations for Other Services       Functional Status Assessment Patient has had a recent decline in their functional status and demonstrates the ability to make significant improvements in function in a reasonable and predictable amount of time.     Precautions / Restrictions Precautions Precautions: Fall Precaution Comments: trach collar, watch SpO2 Restrictions Weight Bearing Restrictions Per Provider Order: No      Mobility  Bed Mobility Overal bed mobility: Needs Assistance Bed Mobility: Supine to Sit, Sit to Supine     Supine to sit: Min assist Sit to supine: Supervision    General bed mobility comments: Assist to elevate trunk into sitting    Transfers Overall transfer level: Needs assistance Equipment used: Rollator (4 wheels) Transfers: Sit to/from Stand Sit to Stand: Min assist           General transfer comment: Assist to power up and stabilize    Ambulation/Gait Ambulation/Gait assistance: Min assist Gait Distance (Feet): 30 Feet Assistive device: Rolling walker (2 wheels) Gait Pattern/deviations: Step-through pattern, Decreased step length - right, Decreased step length - left, Decreased stride length, Trunk flexed Gait velocity: decr Gait velocity interpretation: <1.31 ft/sec, indicative of household ambulator   General Gait Details: Assist for balance and support  Stairs            Wheelchair Mobility     Tilt Bed    Modified Rankin (Stroke Patients Only)       Balance Overall balance assessment: Needs assistance Sitting-balance support: No upper extremity supported, Feet supported Sitting balance-Leahy Scale: Good     Standing balance support: Bilateral upper extremity supported, During functional activity, Reliant on assistive device for balance Standing balance-Leahy Scale: Poor Standing balance comment: walker and min assist for static standing                             Pertinent Vitals/Pain Pain Assessment Pain Assessment: Faces Faces Pain Scale: Hurts a little bit Pain Location: generalized Pain Descriptors / Indicators: Guarding Pain Intervention(s): Premedicated before session, Limited activity within patient's tolerance    Home Living Family/patient expects to be discharged to:: Private residence Living Arrangements: Spouse/significant other Available Help at Discharge: Family;Available 24 hours/day Type  of Home: Mobile home Home Access: Stairs to enter Entrance Stairs-Rails: Right Entrance Stairs-Number of Steps: 4   Home Layout: One level Home Equipment: Agricultural consultant (2  wheels);BSC/3in1      Prior Function Prior Level of Function : Needs assist             Mobility Comments: Since recent dc from [facility] pt with fall and difficulty ambulating       Extremity/Trunk Assessment   Upper Extremity Assessment Upper Extremity Assessment: Defer to OT evaluation    Lower Extremity Assessment Lower Extremity Assessment: Generalized weakness       Communication   Communication Communication: Tracheostomy (Pt with laryngectomy. Writes for communication)  Cognition Arousal: Alert Behavior During Therapy: WFL for tasks assessed/performed Overall Cognitive Status: Within Functional Limits for tasks assessed                                          General Comments General comments (skin integrity, edema, etc.): Pt on 50% trach collar. SpO2 86-89% when pleth good.    Exercises     Assessment/Plan    PT Assessment Patient needs continued PT services  PT Problem List Decreased strength;Decreased activity tolerance;Decreased balance;Decreased mobility;Cardiopulmonary status limiting activity;Decreased knowledge of use of DME       PT Treatment Interventions DME instruction;Gait training;Functional mobility training;Therapeutic activities;Therapeutic exercise;Balance training;Patient/family education;Stair training    PT Goals (Current goals can be found in the Care Plan section)  Acute Rehab PT Goals Patient Stated Goal: return home PT Goal Formulation: With patient Time For Goal Achievement: 03/18/23 Potential to Achieve Goals: Good    Frequency Min 1X/week     Co-evaluation               AM-PAC PT "6 Clicks" Mobility  Outcome Measure Help needed turning from your back to your side while in a flat bed without using bedrails?: None Help needed moving from lying on your back to sitting on the side of a flat bed without using bedrails?: A Little Help needed moving to and from a bed to a chair (including  a wheelchair)?: A Little Help needed standing up from a chair using your arms (e.g., wheelchair or bedside chair)?: A Little Help needed to walk in hospital room?: A Little Help needed climbing 3-5 steps with a railing? : A Lot 6 Click Score: 18    End of Session Equipment Utilized During Treatment: Oxygen Activity Tolerance: Patient limited by fatigue Patient left: in bed;with call bell/phone within reach;with family/visitor present;with bed alarm set Nurse Communication: Mobility status PT Visit Diagnosis: Unsteadiness on feet (R26.81);Other abnormalities of gait and mobility (R26.89);Muscle weakness (generalized) (M62.81);History of falling (Z91.81)    Time: 1610-9604 PT Time Calculation (min) (ACUTE ONLY): 34 min   Charges:   PT Evaluation $PT Eval Moderate Complexity: 1 Mod PT Treatments $Gait Training: 8-22 mins PT General Charges $$ ACUTE PT VISIT: 1 Visit         [facility] PT Acute Rehabilitation Services Office [phone]   Angelina Ok [facility] 03/04/2023, 3:00 PM

## 2023-03-05 ENCOUNTER — Encounter: Payer: Self-pay | Admitting: Hematology and Oncology

## 2023-03-05 ENCOUNTER — Inpatient Hospital Stay (HOSPITAL_COMMUNITY): Payer: MEDICAID

## 2023-03-05 DIAGNOSIS — T17908S Unspecified foreign body in respiratory tract, part unspecified causing other injury, sequela: Secondary | ICD-10-CM

## 2023-03-05 DIAGNOSIS — Z66 Do not resuscitate: Secondary | ICD-10-CM

## 2023-03-05 LAB — COMPREHENSIVE METABOLIC PANEL
ALT: 46 U/L — ABNORMAL HIGH (ref 0–44)
AST: 19 U/L (ref 15–41)
Albumin: 1.9 g/dL — ABNORMAL LOW (ref 3.5–5.0)
Alkaline Phosphatase: 139 U/L — ABNORMAL HIGH (ref 38–126)
Anion gap: 12 (ref 5–15)
BUN: 20 mg/dL (ref 6–20)
CO2: 32 mmol/L (ref 22–32)
Calcium: 9.6 mg/dL (ref 8.9–10.3)
Chloride: 96 mmol/L — ABNORMAL LOW (ref 98–111)
Creatinine, Ser: 0.47 mg/dL — ABNORMAL LOW (ref 0.61–1.24)
GFR, Estimated: >60 mL/min (ref >60)
Glucose, Bld: 87 mg/dL (ref 70–99)
Potassium: 2.5 mmol/L — CL (ref 3.5–5.1)
Sodium: 140 mmol/L (ref 135–145)
Total Bilirubin: 0.6 mg/dL (ref 0.0–1.2)
Total Protein: 6.1 g/dL — ABNORMAL LOW (ref 6.5–8.1)

## 2023-03-05 LAB — BASIC METABOLIC PANEL
Anion gap: 10 (ref 5–15)
BUN: 17 mg/dL (ref 6–20)
CO2: 31 mmol/L (ref 22–32)
Calcium: 9.5 mg/dL (ref 8.9–10.3)
Chloride: 99 mmol/L (ref 98–111)
Creatinine, Ser: 0.65 mg/dL (ref 0.61–1.24)
GFR, Estimated: >60 mL/min (ref >60)
Glucose, Bld: 95 mg/dL (ref 70–99)
Potassium: 3.3 mmol/L — ABNORMAL LOW (ref 3.5–5.1)
Sodium: 140 mmol/L (ref 135–145)

## 2023-03-05 LAB — PHOSPHORUS: Phosphorus: 2.2 mg/dL — ABNORMAL LOW (ref 2.5–4.6)

## 2023-03-05 LAB — GLUCOSE, CAPILLARY
Glucose-Capillary: 69 mg/dL — ABNORMAL LOW (ref 70–99)
Glucose-Capillary: 77 mg/dL (ref 70–99)
Glucose-Capillary: 83 mg/dL (ref 70–99)
Glucose-Capillary: 94 mg/dL (ref 70–99)
Glucose-Capillary: 95 mg/dL (ref 70–99)

## 2023-03-05 LAB — MAGNESIUM: Magnesium: 1.9 mg/dL (ref 1.7–2.4)

## 2023-03-05 MED ORDER — DEXTROSE-SODIUM CHLORIDE 5-0.45 % IV SOLN: INTRAVENOUS | Status: AC

## 2023-03-05 MED ORDER — POTASSIUM CHLORIDE 10 MEQ/100ML IV SOLN
INTRAVENOUS | Status: AC
  Filled 2023-03-05: qty 100

## 2023-03-05 MED ORDER — POTASSIUM PHOSPHATES 15 MMOLE/5ML IV SOLN
30.0000 mmol | Freq: Once | INTRAVENOUS | Status: AC
  Administered 2023-03-05: 30 mmol via INTRAVENOUS
  Filled 2023-03-05: qty 10

## 2023-03-05 MED ORDER — POTASSIUM CHLORIDE 10 MEQ/100ML IV SOLN
10.0000 meq | INTRAVENOUS | Status: AC
  Administered 2023-03-05 (×6): 10 meq via INTRAVENOUS
  Filled 2023-03-05 (×3): qty 100

## 2023-03-05 MED ORDER — MAGNESIUM SULFATE 2 GM/50ML IV SOLN
2.0000 g | Freq: Once | INTRAVENOUS | Status: AC
  Administered 2023-03-05: 2 g via INTRAVENOUS
  Filled 2023-03-05: qty 50

## 2023-03-05 NOTE — Progress Notes (Signed)
Pt initially found on 100% fio2.  Weaned to 50% as previously charted, sat 96%.  MD came to room- discussed weaning oxygen further.  Pt placed on 35% ATC.  10-15 minutes late, RN called d/t sat 82-84%- no distress noted.  Fio2 increased to 100%, sat now 90-92%.  No distress noted currently.

## 2023-03-05 NOTE — Plan of Care (Signed)
  Problem: Clinical Measurements: Goal: Ability to maintain clinical measurements within normal limits will improve Outcome: Progressing Goal: Will remain free from infection Outcome: Progressing Goal: Cardiovascular complication will be avoided Outcome: Progressing   Problem: Elimination: Goal: Will not experience complications related to bowel motility Outcome: Progressing Goal: Will not experience complications related to urinary retention Outcome: Progressing   Problem: Safety: Goal: Ability to remain free from injury will improve Outcome: Progressing   Problem: Skin Integrity: Goal: Risk for impaired skin integrity will decrease Outcome: Progressing   Problem: Education: Goal: Knowledge of General Education information will improve Description: Including pain rating scale, medication(s)/side effects and non-pharmacologic comfort measures Outcome: Not Progressing   Problem: Health Behavior/Discharge Planning: Goal: Ability to manage health-related needs will improve Outcome: Not Progressing   Problem: Clinical Measurements: Goal: Diagnostic test results will improve Outcome: Not Progressing Goal: Respiratory complications will improve Outcome: Not Progressing   Problem: Activity: Goal: Risk for activity intolerance will decrease Outcome: Not Progressing   Problem: Nutrition: Goal: Adequate nutrition will be maintained Outcome: Not Progressing   Problem: Coping: Goal: Level of anxiety will decrease Outcome: Not Progressing   Problem: Pain Managment: Goal: General experience of comfort will improve and/or be controlled Outcome: Not Progressing

## 2023-03-05 NOTE — Progress Notes (Signed)
PROGRESS NOTE        PATIENT DETAILS Name: Erik [name] Age: 58 y.o. Sex: male Date of Birth: 1965/03/27 Admit Date: 03/02/2023 Admitting Physician Lorin Glass, MD VFI:EPPIRJJO, Amy J, NP  Brief Summary: Patient is a 58 y.o.  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  Initially admitted by [facility] and transferred to [facility].    Significant events: 1/31>> admit to ICU by PCCM 2/01>> transferred to [facility] 2/02>> noted to have tube feeds when tracheostomy suctioned-tube feeds now stopped.  Significant studies: 1/31>> CXR: Extensive bronchiectasis/bronchitis-and patchy areas of consolidations bilaterally.  Significant microbiology data: 1/31>> COVID/influenza/RSV PCR: Negative 1/31>> blood culture: No growth  Procedures: None  Consults: PCCM Palliative care  Subjective: Asking for pain medications-he appears comfortable but still on 100% FiO2.  He writes and communicates.    Objective: Vitals: Blood pressure 119/73, pulse 81, temperature 97.6 F (36.4 C), temperature source Oral, resp. rate 15, height 5\' 9"  (1.753 m), weight 41 kg, SpO2 92%.   Exam: Gen Exam: Awake/alert-not in any distress. HEENT:atraumatic, normocephalic Chest: Some transmitted upper airway sounds-moving air well. CVS:S1S2 regular Abdomen:soft non tender, non distended Extremities:no edema Neurology: Non focal Skin: no rash  Pertinent Labs/Radiology:    Latest Ref Rng & Units 03/04/2023    5:54 AM 03/02/2023    6:12 AM 07/06/2022    9:30 PM  CBC  WBC 4.0 - 10.5 K/uL 13.0  15.5  6.4   Hemoglobin 13.0 - 17.0 g/dL 8.9   9.6  84.1   Hematocrit 39.0 - 52.0 % 29.1  31.7  35.6   Platelets 150 - 400 K/uL 574  570  326     Lab Results  Component Value Date   NA 140 03/05/2023   K 2.5 (LL) 03/05/2023   CL 96 (L) 03/05/2023   CO2 32 03/05/2023      Assessment/Plan: Severe sepsis secondary to aspiration pneumonia through tracheoesophageal fistula with acute on chronic hypoxic respiratory failure Worsening hypoxia-on 15 L of oxygen-100% FiO2-noted to have tube feeds when trachea suctioned overnight Tube feeds held for now Continue cefepime/Flagyl Discussed with patient-I actually drew a picture explaining what a fistula was between his esophagus and his trachea.  Explained that at this point really no good options to fix his aspiration issues through the fistula-and that he best served by initiation of comfort measures.  I subsequently called his wife-and had a long similar discussion as well-all aware of tenuous situation and that we have no options of fixing his underlying issues.  Family to iscuss-palliative care following-will await further recommendations.  Transaminitis Probably due to low flow state in the setting of severe sepsis LFTs have improved significantly Follow periodically  Hypokalemia/hypophosphatemia/hypomagnesemia Replete/recheck.  COPD Stable Bronchodilators  Hypothyroidism Synthroid  GERD PPI  Chronic pain Continue transdermal fentanyl-and continue to use Dilaudid for breakthrough.  Chronic dysphagia Has G-tube in place-appears to have significant reflux causing aspiration of tube feeds into the lungs via existing tracheoesophageal fistula-he was noted to have tube feeds on tracheal suctioning last night.  Holding G-tube feeding today-goals of care ongoing-see above.  History of tracheoesophageal fistula Reviewed  ENT note from recent hospitalization at [facility]-currently not a candidate for surgical repair-his nutritional status has improved significantly for him  to tolerate extensive reconstruction surgery.  Per discharge summary-GI/pulmonology unable to offer any other intervention.  Family aware that he will likely continue to have aspiration of saliva/secretions or G-tube feeds-await family discussion-await further evaluation by palliative care.  Laryngeal squamous cell carcinoma-s/p neck dissection/laryngectomy Dysphagia-G-tube in place Chronic hypoxic respiratory failure-has tracheotomy  Underweight: Estimated body mass index is 13.35 kg/m as calculated from the following:   Height as of this encounter: 5\' 9"  (1.753 m).   Weight as of this encounter: 41 kg.   Code status:   Code Status: Full Code   DVT Prophylaxis: enoxaparin (LOVENOX) injection 40 mg Start: 03/03/23 0815 SCDs Start: 03/02/23 0804   Family Communication: Spouse-stable-[phone]-over the phone-2/3   Disposition Plan: Status is: Inpatient Remains inpatient appropriate because: Severity of illness   Planned Discharge Destination:Home health   Diet: Diet Order             Diet NPO time specified  Diet effective now                     Antimicrobial agents: Anti-infectives (From admission, onward)    Start     Dose/Rate Route Frequency Ordered Stop   03/02/23 1900  vancomycin (VANCOREADY) IVPB 750 mg/150 mL  Status:  Discontinued        750 mg 150 mL/hr over 60 Minutes Intravenous Every 12 hours 03/02/23 1525 03/02/23 1536   03/02/23 1900  vancomycin (VANCOCIN) 750 mg in sodium chloride 0.9 % 250 mL IVPB  Status:  Discontinued        750 mg 250 mL/hr over 60 Minutes Intravenous Every 12 hours 03/02/23 1536 03/04/23 0834   03/02/23 1530  ceFEPIme (MAXIPIME) 2 g in sodium chloride 0.9 % 100 mL IVPB        2 g 200 mL/hr over 30 Minutes Intravenous Every 8 hours 03/02/23 1525     03/02/23 1515  metroNIDAZOLE (FLAGYL) IVPB 500 mg        500 mg 100 mL/hr over 60 Minutes Intravenous Every 12 hours 03/02/23 1504     03/02/23 0630  vancomycin (VANCOCIN)  IVPB 1000 mg/200 mL premix  Status:  Discontinued        1,000 mg 200 mL/hr over 60 Minutes Intravenous  Once 03/02/23 0619 03/02/23 0626   03/02/23 0630  ceFEPIme (MAXIPIME) 2 g in sodium chloride 0.9 % 100 mL IVPB        2 g 200 mL/hr over 30 Minutes Intravenous  Once 03/02/23 0619 03/02/23 0700   03/02/23 0630  metroNIDAZOLE (FLAGYL) IVPB 500 mg        500 mg 100 mL/hr over 60 Minutes Intravenous  Once 03/02/23 0619 03/02/23 0734   03/02/23 0630  vancomycin (VANCOREADY) IVPB 1250 mg/250 mL        1,250 mg 166.7 mL/hr over 90 Minutes Intravenous  Once 03/02/23 0627 03/02/23 0820        MEDICATIONS: Scheduled Meds:  calcitRIOL  0.5 mcg Per Tube BID   enoxaparin (LOVENOX) injection  40 mg Subcutaneous Q24H   feeding supplement (PROSource TF20)  60 mL Per Tube Daily   fentaNYL  1 patch Transdermal Q72H   free water  150 mL Per Tube Q4H   levothyroxine  250 mcg Per J Tube Q0600   midodrine  10 mg Per Tube TID WC   mouth rinse  15 mL Mouth Rinse 4 times per day   pantoprazole (PROTONIX) IV  40 mg Intravenous Q12H   thiamine  100 mg Per Tube Daily   Continuous Infusions:  ceFEPime (MAXIPIME) IV 2 g (03/05/23 0914)   dextrose 5 % and 0.45 % NaCl 40 mL/hr at 03/05/23 0913   feeding supplement (OSMOLITE 1.5 CAL) Stopped (03/04/23 2300)   metronidazole 500 mg (03/05/23 0231)   potassium chloride 10 mEq (03/05/23 0705)   potassium PHOSPHATE IVPB (in mmol)     PRN Meds:.acetaminophen **OR** acetaminophen, HYDROmorphone (DILAUDID) injection, mouth rinse   I have personally reviewed following labs and imaging studies  LABORATORY DATA: CBC: Recent Labs  Lab 03/02/23 0612 03/04/23 0554  WBC 15.5* 13.0*  NEUTROABS 14.9* 12.0*  HGB 9.6* 8.9*  HCT 31.7* 29.1*  MCV 91.4 90.1  PLT 570* 574*    Basic Metabolic Panel: Recent Labs  Lab 03/02/23 0612 03/02/23 1916 03/03/23 0651 03/04/23 0554 03/05/23 0423  NA 141  --   --  142 140  K 3.6  --   --  3.2* 2.5*  CL 99  --   --   103 96*  CO2 33*  --   --  33* 32  GLUCOSE 96  --   --  108* 87  BUN 27*  --   --  33* 20  CREATININE 0.63  --   --  0.54* 0.47*  CALCIUM 9.4  --   --  9.4 9.6  MG  --  1.6* 1.8 1.9 1.9  PHOS  --  4.3 5.8* 3.5 2.2*    GFR: Estimated Creatinine Clearance: 58.4 mL/min (A) (by C-G formula based on SCr of 0.47 mg/dL (L)).  Liver Function Tests: Recent Labs  Lab 03/02/23 0612 03/03/23 0651 03/04/23 0554 03/05/23 0423  AST 139* 30 21 19   ALT 151* 77* 57* 46*  ALKPHOS 263* 173* 142* 139*  BILITOT 0.9 0.8 0.5 0.6  PROT 6.6 5.8* 5.7* 6.1*  ALBUMIN 2.0* 1.6* 1.7* 1.9*   No results for input(s): "LIPASE", "AMYLASE" in the last 168 hours. No results for input(s): "AMMONIA" in the last 168 hours.  Coagulation Profile: Recent Labs  Lab 03/02/23 0612  INR 1.5*    Cardiac Enzymes: No results for input(s): "CKTOTAL", "CKMB", "CKMBINDEX", "TROPONINI" in the last 168 hours.  BNP (last 3 results) No results for input(s): "PROBNP" in the last 8760 hours.  Lipid Profile: No results for input(s): "CHOL", "HDL", "LDLCALC", "TRIG", "CHOLHDL", "LDLDIRECT" in the last 72 hours.  Thyroid Function Tests: No results for input(s): "TSH", "T4TOTAL", "FREET4", "T3FREE", "THYROIDAB" in the last 72 hours.  Anemia Panel: No results for input(s): "VITAMINB12", "FOLATE", "FERRITIN", "TIBC", "IRON", "RETICCTPCT" in the last 72 hours.  Urine analysis: No results found for: "COLORURINE", "APPEARANCEUR", "LABSPEC", "PHURINE", "GLUCOSEU", "HGBUR", "BILIRUBINUR", "KETONESUR", "PROTEINUR", "UROBILINOGEN", "NITRITE", "LEUKOCYTESUR"  Sepsis Labs: Lactic Acid, Venous    Component Value Date/Time   LATICACIDVEN 1.2 03/03/2023 1426    MICROBIOLOGY: Recent Results (from the past 240 hours)  Resp panel by RT-PCR (RSV, Flu A&B, Covid) Anterior Nasal Swab     Status: None   Collection Time: 03/02/23  5:36 AM   Specimen: Anterior Nasal Swab  Result Value Ref Range Status   SARS Coronavirus 2 by RT PCR  NEGATIVE NEGATIVE Final   Influenza A by PCR NEGATIVE NEGATIVE Final   Influenza B by PCR NEGATIVE NEGATIVE Final    Comment: (NOTE) The Xpert Xpress SARS-CoV-2/FLU/RSV plus assay is intended as an aid in the  diagnosis of influenza from Nasopharyngeal swab specimens and should not be used as a sole basis for treatment. Nasal washings and aspirates are unacceptable for Xpert Xpress SARS-CoV-2/FLU/RSV testing.  Fact Sheet for Patients: BloggerCourse.com  Fact Sheet for Healthcare Providers: SeriousBroker.it  This test is not yet approved or cleared by the [place] FDA and has been authorized for detection and/or diagnosis of SARS-CoV-2 by FDA under an Emergency Use Authorization (EUA). This EUA will remain in effect (meaning this test can be used) for the duration of the COVID-19 declaration under Section 564(b)(1) of the Act, 21 U.S.C. section 360bbb-3(b)(1), unless the authorization is terminated or revoked.     Resp Syncytial Virus by PCR NEGATIVE NEGATIVE Final    Comment: (NOTE) Fact Sheet for Patients: BloggerCourse.com  Fact Sheet for Healthcare Providers: SeriousBroker.it  This test is not yet approved or cleared by the [place] FDA and has been authorized for detection and/or diagnosis of SARS-CoV-2 by FDA under an Emergency Use Authorization (EUA). This EUA will remain in effect (meaning this test can be used) for the duration of the COVID-19 declaration under Section 564(b)(1) of the Act, 21 U.S.C. section 360bbb-3(b)(1), unless the authorization is terminated or revoked.  Performed at [facility], [address], [place], [place] [address]   Blood Culture (routine x 2)     Status: None (Preliminary result)   Collection Time: 03/02/23  6:09 AM   Specimen: BLOOD  Result Value Ref Range Status   Specimen Description BLOOD RIGHT ANTECUBITAL  Final    Special Requests   Final    BOTTLES DRAWN AEROBIC AND ANAEROBIC Blood Culture adequate volume   Culture   Final    NO GROWTH 3 DAYS Performed at [facility], [address] [place], [place] [address]    Report Status PENDING  Incomplete  Blood Culture (routine x 2)     Status: None (Preliminary result)   Collection Time: 03/02/23  6:12 AM   Specimen: BLOOD RIGHT HAND  Result Value Ref Range Status   Specimen Description BLOOD RIGHT HAND  Final   Special Requests   Final    BOTTLES DRAWN AEROBIC ONLY Blood Culture adequate volume   Culture   Final    NO GROWTH 3 DAYS Performed at [facility], [address], [place], [place] [address]    Report Status PENDING  Incomplete  MRSA Next Gen by PCR, Nasal     Status: None   Collection Time: 03/03/23  4:35 PM   Specimen: Nasal Mucosa; Nasal Swab  Result Value Ref Range Status   MRSA by PCR Next Gen NOT DETECTED NOT DETECTED Final    Comment: (NOTE) The GeneXpert MRSA Assay (FDA approved for NASAL specimens only), is one component of a comprehensive MRSA colonization surveillance program. It is not intended to diagnose MRSA infection nor to guide or monitor treatment for MRSA infections. Test performance is not FDA approved in patients less than 95 years old. Performed at [facility], [address], [place], [place] [address]     RADIOLOGY STUDIES/RESULTS: DG Chest Port 1V same Day Result Date: 03/05/2023 CLINICAL DATA:  Shortness of breath. EXAM: PORTABLE CHEST 1 VIEW COMPARISON:  03/02/2023 FINDINGS: Right apical pleuroparenchymal opacity with cavitation is similar to prior. Lungs are hyperexpanded with diffuse interstitial coarsening. Airspace opacity in the infrahilar right base is similar to prior with similar patchy scattered bilateral airspace opacities elsewhere. Cardiopericardial silhouette is at upper limits of normal  for size. No acute bony abnormality. Telemetry leads overlie the chest. IMPRESSION: 1. No  significant interval change. 2. Right apical pleuroparenchymal opacity with cavitation is similar to prior. 3. Airspace opacity in the infrahilar right base and scattered in both lungs is similar to prior. Electronically Signed   By: Kennith Center M.D.   On: 03/05/2023 08:10     LOS: 3 days   Jeoffrey Massed, MD  [facility]    To contact the attending provider between 7A-7P or the covering provider during after hours 7P-7A, please log into the web site [email or web address] and access using universal [place] password for that web site. If you do not have the password, please call the hospital operator.  03/05/2023, 10:46 AM

## 2023-03-05 NOTE — Progress Notes (Signed)
OT Cancellation Note  Patient Details Name: Erik [name] MRN: [id number] DOB: [birth year]   Cancelled Treatment:    Reason Eval/Treat Not Completed: Other (comment) (Per RN and MD notes, pt and pt's wife are discussing comfort measures and await a palliative meeting. OT evalution to follow up if appropriate after GOC discussion.)  Donia Pounds 03/05/2023, 12:04 PM

## 2023-03-05 NOTE — Progress Notes (Signed)
Patient ID: Erik [name], male   DOB: [birth year], 59 y.o.   MRN: [id number]    Progress Note from the Palliative Medicine Team at [facility]   Patient Name: Erik [name]        Date: 03/05/2023 DOB: 1965/10/31  Age: 58 y.o. MRN#: [id number] Attending Physician: Maretta Bees, MD Primary Care Physician: Rema Fendt, NP Admit Date: 03/02/2023   Reason for Consultation/Follow-up   Establishing Goals of Care   HPI/ Brief Hospital Review  58 y.o.  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 for pneumonia-presented with shortness of breath-found to have acute on chronic hypoxic respiratory failure secondary to pneumonia due to aspiration through the tracheoesophageal fistula.    Tube feeds have been stopped 2/2 suction thru trach of tube feed formula.  Not a candidate for surgical repair 2/2  to his poor nutritional and functional  status    Initial Palliative care consult 03-02-23 .    Patient and his family face treatment option decisions advanced directive  decisions and anticipatory care needs    Subjective  Extensive chart review has been completed prior to meeting with patient/family  including labs, vital signs, imaging, progress/consult notes, orders, medications and available advance directive documents.   This NP assessed patient at the bedside as a follow up for palliative medicine needs and emotional support.   I met with wife at the bedside,  patient's pastor also at bedside.   Ongoing education offered regarding current medical situation.  Both patient and his wife have had conversation with Dr Jerral Ralph, they understand the seriousness of his situation and the limited options and poor prognosis.  Patient has made decision to shift his focus of care to comfort and dignity, foregoing life prolonging  measures.  With his Renato Gails he is able to express the  comfort his faith gives him, "knowing Jesus".  Plan is to discharge from hospital,  to his mother's house  with hospice services.   Education offered on hospice benefit; philosophy and eligibly.   Plan of care: - DNR/DNI - avoid rehospitalization  - Maintain PEG tube for medications and possible usage into the future - no further tube feeds ( patient is aspirating feeds) - continue Iv fluids until discharge  - discharged home with hospice benefit --focus of care is comfort and dignity allowing for a natural death.  Will write order to offer hospice choice -MOST form completed   Education offered on the natural trajectory and expectations at EOL.     Education offered today regarding  the importance of continued conversation with family and their  medical providers regarding overall plan of care and treatment options,  ensuring decisions are within the context of the patients values and GOCs.  Questions and concerns addressed   Discussed with primary team and nursing staff  PMT will continue to support holistically    Time:  65  minutes   Discussed with Dr Jerral Ralph and treatment team  Detailed review of medical records ( labs, imaging, vital signs), medically appropriate exam ( MS, skin, cardiac,  resp)   discussed with treatment team, counseling and education to patient, family, staff, documenting clinical information, medication management, coordination of care    Lorinda Creed NP  Palliative Medicine Team Team Phone # [phone] Pager [phone]

## 2023-03-05 NOTE — TOC Progression Note (Signed)
Transition of Care [facility]) - Progression Note    Patient Details  Name: Erik [name] MRN: [id number] Date of Birth: 04/07/1965  Transition of Care [facility]) CM/SW Contact  Gordy Clement, RN Phone Number: 03/05/2023, 3:13 PM  Clinical Narrative:     RNCM spoke with spouse. Patient will DC to his Mothers house where patient's Wife and Daughter will care for patient along with Home Hospice. Patient will DC to :   [address] [place], [place]  [address]   Patient will require PTAR transportation to home   [facility] will provide hospice care and arrange all DME with Spouse, [phone] .  Daughter, [name] , can also be reached at [phone].  RNCM will continue to follow for any additional DC needs              Expected Discharge Plan and Services                                               Social Determinants of Health (SDOH) Interventions SDOH Screenings   Food Insecurity: No Food Insecurity (03/02/2023)  Housing: Low Risk  (03/02/2023)  Transportation Needs: No Transportation Needs (03/02/2023)  Utilities: Not At Risk (03/02/2023)  Depression (PHQ2-9): Low Risk  (09/03/2020)  Financial Resource Strain: High Risk (05/06/2020)  Tobacco Use: Medium Risk (03/02/2023)    Readmission Risk Interventions     No data to display

## 2023-03-06 ENCOUNTER — Encounter: Payer: Self-pay | Admitting: Hematology and Oncology

## 2023-03-06 LAB — BASIC METABOLIC PANEL
Anion gap: 11 (ref 5–15)
BUN: 12 mg/dL (ref 6–20)
CO2: 33 mmol/L — ABNORMAL HIGH (ref 22–32)
Calcium: 9.6 mg/dL (ref 8.9–10.3)
Chloride: 95 mmol/L — ABNORMAL LOW (ref 98–111)
Creatinine, Ser: 0.48 mg/dL — ABNORMAL LOW (ref 0.61–1.24)
GFR, Estimated: >60 mL/min (ref >60)
Glucose, Bld: 80 mg/dL (ref 70–99)
Potassium: 3.1 mmol/L — ABNORMAL LOW (ref 3.5–5.1)
Sodium: 139 mmol/L (ref 135–145)

## 2023-03-06 LAB — GLUCOSE, CAPILLARY
Glucose-Capillary: 172 mg/dL — ABNORMAL HIGH (ref 70–99)
Glucose-Capillary: 54 mg/dL — ABNORMAL LOW (ref 70–99)
Glucose-Capillary: 67 mg/dL — ABNORMAL LOW (ref 70–99)
Glucose-Capillary: 69 mg/dL — ABNORMAL LOW (ref 70–99)
Glucose-Capillary: 83 mg/dL (ref 70–99)
Glucose-Capillary: 87 mg/dL (ref 70–99)
Glucose-Capillary: 99 mg/dL (ref 70–99)

## 2023-03-06 LAB — MAGNESIUM: Magnesium: 2.1 mg/dL (ref 1.7–2.4)

## 2023-03-06 LAB — PHOSPHORUS: Phosphorus: 2.7 mg/dL (ref 2.5–4.6)

## 2023-03-06 MED ORDER — DEXTROSE 50 % IV SOLN
1.0000 | INTRAVENOUS | Status: AC
  Administered 2023-03-06: 50 mL via INTRAVENOUS

## 2023-03-06 MED ORDER — FENTANYL 100 MCG/HR TD PT72
1.0000 | MEDICATED_PATCH | TRANSDERMAL | Status: DC
  Administered 2023-03-06: 1 via TRANSDERMAL
  Filled 2023-03-06: qty 1

## 2023-03-06 MED ORDER — POTASSIUM CHLORIDE 20 MEQ PO PACK
40.0000 meq | PACK | Freq: Once | ORAL | Status: AC
  Administered 2023-03-06: 40 meq
  Filled 2023-03-06: qty 2

## 2023-03-06 MED ORDER — DEXTROSE 50 % IV SOLN
INTRAVENOUS | Status: AC
  Filled 2023-03-06: qty 50

## 2023-03-06 MED ORDER — HYDROMORPHONE HCL 2 MG PO TABS
8.0000 mg | ORAL_TABLET | ORAL | Status: DC | PRN
  Administered 2023-03-06: 8 mg
  Filled 2023-03-06: qty 4

## 2023-03-06 NOTE — Progress Notes (Signed)
 PROGRESS NOTE        PATIENT DETAILS Name: Erik [name] Age: 58 y.o. Sex: male Date of Birth: 1965/03/13 Admit Date: 03/02/2023 Admitting Physician Toribio JAYSON Sharps, MD ERE:Duzeyzwd, Amy J, NP  Brief Summary: Patient is a 58 y.o.  male with history of squamous cell carcinoma of the larynx-s/p radical neck resection with total laryngectomy-tracheostomy-with development of tracheoesophageal fistula-recent hospitalization at [facility] for pneumonia-presented with shortness of breath-found to have acute on chronic hypoxic respiratory failure secondary to pneumonia due to aspiration through the tracheoesophageal fistula.  Initially admitted by [facility] and transferred to [facility].    Significant events: 1/31>> admit to ICU by PCCM 2/01>> transferred to [facility] 2/02>> tube feeds when tracheostomy suctioned-worsening hypoxemia-tube feeds stopped.  Suspected aspiration of tube feeds into the lung. . 2/03>> DNR-Home hospice planned on discharge  Significant studies: 1/31>> CXR: Extensive bronchiectasis/bronchitis-and patchy areas of consolidations bilaterally.  Significant microbiology data: 1/31>> COVID/influenza/RSV PCR: Negative 1/31>> blood culture: No growth  Procedures: None  Consults: PCCM Palliative care  Subjective: No complaints overnight-appears comfortable  Objective: Vitals: Blood pressure 110/74, pulse 86, temperature 97.6 F (36.4 C), temperature source Axillary, resp. rate 14, height 5' 9 (1.753 m), weight 41.8 kg, SpO2 94%.   Exam: Comfortable Awake/alert Nonfocal exam  Pertinent Labs/Radiology:    Latest Ref Rng & Units 03/04/2023    5:54 AM 03/02/2023    6:12 AM 07/06/2022    9:30 PM  CBC  WBC 4.0 - 10.5 K/uL 13.0  15.5  6.4   Hemoglobin 13.0 - 17.0 g/dL 8.9  9.6  88.5   Hematocrit 39.0 - 52.0 % 29.1  31.7  35.6   Platelets 150 - 400 K/uL 574  570  326     Lab Results  Component Value Date   NA 139 03/06/2023   K 3.1  (L) 03/06/2023   CL 95 (L) 03/06/2023   CO2 33 (H) 03/06/2023      Assessment/Plan: Severe sepsis secondary to aspiration pneumonia through tracheoesophageal fistula with acute on chronic hypoxic respiratory failure Continuous to require 15 L of oxygen -FiO2 decreased to 70% Suspected aspiration event on 2/2 evening resulting in aspiration of his tube feeds through the fistula into his lungs After extensive discussion with family by this MD and palliative care team-DNR-plan is to discharge home on hospice. Tube feeds continue to be held given tenuous overall respiratory status Empiric cefepime /Flagyl  continue to discharge.  Transaminitis Probably due to low flow state in the setting of severe sepsis LFTs have improved significantly  Hypokalemia/hypophosphatemia/hypomagnesemia Replete K today-Mg/phosphorus levels normal today.  COPD Stable Bronchodilators  Hypothyroidism Synthroid   GERD PPI  Chronic pain Pain continues to be an issue-increase transdermal fentanyl  to 100 mcg-will resume Dilaudid  through PEG tube for moderate pain-only use IV Dilaudid  if he has severe breakthrough pain.  Chronic dysphagia Has G-tube in place-appears to have significant reflux causing aspiration of tube feeds into the lungs via existing tracheoesophageal fistula-he was noted to have tube feeds on tracheal suctioning on 2/2.  Holding G-tube feedings-plan is for hospice home on discharge.  History of tracheoesophageal fistula Reviewed ENT note from recent hospitalization at [facility]-currently not a candidate for surgical repair-his nutritional status has improved significantly for him to tolerate extensive reconstruction surgery.  Per discharge summary-GI/pulmonology unable to offer any other intervention.  Family aware that he will likely continue  to have aspiration of saliva/secretions or G-tube feeds-after extensive discussion-plan is to discharge home with hospice likely tomorrow once  all DME equipments have been delivered.  Laryngeal squamous cell carcinoma-s/p neck dissection/laryngectomy Dysphagia-G-tube in place Chronic hypoxic respiratory failure-has tracheotomy  Underweight: Estimated body mass index is 13.61 kg/m as calculated from the following:   Height as of this encounter: 5' 9 (1.753 m).   Weight as of this encounter: 41.8 kg.   Code status:   Code Status: Do not attempt resuscitation (DNR) - Comfort care   DVT Prophylaxis: enoxaparin  (LOVENOX ) injection 40 mg Start: 03/03/23 0815 SCDs Start: 03/02/23 0804   Family Communication: Spouse-stable-[phone]-bedside on 2/4.   Disposition Plan: Status is: Inpatient Remains inpatient appropriate because: Severity of illness   Planned Discharge Destination:Home health   Diet: Diet Order             Diet NPO time specified  Diet effective now                     Antimicrobial agents: Anti-infectives (From admission, onward)    Start     Dose/Rate Route Frequency Ordered Stop   03/02/23 1900  vancomycin  (VANCOREADY) IVPB 750 mg/150 mL  Status:  Discontinued        750 mg 150 mL/hr over 60 Minutes Intravenous Every 12 hours 03/02/23 1525 03/02/23 1536   03/02/23 1900  vancomycin  (VANCOCIN ) 750 mg in sodium chloride  0.9 % 250 mL IVPB  Status:  Discontinued        750 mg 250 mL/hr over 60 Minutes Intravenous Every 12 hours 03/02/23 1536 03/04/23 0834   03/02/23 1530  ceFEPIme  (MAXIPIME ) 2 g in sodium chloride  0.9 % 100 mL IVPB        2 g 200 mL/hr over 30 Minutes Intravenous Every 8 hours 03/02/23 1525     03/02/23 1515  metroNIDAZOLE  (FLAGYL ) IVPB 500 mg        500 mg 100 mL/hr over 60 Minutes Intravenous Every 12 hours 03/02/23 1504     03/02/23 0630  vancomycin  (VANCOCIN ) IVPB 1000 mg/200 mL premix  Status:  Discontinued        1,000 mg 200 mL/hr over 60 Minutes Intravenous  Once 03/02/23 0619 03/02/23 0626   03/02/23 0630  ceFEPIme  (MAXIPIME ) 2 g in sodium chloride  0.9 % 100 mL  IVPB        2 g 200 mL/hr over 30 Minutes Intravenous  Once 03/02/23 0619 03/02/23 0700   03/02/23 0630  metroNIDAZOLE  (FLAGYL ) IVPB 500 mg        500 mg 100 mL/hr over 60 Minutes Intravenous  Once 03/02/23 0619 03/02/23 0734   03/02/23 0630  vancomycin  (VANCOREADY) IVPB 1250 mg/250 mL        1,250 mg 166.7 mL/hr over 90 Minutes Intravenous  Once 03/02/23 9372 03/02/23 0820        MEDICATIONS: Scheduled Meds:  calcitRIOL   0.5 mcg Per Tube BID   dextrose        enoxaparin  (LOVENOX ) injection  40 mg Subcutaneous Q24H   feeding supplement (PROSource TF20)  60 mL Per Tube Daily   fentaNYL   1 patch Transdermal Q72H   free water   150 mL Per Tube Q4H   levothyroxine   250 mcg Per J Tube Q0600   midodrine   10 mg Per Tube TID WC   mouth rinse  15 mL Mouth Rinse 4 times per day   pantoprazole  (PROTONIX ) IV  40 mg Intravenous Q12H   thiamine   100 mg Per Tube Daily   Continuous Infusions:  ceFEPime  (MAXIPIME ) IV 2 g (03/06/23 0855)   feeding supplement (OSMOLITE 1.5 CAL) Stopped (03/04/23 2300)   metronidazole  500 mg (03/06/23 0354)   PRN Meds:.acetaminophen  **OR** acetaminophen , dextrose , HYDROmorphone  (DILAUDID ) injection, HYDROmorphone , mouth rinse   I have personally reviewed following labs and imaging studies  LABORATORY DATA: CBC: Recent Labs  Lab 03/02/23 0612 03/04/23 0554  WBC 15.5* 13.0*  NEUTROABS 14.9* 12.0*  HGB 9.6* 8.9*  HCT 31.7* 29.1*  MCV 91.4 90.1  PLT 570* 574*    Basic Metabolic Panel: Recent Labs  Lab 03/02/23 0612 03/02/23 1916 03/03/23 0651 03/04/23 0554 03/05/23 0423 03/05/23 1234 03/06/23 0443  NA 141  --   --  142 140 140 139  K 3.6  --   --  3.2* 2.5* 3.3* 3.1*  CL 99  --   --  103 96* 99 95*  CO2 33*  --   --  33* 32 31 33*  GLUCOSE 96  --   --  108* 87 95 80  BUN 27*  --   --  33* 20 17 12   CREATININE 0.63  --   --  0.54* 0.47* 0.65 0.48*  CALCIUM  9.4  --   --  9.4 9.6 9.5 9.6  MG  --  1.6* 1.8 1.9 1.9  --  2.1  PHOS  --  4.3 5.8*  3.5 2.2*  --  2.7    GFR: Estimated Creatinine Clearance: 59.5 mL/min (A) (by C-G formula based on SCr of 0.48 mg/dL (L)).  Liver Function Tests: Recent Labs  Lab 03/02/23 0612 03/03/23 0651 03/04/23 0554 03/05/23 0423  AST 139* 30 21 19   ALT 151* 77* 57* 46*  ALKPHOS 263* 173* 142* 139*  BILITOT 0.9 0.8 0.5 0.6  PROT 6.6 5.8* 5.7* 6.1*  ALBUMIN 2.0* 1.6* 1.7* 1.9*   No results for input(s): LIPASE, AMYLASE in the last 168 hours. No results for input(s): AMMONIA in the last 168 hours.  Coagulation Profile: Recent Labs  Lab 03/02/23 0612  INR 1.5*    Cardiac Enzymes: No results for input(s): CKTOTAL, CKMB, CKMBINDEX, TROPONINI in the last 168 hours.  BNP (last 3 results) No results for input(s): PROBNP in the last 8760 hours.  Lipid Profile: No results for input(s): CHOL, HDL, LDLCALC, TRIG, CHOLHDL, LDLDIRECT in the last 72 hours.  Thyroid  Function Tests: No results for input(s): TSH, T4TOTAL, FREET4, T3FREE, THYROIDAB in the last 72 hours.  Anemia Panel: No results for input(s): VITAMINB12, FOLATE, FERRITIN, TIBC, IRON, RETICCTPCT in the last 72 hours.  Urine analysis: No results found for: COLORURINE, APPEARANCEUR, LABSPEC, PHURINE, GLUCOSEU, HGBUR, BILIRUBINUR, KETONESUR, PROTEINUR, UROBILINOGEN, NITRITE, LEUKOCYTESUR  Sepsis Labs: Lactic Acid, Venous    Component Value Date/Time   LATICACIDVEN 1.2 03/03/2023 1426    MICROBIOLOGY: Recent Results (from the past 240 hours)  Resp panel by RT-PCR (RSV, Flu A&B, Covid) Anterior Nasal Swab     Status: None   Collection Time: 03/02/23  5:36 AM   Specimen: Anterior Nasal Swab  Result Value Ref Range Status   SARS Coronavirus 2 by RT PCR NEGATIVE NEGATIVE Final   Influenza A by PCR NEGATIVE NEGATIVE Final   Influenza B by PCR NEGATIVE NEGATIVE Final    Comment: (NOTE) The Xpert Xpress SARS-CoV-2/FLU/RSV plus assay is intended as an  aid in the diagnosis of influenza from Nasopharyngeal swab specimens and should not be used as a sole basis for treatment. Nasal washings and aspirates are  unacceptable for Xpert Xpress SARS-CoV-2/FLU/RSV testing.  Fact Sheet for Patients: bloggercourse.com  Fact Sheet for Healthcare Providers: seriousbroker.it  This test is not yet approved or cleared by the [place]  FDA and has been authorized for detection and/or diagnosis of SARS-CoV-2 by FDA under an Emergency Use Authorization (EUA). This EUA will remain in effect (meaning this test can be used) for the duration of the COVID-19 declaration under Section 564(b)(1) of the Act, 21 U.S.C. section 360bbb-3(b)(1), unless the authorization is terminated or revoked.     Resp Syncytial Virus by PCR NEGATIVE NEGATIVE Final    Comment: (NOTE) Fact Sheet for Patients: bloggercourse.com  Fact Sheet for Healthcare Providers: seriousbroker.it  This test is not yet approved or cleared by the [place]  FDA and has been authorized for detection and/or diagnosis of SARS-CoV-2 by FDA under an Emergency Use Authorization (EUA). This EUA will remain in effect (meaning this test can be used) for the duration of the COVID-19 declaration under Section 564(b)(1) of the Act, 21 U.S.C. section 360bbb-3(b)(1), unless the authorization is terminated or revoked.  Performed at [facility], [address], [place], [place] [address]   Blood Culture (routine x 2)     Status: None (Preliminary result)   Collection Time: 03/02/23  6:09 AM   Specimen: BLOOD  Result Value Ref Range Status   Specimen Description BLOOD RIGHT ANTECUBITAL  Final   Special Requests   Final    BOTTLES DRAWN AEROBIC AND ANAEROBIC Blood Culture adequate volume   Culture   Final    NO GROWTH 4 DAYS Performed at [facility], [address],  [place], [place] [address]    Report Status PENDING  Incomplete  Blood Culture (routine x 2)     Status: None (Preliminary result)   Collection Time: 03/02/23  6:12 AM   Specimen: BLOOD RIGHT HAND  Result Value Ref Range Status   Specimen Description BLOOD RIGHT HAND  Final   Special Requests   Final    BOTTLES DRAWN AEROBIC ONLY Blood Culture adequate volume   Culture   Final    NO GROWTH 4 DAYS Performed at [facility], [address] [place], [place] [address]    Report Status PENDING  Incomplete  MRSA Next Gen by PCR, Nasal     Status: None   Collection Time: 03/03/23  4:35 PM   Specimen: Nasal Mucosa; Nasal Swab  Result Value Ref Range Status   MRSA by PCR Next Gen NOT DETECTED NOT DETECTED Final    Comment: (NOTE) The GeneXpert MRSA Assay (FDA approved for NASAL specimens only), is one component of a comprehensive MRSA colonization surveillance program. It is not intended to diagnose MRSA infection nor to guide or monitor treatment for MRSA infections. Test performance is not FDA approved in patients less than 10 years old. Performed at [facility], [address], [place], [place] [address]     RADIOLOGY STUDIES/RESULTS: DG Chest Port 1V same Day Result Date: 03/05/2023 CLINICAL DATA:  Shortness of breath. EXAM: PORTABLE CHEST 1 VIEW COMPARISON:  03/02/2023 FINDINGS: Right apical pleuroparenchymal opacity with cavitation is similar to prior. Lungs are hyperexpanded with diffuse interstitial coarsening. Airspace opacity in the infrahilar right base is similar to prior with similar patchy scattered bilateral airspace opacities elsewhere. Cardiopericardial silhouette is at upper limits of normal for size. No acute bony abnormality. Telemetry leads overlie the chest. IMPRESSION: 1. No significant interval change. 2. Right apical pleuroparenchymal opacity with  cavitation is similar to prior. 3. Airspace opacity in the infrahilar right base and scattered in both lungs is  similar to prior. Electronically Signed   By: Camellia Candle M.D.   On: 03/05/2023 08:10     LOS: 4 days   Donalda Applebaum, MD  [facility]    To contact the attending provider between 7A-7P or the covering provider during after hours 7P-7A, please log into the web site [email or web address] and access using universal [place] password for that web site. If you do not have the password, please call the hospital operator.  03/06/2023, 11:33 AM

## 2023-03-06 NOTE — Plan of Care (Signed)
  Problem: Clinical Measurements: Goal: Will remain free from infection Outcome: Progressing Goal: Cardiovascular complication will be avoided Outcome: Progressing   Problem: Elimination: Goal: Will not experience complications related to bowel motility Outcome: Progressing Goal: Will not experience complications related to urinary retention Outcome: Progressing   Problem: Education: Goal: Knowledge of General Education information will improve Description: Including pain rating scale, medication(s)/side effects and non-pharmacologic comfort measures Outcome: Not Progressing   Problem: Health Behavior/Discharge Planning: Goal: Ability to manage health-related needs will improve Outcome: Not Progressing   Problem: Clinical Measurements: Goal: Ability to maintain clinical measurements within normal limits will improve Outcome: Not Progressing Goal: Diagnostic test results will improve Outcome: Not Progressing Goal: Respiratory complications will improve Outcome: Not Progressing   Problem: Activity: Goal: Risk for activity intolerance will decrease Outcome: Not Progressing   Problem: Nutrition: Goal: Adequate nutrition will be maintained Outcome: Not Progressing   Problem: Coping: Goal: Level of anxiety will decrease Outcome: Not Progressing   Problem: Pain Managment: Goal: General experience of comfort will improve and/or be controlled Outcome: Not Progressing   Problem: Safety: Goal: Ability to remain free from injury will improve Outcome: Not Progressing   Problem: Skin Integrity: Goal: Risk for impaired skin integrity will decrease Outcome: Not Progressing

## 2023-03-06 NOTE — Progress Notes (Signed)
 Patient ID: Erik [name], male   DOB: February 26, 1965, 58 y.o.   MRN: [id number]    Progress Note from the Palliative Medicine Team at [facility]   Patient Name: Erik [name]        Date: 03/06/2023 DOB: 07/11/1965  Age: 58 y.o. MRN#: [id number] Attending Physician: Raenelle Donalda HERO, MD Primary Care Physician: Lorren Greig PARAS, NP Admit Date: 03/02/2023   Reason for Consultation/Follow-up   Establishing Goals of Care   HPI/ Brief Hospital Review  58 y.o.  male with history of squamous cell carcinoma of the larynx-s/p radical neck resection with total laryngectomy-tracheostomy-with development of tracheoesophageal fistula-recent hospitalization at [facility] for pneumonia-presented with shortness of breath-found to have acute on chronic hypoxic respiratory failure secondary to pneumonia due to aspiration through the tracheoesophageal fistula.    Tube feeds have been stopped 2/2 suction thru trach of tube feed formula.  Not a candidate for surgical repair 2/2  to his poor nutritional and functional  status    Initial Palliative care consult 03-02-23 .    Patient and his family face treatment option decisions advanced directive  decisions and anticipatory care needs    Subjective  Extensive chart review has been completed prior to meeting with patient/family  including labs, vital signs, imaging, progress/consult notes, orders, medications and available advance directive documents.   This NP assessed patient at the bedside as a follow up for palliative medicine needs and emotional support.   Wife at the bedside.    Both patient and his wife are comfortable with decision made yesterday to shift to a comfort path discharging home with hospice.  Patient is comfortable with current pain management regime.  They have spoken with [facility] hospice, are awaiting DME and anxious to get home as soon as possible  Plan is to discharge from hospital,  to his mother's house  with hospice  services.   Education offered on hospice benefit; philosophy and eligibly.   Plan of care: - DNR/DNI - avoid rehospitalization  - Maintain PEG tube for medications and possible usage into the future -Symptom management              -Dilaudid  8 mg tablets per tube every 4 hours as needed for pain              -Duragesic  patch 100 mcg/hr as directed - no further tube feeds ( patient is aspirating feeds) - continue Iv fluids until discharge  - discharged home with hospice benefit --focus of care is comfort and dignity allowing for a natural death.  Will write order to offer hospice choice -MOST form completed   Education offered on the natural trajectory and expectations at EOL.     Education offered today regarding  the importance of continued conversation with family and their  medical providers regarding overall plan of care and treatment options,  ensuring decisions are within the context of the patients values and GOCs.  Questions and concerns addressed   Discussed with primary team and nursing staff  PMT will continue to support holistically    Time:  35  minutes   Discussed with Dr Raenelle and treatment team  Detailed review of medical records ( labs, imaging, vital signs), medically appropriate exam ( MS, skin, cardiac,  resp)   discussed with treatment team, counseling and education to patient, family, staff, documenting clinical information, medication management, coordination of care    Ronal Plants NP  Palliative Medicine Team Team Phone # [phone] Pager [phone]

## 2023-03-06 NOTE — Progress Notes (Signed)
 PT Cancellation Note  Patient Details Name: Erik [name] MRN: [id number] DOB: January 07, 1966   Cancelled Treatment:    Reason Eval/Treat Not Completed: (P) Other (comment) (GOC are now comfort care, so PT will sign off. Please reconsult if new needs arise.)   Darryle George 03/06/2023, 2:51 PM

## 2023-03-06 NOTE — Progress Notes (Signed)
 Nutrition Follow-up  DOCUMENTATION CODES:   Not applicable  INTERVENTION:  Advance TF as medically applicable:   Osmolite 1.5  at 45 ml/h (1080 ml per day) Once current feeding of pivot depleted change to Osmolite 1.5 starting @ 23ml/hr)  FWF; every 4 hours. Prosource TF20 60 ml daily   Provides 1700 kcal, 88 gm protein, 1723 ml free water  daily     NUTRITION DIAGNOSIS:   Increased nutrient needs related to chronic illness as evidenced by NPO status, estimated needs.    GOAL:   Patient will meet greater than or equal to 90% of their needs    MONITOR:   TF tolerance  REASON FOR ASSESSMENT:   Consult Enteral/tube feeding initiation and management  ASSESSMENT:   58 y.o.M, presented to ED 1/31 with complaints of SOB. Recently admitted to [facility] for pneumonia. Admitted with principal problem of acute on chronic respiratory failure.  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  Patient to transition to Hospice care at discharge. PEG in place MD reports, Tube feeds continue to be held given tenuous overall respiratory status.  Per PMT patient has made decision to shift focus of care to Comfort and dignity, foregoing life prolonging measures.  NUTRITION - FOCUSED PHYSICAL EXAM:    Diet Order:   Diet Order             Diet NPO time specified  Diet effective now                   EDUCATION NEEDS:   Education needs have been addressed  Skin:  Skin Assessment: Reviewed RN Assessment  Last BM:  PTA  Height:   Ht Readings from Last 1 Encounters:  03/02/23 5' 9 (1.753 m)    Weight:   Wt Readings from Last 1 Encounters:  03/06/23 41.8 kg    Ideal Body Weight:     BMI:  Body mass index is 13.61 kg/m.  Estimated Nutritional Needs:   Kcal:  1650-1925  kcal  Protein:  75- 90 g  Fluid:  81ml/kcal    Jenna Pew RDN, LDN Clinical Dietitian   If unable to reach, please contact RD Inpatient secure chat group between 8 am-4 pm daily

## 2023-03-06 NOTE — Progress Notes (Signed)
 OT Cancellation Note  Patient Details Name: Erik [name] MRN: [id number] DOB: April 25, 1965   Cancelled Treatment:    Reason Eval/Treat Not Completed: OT screened, no needs identified, will sign off (Per Palliative note, pt's GOC shifted to comfort measures with plans to discharge home with support of family and hospice care.)  [name] 03/06/2023, 8:05 AM

## 2023-03-06 NOTE — Progress Notes (Signed)
 [facility] [phone] [facility] Liaison Note  Received request from [facility] for hospice services at home after discharge. Spoke with patient and wife, Erik [name] to initiate education related to hospice philosophy, services and team approach to care. Wife verbalized understanding of information given. Per discussion, the plan is for discharge home in a couple of days and after DME is delivered .  DME needs discussed. Patient has the following equipment in the home: O2 concentrator, BSC and walker. Family requests the following equipment for delivery: hospital bed   Please send signed and completed DNR home with patient/family. Please provide prescriptions at discharge as needed to ensure ongoing symptom management.  [facility] information and contact numbers given to Erik [name]. Please call with any concerns.  Thank you for the opportunity to participate in this patient's care.   Eleanor Nail, LPN [facility] Liaison [phone]

## 2023-03-07 ENCOUNTER — Other Ambulatory Visit (HOSPITAL_COMMUNITY): Payer: Self-pay

## 2023-03-07 LAB — GLUCOSE, CAPILLARY
Glucose-Capillary: 120 mg/dL — ABNORMAL HIGH (ref 70–99)
Glucose-Capillary: 134 mg/dL — ABNORMAL HIGH (ref 70–99)
Glucose-Capillary: 80 mg/dL (ref 70–99)

## 2023-03-07 LAB — CULTURE, BLOOD (ROUTINE X 2)
Culture: NO GROWTH
Culture: NO GROWTH
Special Requests: ADEQUATE
Special Requests: ADEQUATE

## 2023-03-07 MED ORDER — FREE WATER: 150.0000 mL | Status: AC

## 2023-03-07 MED ORDER — HYDROMORPHONE HCL 8 MG PO TABS
8.0000 mg | ORAL_TABLET | ORAL | 0 refills | Status: DC | PRN
  Filled 2023-03-07: qty 30, 5d supply, fill #0

## 2023-03-07 MED ORDER — DEXTROSE IN LACTATED RINGERS 5 % IV SOLN: INTRAVENOUS | Status: DC

## 2023-03-07 MED ORDER — FENTANYL 100 MCG/HR TD PT72: 1.0000 | MEDICATED_PATCH | TRANSDERMAL | 0 refills | Status: AC

## 2023-03-07 MED ORDER — DEXTROSE 50 % IV SOLN
INTRAVENOUS | Status: AC
  Administered 2023-03-07: 12.5 g via INTRAVENOUS
  Filled 2023-03-07: qty 50

## 2023-03-07 MED ORDER — HYDROMORPHONE HCL 8 MG PO TABS: 8.0000 mg | ORAL_TABLET | ORAL | 0 refills | Status: AC | PRN

## 2023-03-07 MED ORDER — FENTANYL 100 MCG/HR TD PT72
1.0000 | MEDICATED_PATCH | TRANSDERMAL | 0 refills | Status: DC
  Filled 2023-03-07: qty 5, 15d supply, fill #0

## 2023-03-07 MED ORDER — DEXTROSE 50 % IV SOLN: 12.5000 g | INTRAVENOUS | Status: AC

## 2023-03-07 NOTE — Progress Notes (Addendum)
 Persistent hypoglycemia RN reported that overnight patient patient had multiple episodes of hypoglycemia.  Currently tube feed on hold as recurrent aspiration and food coming through the tracheal ostomy site as patient has tracheoesophageal fistula. Currently patient is on comfort care. - Starting D5 LR 50 cc/h. - Continue hypoglycemia protocol and D50 amp as needed for blood glucose below 70.

## 2023-03-07 NOTE — TOC Transition Note (Signed)
 Transition of Care [facility]) - Discharge Note   Patient Details  Name: Erik [name] MRN: [id number] Date of Birth: [birth year]  Transition of [facility]) CM/SW Contact:  Hendricks KANDICE Her, RN Phone Number: 03/07/2023, 9:20 AM   Clinical Narrative:     Patient will DC to home today with home hospice care provided by [facility]. All DME has been delivered to the home per Wife. PTAR has been notified .            Patient Goals and CMS Choice            Discharge Placement                       Discharge Plan and Services Additional resources added to the After Visit Summary for                                       Social Drivers of Health (SDOH) Interventions SDOH Screenings   Food Insecurity: No Food Insecurity (03/02/2023)  Housing: Low Risk  (03/02/2023)  Transportation Needs: No Transportation Needs (03/02/2023)  Utilities: Not At Risk (03/02/2023)  Depression (PHQ2-9): Low Risk  (09/03/2020)  Financial Resource Strain: High Risk (05/06/2020)  Tobacco Use: Medium Risk (03/02/2023)     Readmission Risk Interventions     No data to display

## 2023-03-07 NOTE — Plan of Care (Signed)

## 2023-03-07 NOTE — Plan of Care (Signed)

## 2023-03-07 NOTE — Discharge Summary (Signed)
 PATIENT DETAILS Name: Erik [name] Age: 58 y.o. Sex: male Date of Birth: 01-03-1966 MRN: [id number]. Admitting Physician: Toribio JAYSON Sharps, MD ERE:Duzeyzwd, Greig PARAS, NP  Admit Date: 03/02/2023 Discharge date: 03/07/2023  Recommendations for Outpatient Follow-up:  Optimize comfort measures  Admitted From:  Home  Disposition: Hospice care   Discharge Condition: poor  CODE STATUS:   Code Status: Do not attempt resuscitation (DNR) - Comfort care   Diet recommendation:  Diet Order             Diet - low sodium heart healthy           Diet NPO time specified  Diet effective now                    Brief Summary: Patient is a 58 y.o.  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  Initially admitted by [facility] and transferred to [facility].     Significant events: 1/31>> admit to ICU by PCCM 2/01>> transferred to [facility] 2/02>> tube feeds when tracheostomy suctioned-worsening hypoxemia requiring 100% oxygen -tube feeds stopped.  Suspected aspiration of tube feeds into the lung. . 2/03>> DNR-Home hospice planned on discharge   Significant studies: 1/31>> CXR: Extensive bronchiectasis/bronchitis-and patchy areas of consolidations bilaterally.   Significant microbiology data: 1/31>> COVID/influenza/RSV PCR: Negative 1/31>> blood culture: No growth   Procedures: None   Consults: PCCM Palliative care  Brief Hospital Course: Severe sepsis secondary to aspiration pneumonia through tracheoesophageal fistula with acute on chronic hypoxic respiratory failure Sepsis physiology stable-but developed worsening hypoxemia-primarily  due to aspiration of secretions and also tube feeds through his tracheoesophageal fistula into his lungs.  Maintained on broad-spectrum antibiotics-without significant improvement.  Extensive discussion with family by this MD and by palliative care team-plans are now to transition to full comfort measures-family elects to take patient home with hospice care.  Tube feeds have been held-PEG tube remains in place for medications and for occasional comfort feeds via tube.  No role to continue antibiotics on discharge.     Transaminitis Probably due to low flow state in the setting of severe sepsis LFTs have improved significantly   Hypokalemia/hypophosphatemia/hypomagnesemia Repleted   COPD Stable Bronchodilators   Hypothyroidism Synthroid    GERD PPI   Chronic pain Pain continues to be an issue-was all on narcotics at home-transdermal fentanyl  increased to 100 mcg-Dilaudid  resumed through PEG tube.   Chronic dysphagia Has G-tube in place-appears to have significant reflux causing aspiration of tube feeds into the lungs via existing tracheoesophageal fistula-he was noted to have tube feeds on tracheal suctioning on 2/2.  Since patient will be under hospice care-okay to use G-tube for medications-and for occasional minimal feeds for comfort.   History of tracheoesophageal fistula Reviewed ENT note from recent hospitalization at [facility]-currently not a candidate for surgical repair-his nutritional status has improved significantly for him to tolerate extensive reconstruction surgery.  Per discharge summary-GI/pulmonology unable to offer any other intervention.  Family aware that he will likely continue to have aspiration of saliva/secretions or G-tube feeds-after extensive discussion-plan is to discharge home with hospice    Laryngeal squamous cell carcinoma-s/p neck dissection/laryngectomy Dysphagia-G-tube in place Chronic hypoxic respiratory failure-has tracheotomy    Underweight: Estimated body mass index is 13.61 kg/m as  calculated from the following:   Height as of this encounter: 5' 9 (1.753 m).   Weight as of this encounter: 41.8 kg.   Discharge Diagnoses:  Principal Problem:   Acute on chronic respiratory failure [facility])   Discharge Instructions:  Activity:  As tolerated with Full fall precautions use walker/cane & assistance as needed  Discharge Instructions     Diet - low sodium heart healthy   Complete by: As directed    Discharge instructions   Complete by: As directed    Follow with Primary MD  Lorren Greig PARAS, NP in 1-2 weeks  Please get a complete blood count and chemistry panel checked by your Primary MD at your next visit, and again as instructed by your Primary MD.  Get Medicines reviewed and adjusted: Please take all your medications with you for your next visit with your Primary MD  Laboratory/radiological data: Please request your Primary MD to go over all hospital tests and procedure/radiological results at the follow up, please ask your Primary MD to get all Hospital records sent to his/her office.  In some cases, they will be blood work, cultures and biopsy results pending at the time of your discharge. Please request that your primary care M.D. follows up on these results.  Also Note the following: If you experience worsening of your admission symptoms, develop shortness of breath, life threatening emergency, suicidal or homicidal thoughts you must seek medical attention immediately by calling 911 or calling your MD immediately  if symptoms less severe.  You must read complete instructions/literature along with all the possible adverse reactions/side effects for all the Medicines you take and that have been prescribed to you. Take any new Medicines after you have completely understood and accpet all the possible adverse reactions/side effects.   Do not drive when taking Pain medications or sleeping medications  (Benzodaizepines)  Do not take more than prescribed Pain, Sleep and Anxiety Medications. It is not advisable to combine anxiety,sleep and pain medications without talking with your primary care practitioner  Special Instructions: If you have smoked or chewed Tobacco  in the last 2 yrs please stop smoking, stop any regular Alcohol  and or any Recreational drug use.  Wear Seat belts while driving.  Please note: You were cared for by a hospitalist during your hospital stay. Once you are discharged, your primary care physician will handle any further medical issues. Please note that NO REFILLS for any discharge medications will be authorized once you are discharged, as it is imperative that you return to your primary care physician (or establish a relationship with a primary care physician if you do not have one) for your post hospital discharge needs so that they can reassess your need for medications and monitor your lab values.   Discharge wound care:   Complete by: As directed    Silicone foam dressings to the sacrum, change every 3 days. ASSESS UNDER dressings each shift for any acute changes in the wounds.   Increase activity slowly   Complete by: As directed       Allergies as of 03/07/2023   No Known Allergies      Medication List     STOP taking these medications    fentaNYL  75 MCG/HR Commonly known as: DURAGESIC  Replaced by: fentaNYL  100 MCG/HR   minocycline 100 MG capsule Commonly known as: MINOCIN   Oxycodone  HCl 10 MG Tabs       TAKE these medications    calcitRIOL  0.5 MCG capsule  Commonly known as: ROCALTROL  Place 0.5 mcg into feeding tube 2 (two) times daily.   feeding supplement (OSMOLITE 1.5 CAL) Liqd Give 7 cartons of Osmolite 1.5 daily via PEG split into 4 feedings.  Give 60 mL free water  flushes before and after bolus feedings with an additional 16 ounces of water  via PEG or mouth as tolerated.  This provides 2485 cal and 104.3 g of protein meeting 100%  calorie and protein needs.   fentaNYL  100 MCG/HR Commonly known as: DURAGESIC  Place 1 patch onto the skin every 3 (three) days. Start taking on: March 09, 2023 Replaces: fentaNYL  75 MCG/HR   free water  Soln Place 150 mLs into feeding tube every 4 (four) hours.   HYDROmorphone  8 MG tablet Commonly known as: DILAUDID  Place 1 tablet (8 mg total) into feeding tube every 4 (four) hours as needed for severe pain (pain score 7-10).   levothyroxine  112 MCG tablet Commonly known as: SYNTHROID  Place 1 tablet into feeding tube daily.               Discharge Care Instructions  (From admission, onward)           Start     Ordered   03/07/23 0000  Discharge wound care:       Comments: Silicone foam dressings to the sacrum, change every 3 days. ASSESS UNDER dressings each shift for any acute changes in the wounds.   03/07/23 0856            Follow-up Information     Lorren Greig PARAS, NP. Schedule an appointment as soon as possible for a visit.   Specialty: Nurse Practitioner Why: As needed Contact information: [address] [place] [address] [phone]                No Known Allergies   Other Procedures/Studies: DG Chest Port 1V same Day Result Date: 03/05/2023 CLINICAL DATA:  Shortness of breath. EXAM: PORTABLE CHEST 1 VIEW COMPARISON:  03/02/2023 FINDINGS: Right apical pleuroparenchymal opacity with cavitation is similar to prior. Lungs are hyperexpanded with diffuse interstitial coarsening. Airspace opacity in the infrahilar right base is similar to prior with similar patchy scattered bilateral airspace opacities elsewhere. Cardiopericardial silhouette is at upper limits of normal for size. No acute bony abnormality. Telemetry leads overlie the chest. IMPRESSION: 1. No significant interval change. 2. Right apical pleuroparenchymal opacity with cavitation is similar to prior. 3. Airspace opacity in the infrahilar right base and scattered in  both lungs is similar to prior. Electronically Signed   By: Camellia Candle M.D.   On: 03/05/2023 08:10   DG Chest Portable 1 View Result Date: 03/02/2023 CLINICAL DATA:  58 year old male with history of shortness of breath. EXAM: PORTABLE CHEST 1 VIEW COMPARISON:  Chest x-ray 08/18/2020. FINDINGS: Diffuse interstitial prominence, widespread peribronchial cuffing and extensive reticulonodular opacities are noted throughout the lungs bilaterally (right greater than left), corresponding to areas of tree-in-bud nodularity, areas of airspace consolidation and widespread bronchiectasis noted on recent chest CT 02/20/2023. The most confluent consolidation persists in the right lower lobe. Extensive fibro cavitary changes are also noted in the apex of the right upper lobe. Trace right pleural effusion. No left pleural effusion. No definite pneumothorax. No evidence of pulmonary edema. Heart size is normal. Numerous surgical clips are noted at the level of the thoracic inlet from prior laryngectomy. IMPRESSION: 1. Extensive bronchiectasis, bronchiolitis, bronchitis, and areas of airspace consolidation throughout the lungs bilaterally (right greater than left), corresponding to findings  of extensive atypical infection on recent chest CT 02/20/2023. 2. Trace right pleural effusion. Electronically Signed   By: Toribio Aye M.D.   On: 03/02/2023 06:18     TODAY-DAY OF DISCHARGE:  Subjective:   Bear Osten today has no headache,no chest abdominal pain,no new weakness tingling or numbness  Objective:   Blood pressure 99/66, pulse 80, temperature 97.6 F (36.4 C), temperature source Axillary, resp. rate (!) 9, height 5' 9 (1.753 m), weight 41.8 kg, SpO2 96%.  Intake/Output Summary (Last 24 hours) at 03/07/2023 0904 Last data filed at 03/06/2023 2311 Gross per 24 hour  Intake --  Output 600 ml  Net -600 ml   Filed Weights   03/02/23 0514 03/05/23 0500 03/06/23 0500  Weight: 54.4 kg 41 kg 41.8 kg     Exam: Awake Alert, Oriented *3, No new F.N deficits, Normal affect .AT,PERRAL Supple Neck,No JVD, No cervical lymphadenopathy appriciated.  Symmetrical Chest wall movement, Good air movement bilaterally, CTAB RRR,No Gallops,Rubs or new Murmurs, No Parasternal Heave +ve B.Sounds, Abd Soft, Non tender, No organomegaly appriciated, No rebound -guarding or rigidity. No Cyanosis, Clubbing or edema, No new Rash or bruise   PERTINENT RADIOLOGIC STUDIES: No results found.   PERTINENT LAB RESULTS: CBC: No results for input(s): WBC, HGB, HCT, PLT in the last 72 hours. CMET CMP     Component Value Date/Time   NA 139 03/06/2023 0443   K 3.1 (L) 03/06/2023 0443   CL 95 (L) 03/06/2023 0443   CO2 33 (H) 03/06/2023 0443   GLUCOSE 80 03/06/2023 0443   BUN 12 03/06/2023 0443   CREATININE 0.48 (L) 03/06/2023 0443   CREATININE 1.06 10/26/2021 1312   CALCIUM  9.6 03/06/2023 0443   PROT 6.1 (L) 03/05/2023 0423   ALBUMIN 1.9 (L) 03/05/2023 0423   AST 19 03/05/2023 0423   AST 19 10/26/2021 1312   ALT 46 (H) 03/05/2023 0423   ALT 13 10/26/2021 1312   ALKPHOS 139 (H) 03/05/2023 0423   BILITOT 0.6 03/05/2023 0423   BILITOT 0.4 10/26/2021 1312   GFRNONAA >60 03/06/2023 0443   GFRNONAA >60 10/26/2021 1312    GFR Estimated Creatinine Clearance: 59.5 mL/min (A) (by C-G formula based on SCr of 0.48 mg/dL (L)). No results for input(s): LIPASE, AMYLASE in the last 72 hours. No results for input(s): CKTOTAL, CKMB, CKMBINDEX, TROPONINI in the last 72 hours. Invalid input(s): POCBNP No results for input(s): DDIMER in the last 72 hours. No results for input(s): HGBA1C in the last 72 hours. No results for input(s): CHOL, HDL, LDLCALC, TRIG, CHOLHDL, LDLDIRECT in the last 72 hours. No results for input(s): TSH, T4TOTAL, T3FREE, THYROIDAB in the last 72 hours.  Invalid input(s): FREET3 No results for input(s): VITAMINB12, FOLATE, FERRITIN,  TIBC, IRON, RETICCTPCT in the last 72 hours. Coags: No results for input(s): INR in the last 72 hours.  Invalid input(s): PT Microbiology: Recent Results (from the past 240 hours)  Resp panel by RT-PCR (RSV, Flu A&B, Covid) Anterior Nasal Swab     Status: None   Collection Time: 03/02/23  5:36 AM   Specimen: Anterior Nasal Swab  Result Value Ref Range Status   SARS Coronavirus 2 by RT PCR NEGATIVE NEGATIVE Final   Influenza A by PCR NEGATIVE NEGATIVE Final   Influenza B by PCR NEGATIVE NEGATIVE Final    Comment: (NOTE) The Xpert Xpress SARS-CoV-2/FLU/RSV plus assay is intended as an aid in the diagnosis of influenza from Nasopharyngeal swab specimens and should not be used as a  sole basis for treatment. Nasal washings and aspirates are unacceptable for Xpert Xpress SARS-CoV-2/FLU/RSV testing.  Fact Sheet for Patients: bloggercourse.com  Fact Sheet for Healthcare Providers: seriousbroker.it  This test is not yet approved or cleared by the [place]  FDA and has been authorized for detection and/or diagnosis of SARS-CoV-2 by FDA under an Emergency Use Authorization (EUA). This EUA will remain in effect (meaning this test can be used) for the duration of the COVID-19 declaration under Section 564(b)(1) of the Act, 21 U.S.C. section 360bbb-3(b)(1), unless the authorization is terminated or revoked.     Resp Syncytial Virus by PCR NEGATIVE NEGATIVE Final    Comment: (NOTE) Fact Sheet for Patients: bloggercourse.com  Fact Sheet for Healthcare Providers: seriousbroker.it  This test is not yet approved or cleared by the [place]  FDA and has been authorized for detection and/or diagnosis of SARS-CoV-2 by FDA under an Emergency Use Authorization (EUA). This EUA will remain in effect (meaning this test can be used) for the duration of the COVID-19 declaration under  Section 564(b)(1) of the Act, 21 U.S.C. section 360bbb-3(b)(1), unless the authorization is terminated or revoked.  Performed at [facility], [address], [place], [place] [address]   Blood Culture (routine x 2)     Status: None   Collection Time: 03/02/23  6:09 AM   Specimen: BLOOD  Result Value Ref Range Status   Specimen Description BLOOD RIGHT ANTECUBITAL  Final   Special Requests   Final    BOTTLES DRAWN AEROBIC AND ANAEROBIC Blood Culture adequate volume   Culture   Final    NO GROWTH 5 DAYS Performed at [facility], [address] [place], [place] [address]    Report Status 03/07/2023 FINAL  Final  Blood Culture (routine x 2)     Status: None   Collection Time: 03/02/23  6:12 AM   Specimen: BLOOD RIGHT HAND  Result Value Ref Range Status   Specimen Description BLOOD RIGHT HAND  Final   Special Requests   Final    BOTTLES DRAWN AEROBIC ONLY Blood Culture adequate volume   Culture   Final    NO GROWTH 5 DAYS Performed at [facility], [address], [place], [place] [address]    Report Status 03/07/2023 FINAL  Final  MRSA Next Gen by PCR, Nasal     Status: None   Collection Time: 03/03/23  4:35 PM   Specimen: Nasal Mucosa; Nasal Swab  Result Value Ref Range Status   MRSA by PCR Next Gen NOT DETECTED NOT DETECTED Final    Comment: (NOTE) The GeneXpert MRSA Assay (FDA approved for NASAL specimens only), is one component of a comprehensive MRSA colonization surveillance program. It is not intended to diagnose MRSA infection nor to guide or monitor treatment for MRSA infections. Test performance is not FDA approved in patients less than 69 years old. Performed at [facility], [address], [place], [place] [address]     FURTHER DISCHARGE INSTRUCTIONS:  Get Medicines reviewed and adjusted: Please take all your medications with you for your next visit with your Primary MD  Laboratory/radiological data: Please request your Primary  MD to go over all hospital tests and procedure/radiological results at the follow up, please ask your Primary MD to get all Hospital records sent to his/her office.  In some cases, they will be blood work, cultures and biopsy results pending at the time of your discharge. Please request that your primary care  M.D. goes through all the records of your hospital data and follows up on these results.  Also Note the following: If you experience worsening of your admission symptoms, develop shortness of breath, life threatening emergency, suicidal or homicidal thoughts you must seek medical attention immediately by calling 911 or calling your MD immediately  if symptoms less severe.  You must read complete instructions/literature along with all the possible adverse reactions/side effects for all the Medicines you take and that have been prescribed to you. Take any new Medicines after you have completely understood and accpet all the possible adverse reactions/side effects.   Do not drive when taking Pain medications or sleeping medications (Benzodaizepines)  Do not take more than prescribed Pain, Sleep and Anxiety Medications. It is not advisable to combine anxiety,sleep and pain medications without talking with your primary care practitioner  Special Instructions: If you have smoked or chewed Tobacco  in the last 2 yrs please stop smoking, stop any regular Alcohol  and or any Recreational drug use.  Wear Seat belts while driving.  Please note: You were cared for by a hospitalist during your hospital stay. Once you are discharged, your primary care physician will handle any further medical issues. Please note that NO REFILLS for any discharge medications will be authorized once you are discharged, as it is imperative that you return to your primary care physician (or establish a relationship with a primary care physician if you do not have one) for your post hospital discharge needs so that they can  reassess your need for medications and monitor your lab values.  Total Time spent coordinating discharge including counseling, education and face to face time equals greater than 30 minutes.  Signed: Javious Hallisey 03/07/2023 9:04 AM

## 2023-03-08 ENCOUNTER — Telehealth: Payer: Self-pay

## 2023-03-08 NOTE — Transitions of Care (Post Inpatient/ED Visit) (Signed)
   03/08/2023  Name: Erik [name] MRN: [id number] DOB: 08/14/1965  Today's TOC FU Call Status: Today's TOC FU Call Status:: Unsuccessful Call (1st Attempt) Unsuccessful Call (1st Attempt) Date: 03/08/23  Attempted to reach the patient regarding the most recent Inpatient/ED visit.  Follow Up Plan: Additional outreach attempts will be made to reach the patient to complete the Transitions of Care (Post Inpatient/ED visit) call.   Channing Larry, RN, BA, [facility], CRRN [facility] Coordinator, Transition of Care Ph # [phone]

## 2023-03-09 ENCOUNTER — Telehealth: Payer: Self-pay

## 2023-03-09 NOTE — Transitions of Care (Post Inpatient/ED Visit) (Signed)
   03/09/2023  Name: Andrzej Scully MRN: [id number] DOB: 09/28/1965  Today's TOC FU Call Status: Today's TOC FU Call Status:: Unsuccessful Call (2nd Attempt) Unsuccessful Call (2nd Attempt) Date: 03/09/23  Attempted to reach the patient regarding the most recent Inpatient/ED visit.  Follow Up Plan: Additional outreach attempts will be made to reach the patient to complete the Transitions of Care (Post Inpatient/ED visit) call.  Channing Larry, RN, BA, [facility], CRRN [facility] Coordinator, Transition of Care Ph # [phone]

## 2023-03-12 ENCOUNTER — Telehealth: Payer: Self-pay | Admitting: *Deleted

## 2023-03-12 ENCOUNTER — Telehealth: Payer: Self-pay | Admitting: Family

## 2023-03-12 NOTE — Transitions of Care (Post Inpatient/ED Visit) (Signed)
   03/12/2023  Name: Erik [name] MRN: [id number] DOB: [birth year]  Today's TOC FU Call Status: Today's TOC FU Call Status:: Successful TOC FU Call Completed TOC FU Call Complete Date: 03/12/23 Patient's Name and Date of Birth confirmed.  Transition Care Management Follow-up Telephone Call Date of Discharge: 03/07/23 Discharge Facility: [facility]) Type of Discharge: Inpatient Admission Primary Inpatient Discharge Diagnosis:: sepsis How have you been since you were released from the hospital?:  (Deceased [id number])   Hospice was started and patient expired [id number]. RN made patient wife aware of grief counseling through Hospice.     [name] BSN RN [facility] Health Care Management Coordinator [email or web address] Direct Dial: [phone]  Fax: [phone] Website: [email or web address]

## 2023-03-12 NOTE — Telephone Encounter (Signed)
 Erik [name]

## 2023-03-13 NOTE — Telephone Encounter (Signed)
Sent to Primary Care. Please keep all scheduled appointments.

## 2023-03-31 DEATH — deceased

## 2023-04-20 ENCOUNTER — Encounter: Payer: Self-pay | Admitting: Hematology and Oncology
